# Patient Record
Sex: Male | Born: 1945 | Race: Black or African American | Hispanic: No | Marital: Single | Smoking: Former smoker
Health system: Southern US, Community
[De-identification: ages and names within clinical notes are randomized; demographics above are authoritative.]

## PROBLEM LIST (undated history)

## (undated) DIAGNOSIS — I10 Essential (primary) hypertension: Secondary | ICD-10-CM

---

## 2013-11-19 ENCOUNTER — Inpatient Hospital Stay: Payer: Self-pay | Admitting: Internal Medicine

## 2013-11-19 LAB — COMPREHENSIVE METABOLIC PANEL
Albumin: 3.6 g/dL (ref 3.4–5.0)
Alkaline Phosphatase: 121 U/L — ABNORMAL HIGH
Anion Gap: 5 — ABNORMAL LOW (ref 7–16)
BUN: 7 mg/dL (ref 7–18)
Bilirubin,Total: 0.6 mg/dL (ref 0.2–1.0)
CALCIUM: 8.8 mg/dL (ref 8.5–10.1)
CHLORIDE: 106 mmol/L (ref 98–107)
CREATININE: 1.13 mg/dL (ref 0.60–1.30)
Co2: 29 mmol/L (ref 21–32)
EGFR (Non-African Amer.): >60
GLUCOSE: 118 mg/dL — AB (ref 65–99)
OSMOLALITY: 278 (ref 275–301)
Potassium: 5.4 mmol/L — ABNORMAL HIGH (ref 3.5–5.1)
SGOT(AST): 30 U/L (ref 15–37)
SGPT (ALT): 19 U/L
Sodium: 140 mmol/L (ref 136–145)
Total Protein: 8 g/dL (ref 6.4–8.2)

## 2013-11-19 LAB — CBC WITH DIFFERENTIAL/PLATELET
BASOS ABS: 0 10*3/uL (ref 0.0–0.1)
BASOS PCT: 0.4 %
EOS ABS: 0 10*3/uL (ref 0.0–0.7)
Eosinophil %: 0.2 %
HCT: 47.4 % (ref 40.0–52.0)
HGB: 15.3 g/dL (ref 13.0–18.0)
Lymphocyte #: 0.9 10*3/uL — ABNORMAL LOW (ref 1.0–3.6)
Lymphocyte %: 15.8 %
MCH: 31.3 pg (ref 26.0–34.0)
MCHC: 32.2 g/dL (ref 32.0–36.0)
MCV: 97 fL (ref 80–100)
Monocyte #: 0.6 x10 3/mm (ref 0.2–1.0)
Monocyte %: 11.5 %
NEUTROS ABS: 4 10*3/uL (ref 1.4–6.5)
NEUTROS PCT: 72.1 %
PLATELETS: 229 10*3/uL (ref 150–440)
RBC: 4.89 10*6/uL (ref 4.40–5.90)
RDW: 16.9 % — ABNORMAL HIGH (ref 11.5–14.5)
WBC: 5.5 10*3/uL (ref 3.8–10.6)

## 2013-11-19 LAB — URIC ACID: Uric Acid: 8.5 mg/dL — ABNORMAL HIGH (ref 3.5–7.2)

## 2013-11-19 LAB — SEDIMENTATION RATE: Erythrocyte Sed Rate: 5 mm/hr (ref 0–20)

## 2013-11-20 LAB — BASIC METABOLIC PANEL
Anion Gap: 8 (ref 7–16)
BUN: 8 mg/dL (ref 7–18)
Calcium, Total: 8.8 mg/dL (ref 8.5–10.1)
Chloride: 104 mmol/L (ref 98–107)
Co2: 28 mmol/L (ref 21–32)
Creatinine: 0.91 mg/dL (ref 0.60–1.30)
EGFR (Non-African Amer.): >60
Glucose: 83 mg/dL (ref 65–99)
Osmolality: 277 (ref 275–301)
Potassium: 4 mmol/L (ref 3.5–5.1)
SODIUM: 140 mmol/L (ref 136–145)

## 2013-11-20 LAB — CBC WITH DIFFERENTIAL/PLATELET
BASOS ABS: 0 10*3/uL (ref 0.0–0.1)
Basophil %: 0.7 %
Eosinophil #: 0 10*3/uL (ref 0.0–0.7)
Eosinophil %: 0.5 %
HCT: 44.6 % (ref 40.0–52.0)
HGB: 14.6 g/dL (ref 13.0–18.0)
Lymphocyte #: 1.6 10*3/uL (ref 1.0–3.6)
Lymphocyte %: 30.3 %
MCH: 32.1 pg (ref 26.0–34.0)
MCHC: 32.9 g/dL (ref 32.0–36.0)
MCV: 98 fL (ref 80–100)
Monocyte #: 0.7 x10 3/mm (ref 0.2–1.0)
Monocyte %: 13.5 %
NEUTROS ABS: 2.8 10*3/uL (ref 1.4–6.5)
Neutrophil %: 55 %
Platelet: 218 10*3/uL (ref 150–440)
RBC: 4.56 10*6/uL (ref 4.40–5.90)
RDW: 17.2 % — AB (ref 11.5–14.5)
WBC: 5.1 10*3/uL (ref 3.8–10.6)

## 2013-11-20 LAB — LIPID PANEL
CHOLESTEROL: 172 mg/dL (ref 0–200)
HDL: 70 mg/dL — AB (ref 40–60)
Ldl Cholesterol, Calc: 79 mg/dL (ref 0–100)
Triglycerides: 116 mg/dL (ref 0–200)
VLDL Cholesterol, Calc: 23 mg/dL (ref 5–40)

## 2013-11-20 LAB — HEMOGLOBIN A1C: Hemoglobin A1C: 5.2 % (ref 4.2–6.3)

## 2013-11-24 LAB — CREATININE, SERUM
Creatinine: 1.18 mg/dL (ref 0.60–1.30)
EGFR (African American): >60
EGFR (Non-African Amer.): >60

## 2014-05-09 NOTE — Op Note (Signed)
PATIENT NAME:  Tristan [name], Tristan [name] MR#:  [id number] DATE OF BIRTH:  1945/09/15  DATE OF PROCEDURE:  11/24/2013   PREOPERATIVE DIAGNOSES:  1.  Peripheral arterial disease with gangrene, right lower extremity. 2.  Hypertension.  POSTOPERATIVE DIAGNOSES:    1.  Peripheral arterial disease with gangrene, right lower extremity. 2.  Hypertension.  PROCEDURE:   1.  Ultrasound guidance for vascular access, left femoral artery.  2.  Catheter placement into right peroneal and posterior tibial arteries from left femoral approach.  3.  Aortogram and selective right lower extremity angiogram.  4.  Percutaneous transluminal angioplasty of right peroneal artery with 3 mm diameter angioplasty balloon.  4.  Percutaneous transluminal angioplasty of right posterior tibial artery with 3 mm diameter angioplasty balloon.  5.  Percutaneous transluminal angioplasty of tibioperoneal trunk and proximal peroneal artery with 4 mm diameter drug-coated angioplasty balloon.  6.  Percutaneous transluminal angioplasty of right distal superficial femoral artery and popliteal artery with 5 mm diameter drug-coated angioplasty balloon.  7.  Viabahn covered stent placement, 6 mm diameter x 25 cm in length for significant residual stenosis and dissection after angioplasty in the distal superficial femoral artery and popliteal artery.  8.  StarClose closure device, left femoral artery.   SURGEON: Algernon Huxley, MD.   ANESTHESIA: Local with moderate conscious sedation.   ESTIMATED BLOOD LOSS: 25 mL.   INDICATION FOR PROCEDURE: A 69 year old African American gentleman with gangrenous toes on his right foot. His clinical exam was consistent with peripheral arterial disease with diminished pulses. He is brought in for angiography, further evaluation and potential treatment. Risks and benefits were discussed. Informed consent was obtained.   DESCRIPTION OF PROCEDURE: The patient is brought to the vascular suite. Groins were shaved and  prepped and a sterile surgical field was created. The left femoral head is visualized with ultrasound.  The left femoral head was visualized with fluoroscopy. The left femoral artery was then visualized with ultrasound and found to be patent. It was then accessed under direct ultrasound guidance without difficulty with Seldinger needle and a permanent image was recorded.  A J-wire and 5 French sheath were then placed.  Pigtail catheter was placed in the aorta at the L1-L2 level and AP aortogram was performed. This showed what appeared to be good flow in the renal arteries. The aorta and iliac were diffusely calcified, but not stenotic.  I then crossed the aortic bifurcation and advanced to the right femoral head and selective right lower extremity angiogram was then performed. This showed about a 30% to 40% stenosis in the proximal superficial femoral artery that was not flow-limiting.  The mid superficial femoral artery was widely patent.  The distal superficial femoral artery was occluded and there was a medium length chronic total occlusion reconstituted in the popliteal artery above the knee.  Below this level, there was multiple areas of significant stenosis within the popliteal artery and the tibial vessels were quite diseased.  The patient was given 4000 units of intravenous heparin for systemic anticoagulation and a 6 French Ansel sheath was placed over a Terumo Advantage wire.  A Kumpe catheter and a CXI catheter were used to cross the occlusion in the SFA and popliteal arteries, and regain access into the tibioperoneal trunk, then used a 0.018 wire to cross the diseased areas initially in the peroneal artery and park a wire distally in the peroneal artery. The peroneal artery and tibioperoneal trunk were treated with 3 mm diameter angioplasty balloon.  I pretreated  the SFA and popliteal lesion with a 3 mm balloon as this was heavily calcific and the catheter did not cross easily. The SFA and popliteal  lesion were then treated with a 5 mm diameter Lutonix drug-coated angioplasty balloon and the tibioperoneal trunk was still heavily diseased and so a 4 mm diameter Lutonix drug-coated angioplasty balloon was used to treat the tibioperoneal trunk into proximal peroneal artery. Following this, there was still significant areas of stenosis and a spiral dissection within the distal SFA and above-knee popliteal artery and flow was not brisk distally.  The popliteal artery and SFA, I elected to cover these areas with covered stent, but I went ahead and got my 0.018 wire into the posterior tibial artery. This was the better runoff distally, it but had a near occlusive stenosis proximally and disease over the first 8 to 10 cm. I was able to cross this lesion in the posterior tibial artery and confirm intraluminal flow in the posterior tibial artery distally. I then replaced the 0.018 wire.  The 3 mm diameter angioplasty balloon was inflated in the mid and proximal posterior tibial artery up into the tibioperoneal trunk which there was a dissection after angioplasty but the flow was significantly improved.  To treat the popliteal and SFA residual stenosis and dissection, a 6 mm diameter x 25 cm Viabahn covered stent was used, post-dilated with a 5 mm and then a 6 mm balloon with good angiographic completion result and significantly improved flow. At this point, the patient's perfusion had been improved.  We treated 2 tibial vessels, as well as the occlusion in the SFA and popliteal artery and his flow was much better.  I elected to terminate the procedure.  The sheath was pulled back to the ipsilateral external iliac, and oblique arteriogram was performed.  StarClose closure device was deployed in the usual fashion with excellent hemostatic result. The patient tolerated the procedure well and was taken to the recovery room in stable condition.     ____________________________ Algernon Huxley, MD jsd:DT D: 11/24/2013  12:48:48 ET T: 11/24/2013 13:40:18 ET JOB#: [id number]  cc: Algernon Huxley, MD, <Dictator> Algernon Huxley MD ELECTRONICALLY SIGNED 12/01/2013 10:12

## 2014-05-09 NOTE — H&P (Signed)
PATIENT NAME:  Tristan [name], Tristan [name] MR#:  [id number] DATE OF BIRTH:  [birth year]  DATE OF ADMISSION:  11/19/2013  PRIMARY CARE PHYSICIAN: None.   CHIEF COMPLAINT: Pain in the toes.   HISTORY OF PRESENT ILLNESS: This is a 69 year old man with history of gout. He thought he had a gout attack and could hardly walk a couple of weeks ago, having pain in the feet. He has been taking as needed ibuprofen. His toes turned black about a week ago. He decided to come into the ER today; stated that he needed to pay some bills prior to getting evaluated for the black on the toes. In the ER, he was found to have a normal white count and he was afebrile. His blood pressure was found to be severely elevated 191/121. Hospitalist services were contacted for further evaluation.   PAST MEDICAL HISTORY: Possible gout, arthritis, and hip pain.   PAST SURGICAL HISTORY: Cyst on the head.   ALLERGIES: No known drug allergies.   MEDICATIONS: Include as needed ibuprofen.   SOCIAL HISTORY: No smoking. Does drink beer, 1 to 2 every other day. No drug use. He does live alone.   FAMILY HISTORY: Father, unknown medical history. Mother did not have many medical issues. Did have hypertension, and she did not eat, and she died of starvation.   REVIEW OF SYSTEMS:  CONSTITUTIONAL: Positive for chills. Positive for sweats. Positive for weight loss 30 pounds. No weakness or fatigue.  EYES: He does wear glasses.  EARS, NOSE, MOUTH AND THROAT: No hearing loss. No sore throat. No difficulty swallowing.  CARDIOVASCULAR: No chest pain. No palpitations.   RESPIRATORY: No shortness of breath. No cough. No sputum. No hemoptysis.  GASTROINTESTINAL: Positive for vomiting 3 weeks ago. No abdominal pain. No diarrhea. No constipation. No bright red blood per rectum. No melena.  GENITOURINARY: No burning on urination or hematuria.  MUSCULOSKELETAL: Positive for hip pain and toe pain.  INTEGUMENTARY: Positive for gangrene on the second toe and tip  of the right first toe.  NEUROLOGIC: No fainting or blackouts.  PSYCHIATRIC: No anxiety or depression.  ENDOCRINE: No thyroid problems.  HEMATOLOGIC AND LYMPHATIC: No anemia, no easy bruising or bleeding.   PHYSICAL EXAMINATION: VITAL SIGNS: Temperature 98.2, pulse 114, respirations 19, blood pressure 191/121, pulse oximetry 100% on room air.  GENERAL: No respiratory distress.  EYES: Conjunctivae and lids normal. Pupils are equal, round, and reactive to light. Extraocular muscles intact. No nystagmus.  EARS, NOSE, MOUTH AND THROAT: Tympanic membranes: No erythema. Nasal mucosa: No erythema. Throat: No erythema, no exudate seen. Lips and gums: No lesions.  NECK: No JVD. No bruits. No lymphadenopathy. No thyromegaly. No thyroid nodules palpated.  LUNGS: Clear to auscultation. No use of accessory muscles to breathe. No rhonchi, rales, or wheeze heard.  CARDIOVASCULAR: S1, S2 normal. No gallops, rubs, or murmurs heard. Carotid upstroke 2+ bilaterally. No bruits. Dorsalis pedis unable to palpate.  ABDOMEN: Soft, nontender. No organomegaly/splenomegaly. Normoactive bowel sounds. No masses felt.  LYMPHATIC: No lymph nodes in the neck.  MUSCULOSKELETAL: No clubbing, edema, cyanosis.  SKIN: Gangrene, top of the second toe and the tip of the first toe. No other lesions seen.  NEUROLOGIC: Cranial nerves II through XII grossly intact. Deep tendon reflexes 1+ bilateral lower extremities.  PSYCHIATRIC: The patient is oriented to person, place, and time.   LABORATORY AND RADIOLOGICAL DATA: Foot x-Alekai shows incomplete fracture along the lateral aspect of the proximal portion of the left first proximal phalanx. No other fracture.  No dislocation.  Multiple erosions on the first MTP joint consistent with gout. Osteoarthritic change in the PIP and DIP joints. White blood cell count 5.5, H and H 15.3 and 47.4, platelet count of 229,000, glucose 118, BUN 7, creatinine 1.13, sodium 140, potassium 5.4, chloride 106,  CO2 of 29, calcium 8.8, alkaline phosphatase 121, ALT 19, AST 30, total protein 8, albumin 3.6. EKG:  First EKG showed sinus tachycardia, left atrial enlargement.  Next EKG: Normal sinus rhythm, 85 beats per minute.   ASSESSMENT AND PLAN: 1.  Gangrene on the top of the second toe and tip of the first toe with peripheral vascular disease. Unable to palpate pulses.  I will check an ESR, obtain podiatry consultation for possible debridement versus amputation. I will obtain a vascular surgery consultation to assess circulation with possible angiogram. We will hold off on anti-inflammatories at this point.  2.  Accelerated hypertension.  A stat dose of hydrochlorothiazide was ordered in the ER. Will continue that on a daily basis and along with a low dose metoprolol and continue to monitor blood pressure.  Blood pressure has been elevated for a long time without medication so I do not want to bring it down too quickly.  3.  History of gout with gout showing on the x-Akeen. I will give a dose of colchicine stat and see if that makes a difference.  4.  History of arthritis. Will send off a rheumatoid factor.  5.  Hyperkalemia. We will give a liter of fluid and recheck in a.m.  6.  Impaired fasting glucose. We will check hemoglobin A1c in the a.m. and will also put on sliding scale for right now and check sugars a little more closely.   TIME SPENT ON ADMISSION: 55 minutes.   ER physician spoke with Dr. Cleda Mccreedy, podiatry. I spoke with Dr. Nino Parsley, vascular surgeon, nurse for consultation.    ____________________________ Tana Conch. Leslye Peer, MD rjw:jp D: 11/19/2013 14:54:34 ET T: 11/19/2013 15:18:04 ET JOB#: [id number]  cc: Tana Conch. Leslye Peer, MD, <Dictator> Marisue Brooklyn MD ELECTRONICALLY SIGNED 11/27/2013 1:36

## 2014-05-09 NOTE — Discharge Summary (Signed)
PATIENT NAME:  Tristan [name], FOTI MR#:  [id number] DATE OF BIRTH:  14-Sep-1945  DATE OF ADMISSION:  11/19/2013 DATE OF DISCHARGE:    PRIMARY CARE PHYSICIAN:  Non-local.    DISCHARGE DIAGNOSES:   1.  Peripheral arterial disease right lower extremity, gangrene.   2.  Hypertension.   CONDITION: Stable.   CODE STATUS: Full code.   PROCEDURE:   Abdominal aortogram with stent placement.  HOME MEDICATIONS: Please refer to the medication reconciliation list.   DIET: Low-sodium, low-fat, low-cholesterol diet.   ACTIVITY: As tolerated.   FOLLOWUP CARE: Follow with PCP within 1-2 weeks. Follow up with Dr. Lucky Cowboy within 2-4  weeks for ABI.   REASON FOR ADMISSION: Pain in the toes.     HOSPITAL COURSE: The patient is a 69 year old African-American male with a history of gout, arthritis, and hip pain, who presented to the ED with pain in toes. In addition the patient's toes became black for 1 week. The patient was found to have a high blood pressure at 191/121 in the ed. For detailed history and physical examination please refer to the admission note dictated by Dr. Leslye Peer. On admission date the patient's foot x-Alann showed incomplete fracture along the lateral aspect of the proximal portion of the left first proximal phalanx. The patient's WBC 5.5, hemoglobin 15.3.  After admission for the gangrene on the top of the second toe and tip of the first toe with PAD, since the patient had no pedal pulses Dr. Leslye Peer requested vascular surgery consult and podiatry consult. Dr. Delana Meyer suggested angiogram, which was done today by Dr. Lucky Cowboy.  Dr. Lucky Cowboy put a stent on the right lower extremity due to PAD, he suggested to continue aspirin and add Plavix follow up him as outpatient The patient has been treated with aspirin for PAD.   Hypertension. The patient has been treated with Lopressor, HCTZ-lisinopril in the hospital, blood pressure has been controlled.   The patient has no complaints. He is clinically stable and he  will be discharged home today. I discussed the patient's discharge plan with the patient, nurse, case manager, and Dr. Lucky Cowboy.   TIME SPENT: About 38 minutes.    ____________________________ Demetrios Loll, MD qc:bu D: 11/24/2013 13:16:20 ET T: 11/24/2013 14:14:00 ET JOB#: [id number]  cc: Demetrios Loll, MD, <Dictator> Demetrios Loll MD ELECTRONICALLY SIGNED 11/24/2013 16:58

## 2014-05-09 NOTE — Consult Note (Signed)
General Aspect gangrene of the right second toe   Present Illness The patient is a 69 year old man with history of gout. He thought he had a gout attack and could hardly walk several weeks ago.  This was associated with severe pain in his right foot. His right second toes turned black about a week ago.  In the ER, he was found to have a normal white count and he was afebrile. His blood pressure was found to be severely elevated 191/121. He states that in addition to the trouble with gout he knows he has blockages in his legs.  He notes he has never had any treatments for his PAD.  PAST MEDICAL HISTORY: Possible gout, arthritis, and hip pain.   PAST SURGICAL HISTORY: Cyst on the head.   Home Medications: Medication Instructions Status  ibuprofen 200 mg oral tablet 1 tab(s) orally every 6 hours, As Needed Active    No Known Allergies:   Case History:  Family History Non-Contributory   Social History negative tobacco, positive ETOH, negative Illicit drugs   Review of Systems:  Fever/Chills No   Cough No   Sputum No   Abdominal Pain No   Diarrhea No   Constipation No   Nausea/Vomiting No   SOB/DOE No   Chest Pain No   Telemetry Reviewed NSR   Dysuria No   Physical Exam:  GEN well developed, well nourished, no acute distress   HEENT hearing intact to voice, moist oral mucosa   NECK supple  trachea midline   RESP normal resp effort  no use of accessory muscles   CARD regular rate  no JVD   ABD denies tenderness  soft   EXTR negative edema, pedal and popliteal pulses are not palpable, feet are cool with slugish cap refill and gangrene of the 2nd toe   SKIN No rashes, positive ulcers   NEURO cranial nerves intact, follows commands   PSYCH alert, A+O to time, place, person   Nursing/Ancillary Notes: **Vital Signs.:   04-Nov-15 19:52  Vital Signs Type Q 4hr  Temperature Temperature (F) 98.7  Celsius 37  Temperature Source oral  Pulse Pulse 100   Respirations Respirations 18  Systolic BP Systolic BP 161  Diastolic BP (mmHg) Diastolic BP (mmHg) 94  Mean BP 124  Pulse Ox % Pulse Ox % 98  Pulse Ox Activity Level  At rest  Oxygen Delivery Room Air/ 21 %   Hepatic:  04-Nov-15 12:47   Bilirubin, Total 0.6  Alkaline Phosphatase  121 (46-116 NOTE: New Reference Range 08/05/13)  SGPT (ALT) 19 (14-63 NOTE: New Reference Range 08/05/13)  SGOT (AST) 30  Total Protein, Serum 8.0  Albumin, Serum 3.6  Routine Chem:  04-Nov-15 12:47   Uric Acid, Serum  8.5 (Result(s) reported on 19 Nov 2013 at 03:15PM.)  Glucose, Serum  118  BUN 7  Creatinine (comp) 1.13  Sodium, Serum 140  Potassium, Serum  5.4  Chloride, Serum 106  CO2, Serum 29  Calcium (Total), Serum 8.8  Osmolality (calc) 278  eGFR (African American) >60  eGFR (Non-African American) >60 (eGFR values <11m/min/1.73 m2 may be an indication of chronic kidney disease (CKD). Calculated eGFR, using the MRDR Study equation, is useful in  patients with stable renal function. The eGFR calculation will not be reliable in acutely ill patients when serum creatinine is changing rapidly. It is not useful in patients on dialysis. The eGFR calculation may not be applicable to patients at the low and high extremes of  body sizes, pregnant women, and vegetarians.)  Anion Gap  5  Routine Hem:  04-Nov-15 12:47   Erythrocyte Sed Rate 5 (Result(s) reported on 19 Nov 2013 at 03:49PM.)  WBC (CBC) 5.5  RBC (CBC) 4.89  Hemoglobin (CBC) 15.3  Hematocrit (CBC) 47.4  Platelet Count (CBC) 229  MCV 97  MCH 31.3  MCHC 32.2  RDW  16.9  Neutrophil % 72.1  Lymphocyte % 15.8  Monocyte % 11.5  Eosinophil % 0.2  Basophil % 0.4  Neutrophil # 4.0  Lymphocyte #  0.9  Monocyte # 0.6  Eosinophil # 0.0  Basophil # 0.0 (Result(s) reported on 19 Nov 2013 at 01:20PM.)   XRay:    04-Nov-15 13:26, Foot Right Complete  Foot Right Complete   REASON FOR EXAM:    2nd digit decrotic tissue  COMMENTS:        PROCEDURE: DXR - DXR FOOT RT COMPLETE W/OBLIQUES  - Nov 19 2013  1:26PM     CLINICAL DATA:  Patient with history of gout; trauma to first toe    EXAM:  RIGHT FOOT COMPLETE - 3+ VIEW    COMPARISON:  None.    FINDINGS:  Frontal, oblique, and lateral views were obtained. There is  extensive erosion in the first MTP joint consistent with known gout.  There is an incomplete fracture along the proximal portion of the  lateral aspect of the first proximal phalanx. No other evidence of  fracture. No dislocation there is moderate narrowing of all PIP and  DIP joints. Bones are osteoporotic.     IMPRESSION:  Incomplete fracture along the lateral aspect of the proximal portion  of the first proximal phalanx. No other fracture. No dislocation.  Multiple erosions first MTP joint consistent with gout.  Osteoarthritic change in all PIP and DIP joints.      Electronically Signed    By: Lowella Grip M.D.    On: 11/19/2013 13:33     Verified By: Leafy Kindle. WOODRUFF, M.D.,    Impression 1.  Gangrene on the top of the second toe and tip of the first toe with peripheral vascular disease.  The patient has tissue loss and evidence of ASO bilaterally.  He shuld have an angiogram for revascularization to prevent loss of his leg.  The risks and benfits were discussed and he agrees to proceed. Plans will be made for angiogram on Friday. 2.  Accelerated hypertension.  A stat dose of hydrochlorothiazide was ordered in the ER. Will continue that on a daily basis and along with a low dose metoprolol and continue to monitor blood pressure.  Blood pressure has been elevated for a long time without medication so I do not want to bring it down too quickly.  3.  History of gout with gout showing on the x-Karla. I will give a dose of colchicine stat and see if that makes a difference.  4.  History of arthritis. Will send off a rheumatoid factor.  5.  Hyperkalemia. We will give a liter of fluid and recheck in  a.m.  6.  Impaired fasting glucose. We will check hemoglobin A1c in the a.m. and will also put on sliding scale for right now and check sugars a little more closely.   Plan level 4 consult   Electronic Signatures: Hortencia Pilar (MD)  (Signed [phone] 22:33)  Authored: General Aspect/Present Illness, Home Medications, Allergies, History and Physical Exam, Vital Signs, Labs, Radiology, Impression/Plan   Last Updated: 04-Nov-15 22:33 by Hortencia Pilar (MD)

## 2014-05-09 NOTE — Consult Note (Signed)
PATIENT NAME:  Tristan [name], Tristan [name] MR#:  [id number] DATE OF BIRTH:  December 19, 1945  DATE OF CONSULTATION:  11/19/2013  CONSULTING PHYSICIAN:  Sharlotte Alamo, DPM  REASON FOR CONSULTATION:  This is a 69 year old man with a recent history of some pain and discoloration in his right foot, specifically out into the first and second toes. A couple of weeks ago, he thought he was having a gout attack because he could hardly walk and then about a week ago started to notice some discoloration on his first and second toes. He denies any specific injury to the toes. He does not relate any new pair of shoes or other incident that may have started this. He did present to the Emergency Department earlier today, where he was diagnosed with some gangrenous toes. On admission, he was found to have a normal white count, and he was afebrile.   PAST MEDICAL HISTORY:  Arthritis, hip pain, gout.   PAST SURGICAL HISTORY:  Tonsillectomy as a child, cyst on his head.   MEDICATIONS:  Ibuprofen as needed.   ALLERGIES:  Denies any drug allergies.   SOCIAL HISTORY:  He does drink some beer 1 to 2 every other day. Denies tobacco use. He lives alone. Denies drug use.   FAMILY HISTORY:  Father's history is unknown. Mother did have hypertension.   REVIEW OF SYSTEMS:   CONSTITUTIONAL:  He does relate some chills and sweats. Significant weight loss recently.  EYES:  Denies blurry or double vision. He does wear glasses.  EARS, NOSE, AND THROAT:  Denies hearing loss, tinnitus, or nasal congestion.  RESPIRATORY:  Denies cough or shortness of breath.  CARDIOVASCULAR:  No chest pains or palpitations.  GASTROINTESTINAL:  No current nausea or vomiting. Denies abdominal pain.  GENITOURINARY:  No dysuria or incontinence.  MUSCULOSKELETAL:  Pain in his right foot. Also has some chronic hip pain.  INTEGUMENT:  Discoloration on his right first and second toes. Denies any injury.  NEUROLOGICAL:  Denies any numbness or paresthesias.   PHYSICAL  EXAMINATION: VASCULAR:  DP and PT pulses are faintly palpable and thready. PT pulse is also faintly palpable but becomes absent after a couple of seconds; it is also thready in nature. Capillary filling time is still intact to the digits.  NEUROLOGICAL:  Protective threshold is intact bilaterally as well as proprioception.  INTEGUMENT:  Skin is warm, dry, and mildly atrophic. There is scant hair growth on the lesser digits. A full-thickness ulceration is present on the dorsal aspect of the right second toe with some underlying fibrotic tissue. No expressible purulence is noted. No ascending cellulitis. Some skin slough is noted dorsally and distally around the tip of the toe. An intact hemorrhagic lesion is noted at the tip of the hallux with no evidence of any abscess or fluid.  MUSCULOSKELETAL:  Adequate range of motion of the pedal joints. Muscle testing is 5/5. There is some pain on any palpation around the second toe.   X-RAYS:  Multiple views of the right foot revealed arthritic changes at the great toe joint consistent with gouty erosions. There also appeared to be a fracture line noted in the proximal phalanx. This was only clearly noted on one view. No clear evidence of any bony destruction or erosions in the second toe.   LABORATORY DATA:  White count 5.5, well in the normal range. No evidence of a left shift at this point.   IMPRESSION:  1.  A full-thickness ulceration, right second toe.  2.  Possibly  some degree of vascular compromise.  3.  Intact hemorrhagic lesion, distal hallux, right.   PLAN:  Excisional debridement of the devitalized tissue on the second toe sharply using a 10 blade and tissue nippers. Polysporin and a sterile bandage were applied. We will wait for his vascular evaluation, as he most likely has some degree of vascular compromise. At this point, it does not appear to be clinically infected, and an attempt at local wound care and possible revascularization if needed  could be tried. At this point, it does not specifically look gangrenous, and I do not feel that we need an emergent type of amputation. We will follow the patient accordingly during his hospital stay and wait for Vascular's assessment.    ____________________________ Sharlotte Alamo, DPM tc:nb D: 11/19/2013 21:36:35 ET T: 11/19/2013 21:59:45 ET JOB#: [id number]  cc: Sharlotte Alamo, DPM, <Dictator> Ellicia Alix DPM ELECTRONICALLY SIGNED 11/21/2013 13:57

## 2016-06-08 ENCOUNTER — Encounter: Payer: Self-pay | Admitting: Emergency Medicine

## 2016-06-08 ENCOUNTER — Inpatient Hospital Stay
Admission: EM | Admit: 2016-06-08 | Discharge: 2016-06-12 | DRG: 280 | Disposition: A | Discharge status: Skilled Nursing Facility | Payer: Medicare Other | Attending: Internal Medicine | Admitting: Internal Medicine

## 2016-06-08 ENCOUNTER — Inpatient Hospital Stay: Payer: Medicare Other

## 2016-06-08 ENCOUNTER — Emergency Department: Payer: Medicare Other

## 2016-06-08 DIAGNOSIS — C3431 Malignant neoplasm of lower lobe, right bronchus or lung: Secondary | ICD-10-CM

## 2016-06-08 DIAGNOSIS — M109 Gout, unspecified: Secondary | ICD-10-CM | POA: Diagnosis not present

## 2016-06-08 DIAGNOSIS — K7200 Acute and subacute hepatic failure without coma: Secondary | ICD-10-CM | POA: Diagnosis present

## 2016-06-08 DIAGNOSIS — R5383 Other fatigue: Secondary | ICD-10-CM | POA: Diagnosis not present

## 2016-06-08 DIAGNOSIS — D649 Anemia, unspecified: Secondary | ICD-10-CM

## 2016-06-08 DIAGNOSIS — N289 Disorder of kidney and ureter, unspecified: Secondary | ICD-10-CM | POA: Diagnosis not present

## 2016-06-08 DIAGNOSIS — R350 Frequency of micturition: Secondary | ICD-10-CM | POA: Diagnosis not present

## 2016-06-08 DIAGNOSIS — R627 Adult failure to thrive: Secondary | ICD-10-CM | POA: Diagnosis not present

## 2016-06-08 DIAGNOSIS — E119 Type 2 diabetes mellitus without complications: Secondary | ICD-10-CM | POA: Diagnosis not present

## 2016-06-08 DIAGNOSIS — I5020 Unspecified systolic (congestive) heart failure: Secondary | ICD-10-CM | POA: Diagnosis present

## 2016-06-08 DIAGNOSIS — E785 Hyperlipidemia, unspecified: Secondary | ICD-10-CM | POA: Diagnosis not present

## 2016-06-08 DIAGNOSIS — R7989 Other specified abnormal findings of blood chemistry: Secondary | ICD-10-CM | POA: Diagnosis not present

## 2016-06-08 DIAGNOSIS — I110 Hypertensive heart disease with heart failure: Secondary | ICD-10-CM | POA: Diagnosis present

## 2016-06-08 DIAGNOSIS — Z79899 Other long term (current) drug therapy: Secondary | ICD-10-CM | POA: Diagnosis not present

## 2016-06-08 DIAGNOSIS — I429 Cardiomyopathy, unspecified: Secondary | ICD-10-CM | POA: Diagnosis present

## 2016-06-08 DIAGNOSIS — I214 Non-ST elevation (NSTEMI) myocardial infarction: Secondary | ICD-10-CM | POA: Diagnosis not present

## 2016-06-08 DIAGNOSIS — R1310 Dysphagia, unspecified: Secondary | ICD-10-CM

## 2016-06-08 DIAGNOSIS — J9600 Acute respiratory failure, unspecified whether with hypoxia or hypercapnia: Secondary | ICD-10-CM

## 2016-06-08 DIAGNOSIS — E860 Dehydration: Secondary | ICD-10-CM | POA: Diagnosis present

## 2016-06-08 DIAGNOSIS — R571 Hypovolemic shock: Secondary | ICD-10-CM | POA: Diagnosis present

## 2016-06-08 DIAGNOSIS — R0602 Shortness of breath: Secondary | ICD-10-CM | POA: Diagnosis not present

## 2016-06-08 DIAGNOSIS — S36119A Unspecified injury of liver, initial encounter: Secondary | ICD-10-CM

## 2016-06-08 DIAGNOSIS — Z87891 Personal history of nicotine dependence: Secondary | ICD-10-CM

## 2016-06-08 DIAGNOSIS — R110 Nausea: Secondary | ICD-10-CM | POA: Diagnosis not present

## 2016-06-08 DIAGNOSIS — N390 Urinary tract infection, site not specified: Secondary | ICD-10-CM | POA: Diagnosis present

## 2016-06-08 DIAGNOSIS — D509 Iron deficiency anemia, unspecified: Secondary | ICD-10-CM | POA: Diagnosis present

## 2016-06-08 DIAGNOSIS — R944 Abnormal results of kidney function studies: Secondary | ICD-10-CM | POA: Diagnosis not present

## 2016-06-08 DIAGNOSIS — F5089 Other specified eating disorder: Secondary | ICD-10-CM | POA: Diagnosis not present

## 2016-06-08 DIAGNOSIS — T68XXXA Hypothermia, initial encounter: Secondary | ICD-10-CM

## 2016-06-08 DIAGNOSIS — Z923 Personal history of irradiation: Secondary | ICD-10-CM

## 2016-06-08 DIAGNOSIS — N179 Acute kidney failure, unspecified: Secondary | ICD-10-CM

## 2016-06-08 DIAGNOSIS — D720 Genetic anomalies of leukocytes: Secondary | ICD-10-CM | POA: Diagnosis not present

## 2016-06-08 DIAGNOSIS — I10 Essential (primary) hypertension: Secondary | ICD-10-CM

## 2016-06-08 DIAGNOSIS — Z8042 Family history of malignant neoplasm of prostate: Secondary | ICD-10-CM

## 2016-06-08 DIAGNOSIS — R748 Abnormal levels of other serum enzymes: Secondary | ICD-10-CM

## 2016-06-08 DIAGNOSIS — I2510 Atherosclerotic heart disease of native coronary artery without angina pectoris: Secondary | ICD-10-CM

## 2016-06-08 DIAGNOSIS — M8448XS Pathological fracture, other site, sequela: Secondary | ICD-10-CM | POA: Diagnosis not present

## 2016-06-08 DIAGNOSIS — Z8673 Personal history of transient ischemic attack (TIA), and cerebral infarction without residual deficits: Secondary | ICD-10-CM

## 2016-06-08 DIAGNOSIS — Z7982 Long term (current) use of aspirin: Secondary | ICD-10-CM | POA: Diagnosis not present

## 2016-06-08 DIAGNOSIS — I350 Nonrheumatic aortic (valve) stenosis: Secondary | ICD-10-CM | POA: Diagnosis not present

## 2016-06-08 HISTORY — DX: Essential (primary) hypertension: I10

## 2016-06-08 LAB — LACTATE DEHYDROGENASE: LDH: 1357 U/L — ABNORMAL HIGH (ref 98–192)

## 2016-06-08 LAB — VITAMIN B12: Vitamin B-12: 1122 pg/mL — ABNORMAL HIGH (ref 180–914)

## 2016-06-08 LAB — COMPREHENSIVE METABOLIC PANEL
ALT: 472 U/L — ABNORMAL HIGH (ref 17–63)
ANION GAP: 24 — AB (ref 5–15)
AST: 654 U/L — AB (ref 15–41)
Albumin: 3.8 g/dL (ref 3.5–5.0)
Alkaline Phosphatase: 67 U/L (ref 38–126)
BUN: 43 mg/dL — AB (ref 6–20)
CHLORIDE: 104 mmol/L (ref 101–111)
CO2: 7 mmol/L — ABNORMAL LOW (ref 22–32)
Calcium: 9 mg/dL (ref 8.9–10.3)
Creatinine, Ser: 2.8 mg/dL — ABNORMAL HIGH (ref 0.61–1.24)
GFR calc Af Amer: 25 mL/min — ABNORMAL LOW (ref >60)
GFR, EST NON AFRICAN AMERICAN: 21 mL/min — AB (ref >60)
Glucose, Bld: 184 mg/dL — ABNORMAL HIGH (ref 65–99)
POTASSIUM: 5.1 mmol/L (ref 3.5–5.1)
Sodium: 135 mmol/L (ref 135–145)
Total Bilirubin: 2.5 mg/dL — ABNORMAL HIGH (ref 0.3–1.2)
Total Protein: 7.8 g/dL (ref 6.5–8.1)

## 2016-06-08 LAB — CBC WITH DIFFERENTIAL/PLATELET
BASOS ABS: 0.1 10*3/uL (ref 0–0.1)
BASOS PCT: 1 %
EOS PCT: 0 %
Eosinophils Absolute: 0 10*3/uL (ref 0–0.7)
HCT: 15.3 % — ABNORMAL LOW (ref 40.0–52.0)
HEMOGLOBIN: 3.7 g/dL — AB (ref 13.0–18.0)
LYMPHS ABS: 0.9 10*3/uL — AB (ref 1.0–3.6)
Lymphocytes Relative: 6 %
MCH: 15 pg — ABNORMAL LOW (ref 26.0–34.0)
MCHC: 24.4 g/dL — AB (ref 32.0–36.0)
MCV: 61.6 fL — ABNORMAL LOW (ref 80.0–100.0)
MONOS PCT: 8 %
Monocytes Absolute: 1.2 10*3/uL — ABNORMAL HIGH (ref 0.2–1.0)
NEUTROS PCT: 85 %
Neutro Abs: 12.6 10*3/uL — ABNORMAL HIGH (ref 1.4–6.5)
Platelets: 425 10*3/uL (ref 150–440)
RBC: 2.49 MIL/uL — ABNORMAL LOW (ref 4.40–5.90)
RDW: 23.7 % — ABNORMAL HIGH (ref 11.5–14.5)
WBC: 14.8 10*3/uL — ABNORMAL HIGH (ref 3.8–10.6)
nRBC: 8 /100 WBC — ABNORMAL HIGH

## 2016-06-08 LAB — HEMOGLOBIN AND HEMATOCRIT, BLOOD
HCT: 21 % — ABNORMAL LOW (ref 40.0–52.0)
HCT: 24.9 % — ABNORMAL LOW (ref 40.0–52.0)
HEMOGLOBIN: 5.8 g/dL — AB (ref 13.0–18.0)
Hemoglobin: 7.6 g/dL — ABNORMAL LOW (ref 13.0–18.0)

## 2016-06-08 LAB — URIC ACID: Uric Acid, Serum: 15.4 mg/dL — ABNORMAL HIGH (ref 4.4–7.6)

## 2016-06-08 LAB — URINALYSIS, COMPLETE (UACMP) WITH MICROSCOPIC
BILIRUBIN URINE: NEGATIVE
Glucose, UA: NEGATIVE mg/dL
KETONES UR: NEGATIVE mg/dL
Nitrite: NEGATIVE
PROTEIN: 30 mg/dL — AB
Specific Gravity, Urine: 1.009 (ref 1.005–1.030)
pH: 5 (ref 5.0–8.0)

## 2016-06-08 LAB — IRON AND TIBC
Iron: 22 ug/dL — ABNORMAL LOW (ref 45–182)
Saturation Ratios: 5 % — ABNORMAL LOW (ref 17.9–39.5)
TIBC: 428 ug/dL (ref 250–450)
UIBC: 406 ug/dL

## 2016-06-08 LAB — PATHOLOGIST SMEAR REVIEW

## 2016-06-08 LAB — RAPID HIV SCREEN (HIV 1/2 AB+AG)
HIV 1/2 Antibodies: NONREACTIVE
HIV-1 P24 Antigen - HIV24: NONREACTIVE

## 2016-06-08 LAB — LACTIC ACID, PLASMA
Lactic Acid, Venous: 14.5 mmol/L (ref 0.5–1.9)
Lactic Acid, Venous: 13.7 mmol/L (ref 0.5–1.9)

## 2016-06-08 LAB — MRSA PCR SCREENING: MRSA by PCR: NEGATIVE

## 2016-06-08 LAB — FOLATE: Folate: 20.8 ng/mL (ref >5.9)

## 2016-06-08 LAB — TSH: TSH: 3.181 u[IU]/mL (ref 0.350–4.500)

## 2016-06-08 LAB — GLUCOSE, CAPILLARY: Glucose-Capillary: 102 mg/dL — ABNORMAL HIGH (ref 65–99)

## 2016-06-08 LAB — PREPARE RBC (CROSSMATCH)

## 2016-06-08 LAB — APTT: aPTT: 40 seconds — ABNORMAL HIGH (ref 24–36)

## 2016-06-08 LAB — PROTIME-INR
INR: 1.68
Prothrombin Time: 20 seconds — ABNORMAL HIGH (ref 11.4–15.2)

## 2016-06-08 LAB — BRAIN NATRIURETIC PEPTIDE: B Natriuretic Peptide: 3078 pg/mL — ABNORMAL HIGH (ref 0.0–100.0)

## 2016-06-08 LAB — FERRITIN: Ferritin: 7 ng/mL — ABNORMAL LOW (ref 24–336)

## 2016-06-08 LAB — TROPONIN I: Troponin I: 13.32 ng/mL (ref <0.03)

## 2016-06-08 LAB — ABO/RH: ABO/RH(D): B POS

## 2016-06-08 LAB — ETHANOL: Alcohol, Ethyl (B): <5 mg/dL (ref <5)

## 2016-06-08 LAB — LIPASE, BLOOD: LIPASE: 31 U/L (ref 11–51)

## 2016-06-08 MED ORDER — IPRATROPIUM-ALBUTEROL 0.5-2.5 (3) MG/3ML IN SOLN: 3.0000 mL | RESPIRATORY_TRACT | Status: DC | PRN

## 2016-06-08 MED ORDER — SODIUM CHLORIDE 0.9 % IV SOLN
INTRAVENOUS | Status: DC
  Administered 2016-06-08: 15:00:00 via INTRAVENOUS

## 2016-06-08 MED ORDER — SODIUM CHLORIDE 0.9 % IV SOLN
10.0000 mL/h | Freq: Once | INTRAVENOUS | Status: AC
  Administered 2016-06-08: 10 mL/h via INTRAVENOUS

## 2016-06-08 MED ORDER — NITROGLYCERIN IN D5W 200-5 MCG/ML-% IV SOLN
5.0000 ug/min | INTRAVENOUS | Status: DC
  Administered 2016-06-08: 5 ug/min via INTRAVENOUS
  Filled 2016-06-08: qty 250

## 2016-06-08 MED ORDER — BUDESONIDE 0.5 MG/2ML IN SUSP
0.5000 mg | Freq: Two times a day (BID) | RESPIRATORY_TRACT | Status: DC
  Administered 2016-06-09 – 2016-06-11 (×6): 0.5 mg via RESPIRATORY_TRACT
  Filled 2016-06-08 (×6): qty 2

## 2016-06-08 MED ORDER — METHYLPREDNISOLONE SODIUM SUCC 40 MG IJ SOLR
40.0000 mg | Freq: Two times a day (BID) | INTRAMUSCULAR | Status: DC
  Administered 2016-06-08 – 2016-06-10 (×5): 40 mg via INTRAVENOUS
  Filled 2016-06-08 (×5): qty 1

## 2016-06-08 MED ORDER — DOCUSATE SODIUM 100 MG PO CAPS: 100.0000 mg | ORAL_CAPSULE | Freq: Two times a day (BID) | ORAL | Status: DC | PRN

## 2016-06-08 MED ORDER — IPRATROPIUM-ALBUTEROL 0.5-2.5 (3) MG/3ML IN SOLN
3.0000 mL | Freq: Four times a day (QID) | RESPIRATORY_TRACT | Status: DC | PRN
  Administered 2016-06-08: 3 mL via RESPIRATORY_TRACT
  Filled 2016-06-08: qty 3

## 2016-06-08 MED ORDER — FUROSEMIDE 10 MG/ML IJ SOLN
40.0000 mg | Freq: Once | INTRAMUSCULAR | Status: AC
  Administered 2016-06-08: 40 mg via INTRAVENOUS
  Filled 2016-06-08: qty 4

## 2016-06-08 MED ORDER — SODIUM CHLORIDE 0.9 % IV BOLUS (SEPSIS)
1000.0000 mL | Freq: Once | INTRAVENOUS | Status: AC
  Administered 2016-06-08: 1000 mL via INTRAVENOUS

## 2016-06-08 MED ORDER — PANTOPRAZOLE SODIUM 40 MG IV SOLR
40.0000 mg | Freq: Two times a day (BID) | INTRAVENOUS | Status: DC
  Administered 2016-06-08 – 2016-06-11 (×7): 40 mg via INTRAVENOUS
  Filled 2016-06-08 (×7): qty 40

## 2016-06-08 MED ORDER — SODIUM CHLORIDE 0.9 % IV SOLN
Freq: Once | INTRAVENOUS | Status: AC
  Administered 2016-06-08: 16:00:00 via INTRAVENOUS

## 2016-06-08 MED ORDER — SODIUM BICARBONATE 8.4 % IV SOLN
INTRAVENOUS | Status: DC
  Administered 2016-06-08 – 2016-06-10 (×4): via INTRAVENOUS
  Filled 2016-06-08 (×4): qty 150

## 2016-06-08 MED ORDER — METHYLPREDNISOLONE SODIUM SUCC 125 MG IJ SOLR
60.0000 mg | Freq: Once | INTRAMUSCULAR | Status: AC
  Administered 2016-06-08: 60 mg via INTRAVENOUS
  Filled 2016-06-08: qty 2

## 2016-06-08 NOTE — Consult Note (Signed)
[facility] Consultation      Assessment and Plan:  71 year old male who does not seek regular medical care, now presents with severe symptomatic anemia.  Severe symptomatic anemia with associated acute kidney injury, acute lactic acidosis, hypothermia. -Transfuse. -Monitor fluid status. -Continue IV Protonix, the patient will require gastroenterology consultation some point for  colonoscopy in order to further workup the patient's anemia. -Warming blanket, monitor temperature.  NSTEMI -Likely secondary to severe anemia. -Cardiology following, further workup once the patient is more stable with possible cardiac catheterization. -Currently on nitroglycerin drip.     Date: 06/08/2016  MRN# [id number] Tristan [name] 1945-09-02  Referring Physician: Dr. Anselm Jungling for anemia, dizziness, hypothermia.   Tristan [name] is a 71 y.o. old male seen in consultation for chief complaint of:    Chief Complaint  Patient presents with  . Shortness of Breath    HPI:   Patient is 71 year old male, who does not seek medical care. He presented with 3-4 days of dizziness. He tells me his only known medical history is gout. He gets occasional gout pain in his joints, which which she treats with cherries and marijuana. He is a former smoker but has not smoked in several years. He does not have any normal screening. Upon presentation, his hemoglobin was noted to be 3.7, down from 14.6 in November 2015. His MCV was very low at 61.6, with increased RDW consistent with severe iron deficiency anemia. He notes some occasional diarrhea alternating with constipation over the last few weeks. He does not take any NSAID medications for pain and does not take any other known over-the-counter medications. He notes no bloody stools, black stools, or other evidence of blood loss.  He tells me he is not in contact withhis family, including his siblings and his children. His only local family  contact is one of his brothers,  Tristan [name], whom he would like to make medical decisions if he is unable. His children are grown and he is not in touch with them.    PMHX:   Past Medical History:  Diagnosis Date  . Hypertension    Surgical Hx:  No past surgical history on file. Family Hx:  Family History  Problem Relation Age of Onset  . Hypertension Brother    Social Hx:   Social History  Substance Use Topics  . Smoking status: Former Smoker    Quit date: 06/09/1979  . Smokeless tobacco: Not on file  . Alcohol use No   Medication:    Current Facility-Administered Medications:  .  0.9 %  sodium chloride infusion, , Intravenous, Continuous, Vaughan Basta, MD .  nitroGLYCERIN 50 mg in dextrose 5 % 250 mL (0.2 mg/mL) infusion, 5 mcg/min, Intravenous, Titrated, Carrie Mew, MD, Last Rate: 9 mL/hr at 06/08/16 1315, 30 mcg/min at 06/08/16 1315 .  pantoprazole (PROTONIX) injection 40 mg, 40 mg, Intravenous, Q12H, Vaughan Basta, MD No current outpatient prescriptions on file.   Allergies:  Patient has no known allergies.  Review of Systems: Gen:  Denies  fever, sweats, chills HEENT: Denies blurred vision, double vision. bleeds, sore throat Cvc:  No dizziness, chest pain. Resp:   Denies cough or sputum production, shortness of breath Gi: Denies swallowing difficulty, stomach pain. Gu:  Denies bladder incontinence, burning urine Ext:   No Joint pain, stiffness. Skin: No skin rash,  hives  Endoc:  No polyuria, polydipsia. Psych: No depression, insomnia. Other:  All other systems were reviewed with the patient and were negative  other that what is mentioned in the HPI.   Physical Examination:   VS: BP 131/79   Pulse (!) 114   Temp (!) 96.2 F (35.7 C) (Rectal) Comment: Bair hugger remains on patient.  Resp (!) 22   Ht 5\' 11"  (1.803 m)   Wt 165 lb 2 oz (74.9 kg)   SpO2 100%   BMI 23.03 kg/m   General Appearance: No distress  Neuro:without  focal findings,  speech normal,  HEENT: PERRLA, EOM intact.   Pulmonary: normal breath sounds CardiovascularNormal S1,S2.  No m/r/g.   Abdomen: Benign, Soft, non-tender. Renal:  No costovertebral tenderness  GU:  No performed at this time. Endoc: No evident thyromegaly, no signs of acromegaly. Skin:   warm, no rashes, no ecchymosis  Extremities: normal, no cyanosis, clubbing.  Other findings:    LABORATORY PANEL:   CBC  Recent Labs Lab 06/08/16 1018  WBC 14.8*  HGB 3.7*  HCT 15.3*  PLT 425   ------------------------------------------------------------------------------------------------------------------  Chemistries   Recent Labs Lab 06/08/16 1018  NA 135  K 5.1  CL 104  CO2 7*  GLUCOSE 184*  BUN 43*  CREATININE 2.80*  CALCIUM 9.0  AST 654*  ALT 472*  ALKPHOS 67  BILITOT 2.5*   ------------------------------------------------------------------------------------------------------------------  Cardiac Enzymes  Recent Labs Lab 06/08/16 1018  TROPONINI 13.32*   ------------------------------------------------------------  RADIOLOGY:  Dg Chest Portable 1 View  Result Date: 06/08/2016 CLINICAL DATA:  Dyspnea EXAM: PORTABLE CHEST 1 VIEW COMPARISON:  None. FINDINGS: Cardiac shadow is within normal limits. The lungs are well aerated bilaterally. No focal infiltrate or sizable effusion is seen. No bony abnormality is noted. IMPRESSION: No acute abnormality seen. Electronically Signed   By: Inez Catalina M.D.   On: 06/08/2016 11:00   US Abdomen Limited Ruq  Result Date: 06/08/2016 CLINICAL DATA:  71 year old male with elevated LFTs. EXAM: US ABDOMEN LIMITED - RIGHT UPPER QUADRANT COMPARISON:  None. FINDINGS: Gallbladder: Gallbladder sludge identified. There is no evidence of cholelithiasis or acute cholecystitis. Common bile duct: Diameter: 3 mm. There is no evidence of intrahepatic or extrahepatic biliary dilatation. Liver: Slightly increased hepatic echogenicity  likely represents hepatic steatosis. No other hepatic abnormalities identified. IMPRESSION: No evidence of acute abnormality. Slightly increased hepatic echogenicity suggesting hepatic steatosis. Gallbladder sludge. No evidence of cholelithiasis or acute cholecystitis. Electronically Signed   By: Margarette Canada M.D.   On: 06/08/2016 13:45       Thank  you for the consultation and for allowing [facility] to assist in the care of your patient. Our recommendations are noted above.  Please contact us if we can be of further service.   Marda Stalker, MD.  Board Certified in Internal Medicine, Pulmonary Medicine, [place], and Sleep Medicine.  [facility] Office Number: [phone]  Patricia Pesa, M.D.  Merton Border, M.D  06/08/2016

## 2016-06-08 NOTE — Consult Note (Signed)
Tristan Lame, MD [facility]  [address], [place], [place] [address] Phone: [phone] Fax : [phone]  Consultation  Referring Provider:     Dr. Anselm Jungling Primary Care Physician:  Patient, No Pcp Per Primary Gastroenterologist:  Althia Forts         Reason for Consultation:     Anemia  Date of Admission:  06/08/2016 Date of Consultation:  06/08/2016         HPI:   Tristan [name] is a 71 y.o. male Who is admitted with shortness of breath and reports being weak for the last 3-4 days before being admitted to the hospital.  The patient denies any nausea vomiting black stools or bloody stools.  On admission to the Hospital patient was found to have a hemoglobin of 3.7 and was guaiac negative.  The patient was also noted to have an elevated troponin and acute renal failure with elevated liver enzymes.  The patient denies ever having any GI bleeding the past.  He also denies ever having an EGD or colonoscopy.  There is no report of any unexplained weight loss fevers chills nausea vomiting.  The patient also denies any change in bowel habits. The patient's urinalysis showed white cells and red cells that were too numerous to count. The patient's folate was normal But the ferritin was low at 7.  Past Medical History:  Diagnosis Date  . Hypertension     No past surgical history on file.  Prior to Admission medications   Not on File    Family History  Problem Relation Age of Onset  . Hypertension Brother      Social History  Substance Use Topics  . Smoking status: Former Smoker    Quit date: 06/09/1979  . Smokeless tobacco: Not on file  . Alcohol use No    Allergies as of 06/08/2016  . (No Known Allergies)    Review of Systems:    All systems reviewed and negative except where noted in HPI.   Physical Exam:  Vital signs in last 24 hours: Temp:  [96.2 F (35.7 C)-98.9 F (37.2 C)] 98.6 F (37 C) (05/24 2015) Pulse Rate:  [93-125] 104 (05/24 2015) Resp:  [17-38]  23 (05/24 2015) BP: (94-143)/(39-98) 105/73 (05/24 1900) SpO2:  [90 %-100 %] 97 % (05/24 2015) Weight:  [165 lb 2 oz (74.9 kg)-170 lb 3.1 oz (77.2 kg)] 170 lb 3.1 oz (77.2 kg) (05/24 1431)   General:   Pleasant, cooperative in NAD Head:  Normocephalic and atraumatic. Eyes:   No icterus.   Conjunctiva pink. PERRLA. Ears:  Normal auditory acuity. Neck:  Supple; no masses or thyroidomegaly Lungs: Respirations even and unlabored. Lungs clear to auscultation bilaterally.   No wheezes, crackles, or rhonchi.  Heart:  Regular rate and rhythm;  Without murmur, clicks, rubs or gallops Abdomen:  Soft, nondistended, nontender. Normal bowel sounds. No appreciable masses or hepatomegaly.  No rebound or guarding.  Rectal:  Not performed. Msk:  Symmetrical without gross deformities.   Extremities:  Without edema, cyanosis or clubbing. Neurologic:  Alert and oriented x3;  grossly normal neurologically. Skin:  Intact without significant lesions or rashes. Cervical Nodes:  No significant cervical adenopathy. Psych:  Alert and cooperative. Normal affect.  LAB RESULTS:  Recent Labs  06/08/16 1018 06/08/16 1501  WBC 14.8*  --   HGB 3.7* 5.8*  HCT 15.3* 21.0*  PLT 425  --    BMET  Recent Labs  06/08/16 1018  NA 135  K 5.1  CL 104  CO2 7*  GLUCOSE 184*  BUN 43*  CREATININE 2.80*  CALCIUM 9.0   LFT  Recent Labs  06/08/16 1018  PROT 7.8  ALBUMIN 3.8  AST 654*  ALT 472*  ALKPHOS 67  BILITOT 2.5*   PT/INR  Recent Labs  06/08/16 1600  LABPROT 20.0*  INR 1.68    STUDIES: Dg Chest Port 1 View  Result Date: 06/08/2016 CLINICAL DATA:  Weakness and shortness of breath for 3 days. EXAM: PORTABLE CHEST 1 VIEW COMPARISON:  Single-view of the chest 06/08/2016. FINDINGS: The lungs are clear. Heart size is normal. No pneumothorax or pleural effusion. No acute bony abnormality. IMPRESSION: No acute disease. Electronically Signed   By: Inge Rise M.D.   On: 06/08/2016 15:33   Dg  Chest Portable 1 View  Result Date: 06/08/2016 CLINICAL DATA:  Dyspnea EXAM: PORTABLE CHEST 1 VIEW COMPARISON:  None. FINDINGS: Cardiac shadow is within normal limits. The lungs are well aerated bilaterally. No focal infiltrate or sizable effusion is seen. No bony abnormality is noted. IMPRESSION: No acute abnormality seen. Electronically Signed   By: Inez Catalina M.D.   On: 06/08/2016 11:00   US Abdomen Limited Ruq  Result Date: 06/08/2016 CLINICAL DATA:  71 year old male with elevated LFTs. EXAM: US ABDOMEN LIMITED - RIGHT UPPER QUADRANT COMPARISON:  None. FINDINGS: Gallbladder: Gallbladder sludge identified. There is no evidence of cholelithiasis or acute cholecystitis. Common bile duct: Diameter: 3 mm. There is no evidence of intrahepatic or extrahepatic biliary dilatation. Liver: Slightly increased hepatic echogenicity likely represents hepatic steatosis. No other hepatic abnormalities identified. IMPRESSION: No evidence of acute abnormality. Slightly increased hepatic echogenicity suggesting hepatic steatosis. Gallbladder sludge. No evidence of cholelithiasis or acute cholecystitis. Electronically Signed   By: Margarette Canada M.D.   On: 06/08/2016 13:45      Impression / Plan:   Tristan [name] is a 71 y.o. y/o male with profound anemia that is likely chronic.  Despite his iron studies showing him to have a low ferritin he has been heme-negative from below and denies any sign of any GI bleeding.  This patient's anemia likely occurred over a prolonged period of time and only became symptomatic when he became profoundly anemic.  The patient should have an EGD and colonoscopy at some time in the future but with his elevated liver enzymes from shock liver, increased troponin and hypernatremia I would hold off on any procedures at this time In addition the patient had a BNP that was over 3000.  It has been explained the plan to just observe him and transfuse him as needed for now and plan for a EGD and  colonoscopy as an outpatient.  Thank you for involving me in the care of this patient.      LOS: 0 days   Tristan Lame, MD  06/08/2016, 9:02 PM   Note: This dictation was prepared with Dragon dictation along with smaller phrase technology. Any transcriptional errors that result from this process are unintentional.

## 2016-06-08 NOTE — H&P (Signed)
Tristan [name] NAME: Tristan [name]    MR#:  [id number]  DATE OF BIRTH:  Sep 19, 1945  DATE OF ADMISSION:  06/08/2016  PRIMARY CARE PHYSICIAN: Patient, No Pcp Per   REQUESTING/REFERRING PHYSICIAN: stafford  CHIEF COMPLAINT:   Chief Complaint  Patient presents with  . Shortness of Breath    HISTORY OF PRESENT ILLNESS: Tristan [name]  is a 71 y.o. male with a known history of Hypertension- came to emergency room for complaint of 3-4 days weakness and shortness of breath. Not giving very clear history but denies any vomiting or blood in the stool. Denies any chest pain. In ER noted to have severe anemia, as per ER physician and stool guaiac was negative, also noted to have acute renal failure and elevated troponin, elevated liver function enzymes, elevated lactic acid and have hypothermia. ER physician ordered 3 units of blood transfusion and called for admission to hospitalist team. Patient is also seen by cardiologist and due to acute renal failure and severe anemia he suggested and patient agreed not to have cardiac catheterization.  PAST MEDICAL HISTORY:   Past Medical History:  Diagnosis Date  . Hypertension     PAST SURGICAL HISTORY: No past surgical history on file.  SOCIAL HISTORY:  Social History  Substance Use Topics  . Smoking status: Former Smoker    Quit date: 06/09/1979  . Smokeless tobacco: Not on file  . Alcohol use No    FAMILY HISTORY:  Family History  Problem Relation Age of Onset  . Hypertension Brother     DRUG ALLERGIES: No Known Allergies  REVIEW OF SYSTEMS:   CONSTITUTIONAL: No fever, Positive for fatigue or weakness.  EYES: No blurred or double vision.  EARS, NOSE, AND THROAT: No tinnitus or ear pain.  RESPIRATORY: No cough, shortness of breath, wheezing or hemoptysis.  CARDIOVASCULAR: No chest pain, orthopnea, edema.  GASTROINTESTINAL: No nausea, vomiting, diarrhea or abdominal pain.  GENITOURINARY: No  dysuria, hematuria.  ENDOCRINE: No polyuria, nocturia,  HEMATOLOGY: No anemia, easy bruising or bleeding SKIN: No rash or lesion. MUSCULOSKELETAL: No joint pain or arthritis.   NEUROLOGIC: No tingling, numbness, weakness.  PSYCHIATRY: No anxiety or depression.   MEDICATIONS AT HOME:  Prior to Admission medications   Not on File      PHYSICAL EXAMINATION:   VITAL SIGNS: Blood pressure 137/88, pulse (!) 117, temperature 98.5 F (36.9 C), temperature source Oral, resp. rate (!) 23, height 5\' 11"  (1.803 m), weight 77.2 kg (170 lb 3.1 oz), SpO2 100 %.  GENERAL:  71 y.o.-year-old patient lying in the bed with no acute distress.  EYES: Pupils equal, round, reactive to light and accommodation. No scleral icterus. Extraocular muscles intact.  HEENT: Head atraumatic, normocephalic. Oropharynx and nasopharynx clear.  NECK:  Supple, no jugular venous distention. No thyroid enlargement, no tenderness.  LUNGS: Normal breath sounds bilaterally, no wheezing, rales,rhonchi or crepitation. No use of accessory muscles of respiration.  CARDIOVASCULAR: S1, S2 fast. No murmurs, rubs, or gallops.  ABDOMEN: Soft, nontender, nondistended. Bowel sounds present. No organomegaly or mass.  EXTREMITIES: No pedal edema, cyanosis, or clubbing.  NEUROLOGIC: Cranial nerves II through XII are intact. Muscle strength 3-4/5 in all extremities. Sensation intact. Gait not checked.  PSYCHIATRIC: The patient is alert and oriented x 3.  SKIN: No obvious rash, lesion, or ulcer.   LABORATORY PANEL:   CBC  Recent Labs Lab 06/08/16 1018  WBC 14.8*  HGB 3.7*  HCT 15.3*  PLT  425  MCV 61.6*  MCH 15.0*  MCHC 24.4*  RDW 23.7*  LYMPHSABS 0.9*  MONOABS 1.2*  EOSABS 0.0  BASOSABS 0.1   ------------------------------------------------------------------------------------------------------------------  Chemistries   Recent Labs Lab 06/08/16 1018  NA 135  K 5.1  CL 104  CO2 7*  GLUCOSE 184*  BUN 43*   CREATININE 2.80*  CALCIUM 9.0  AST 654*  ALT 472*  ALKPHOS 67  BILITOT 2.5*   ------------------------------------------------------------------------------------------------------------------ estimated creatinine clearance is 26.1 mL/min (A) (by C-G formula based on SCr of 2.8 mg/dL (H)). ------------------------------------------------------------------------------------------------------------------ No results for input(s): TSH, T4TOTAL, T3FREE, THYROIDAB in the last 72 hours.  Invalid input(s): FREET3   Coagulation profile No results for input(s): INR, PROTIME in the last 168 hours. ------------------------------------------------------------------------------------------------------------------- No results for input(s): DDIMER in the last 72 hours. -------------------------------------------------------------------------------------------------------------------  Cardiac Enzymes  Recent Labs Lab 06/08/16 1018  TROPONINI 13.32*   ------------------------------------------------------------------------------------------------------------------ Invalid input(s): POCBNP  ---------------------------------------------------------------------------------------------------------------  Urinalysis No results found for: COLORURINE, APPEARANCEUR, LABSPEC, PHURINE, GLUCOSEU, HGBUR, BILIRUBINUR, KETONESUR, PROTEINUR, UROBILINOGEN, NITRITE, LEUKOCYTESUR   RADIOLOGY: Dg Chest Portable 1 View  Result Date: 06/08/2016 CLINICAL DATA:  Dyspnea EXAM: PORTABLE CHEST 1 VIEW COMPARISON:  None. FINDINGS: Cardiac shadow is within normal limits. The lungs are well aerated bilaterally. No focal infiltrate or sizable effusion is seen. No bony abnormality is noted. IMPRESSION: No acute abnormality seen. Electronically Signed   By: Inez Catalina M.D.   On: 06/08/2016 11:00   US Abdomen Limited Ruq  Result Date: 06/08/2016 CLINICAL DATA:  70 year old male with elevated LFTs. EXAM: US ABDOMEN  LIMITED - RIGHT UPPER QUADRANT COMPARISON:  None. FINDINGS: Gallbladder: Gallbladder sludge identified. There is no evidence of cholelithiasis or acute cholecystitis. Common bile duct: Diameter: 3 mm. There is no evidence of intrahepatic or extrahepatic biliary dilatation. Liver: Slightly increased hepatic echogenicity likely represents hepatic steatosis. No other hepatic abnormalities identified. IMPRESSION: No evidence of acute abnormality. Slightly increased hepatic echogenicity suggesting hepatic steatosis. Gallbladder sludge. No evidence of cholelithiasis or acute cholecystitis. Electronically Signed   By: Margarette Canada M.D.   On: 06/08/2016 13:45    EKG: Orders placed or performed during the hospital encounter of 06/08/16  . ED EKG  . ED EKG  . EKG 12-Lead  . EKG 12-Lead    IMPRESSION AND PLAN:  * Symptomatic anemia   Stool guaiac is negative as per ER physician.   Microcytic anemia, likely iron deficiency.   There is also elevated LDH and bilirubin level along with elevation and hepatic enzymes and renal failure.   So there may be a component of autoimmune disorder, called nephro and hematology consult.   Blood transfusions are ordered by ER physician, will follow hemoglobin and target around 8-9 of hemoglobin.  * Acute renal failure   IV fluids, continue monitoring, avoid nephrotoxic agents   Need to get a urinalysis and nephrology consult.  * Elevated liver enzymes and bilirubin   Called gastroenterology consult, IV Protonix for now.   No active bleed as per ER physician checked guiac.  * Non-ST elevation MI   Cardiology saw the patient, suggested nitroglycerin IV drip, echocardiogram, IV fluid and monitor for now.   Due to renal failure and severe anemia no catheterization at this point.  * Hypothermia   Check TSH, he is on heating blanket.  * Elevated lactic acid   Possibly due to renal failure and severe anemia, continue monitoring with IV fluids.  All the records are  reviewed and case discussed with ED provider. Management plans  discussed with the patient, family and they are in agreement.  CODE STATUS: as below.    Code Status Orders        Start     Ordered   06/08/16 1433  Limited resuscitation (code)  Continuous    Question Answer Comment  In the event of cardiac or respiratory ARREST: Initiate Code Blue, Call Rapid Response Yes   In the event of cardiac or respiratory ARREST: Perform CPR Yes   In the event of cardiac or respiratory ARREST: Perform Intubation/Mechanical Ventilation No   In the event of cardiac or respiratory ARREST: Use NIPPV/BiPAp only if indicated Yes   In the event of cardiac or respiratory ARREST: Administer ACLS medications if indicated Yes   In the event of cardiac or respiratory ARREST: Perform Defibrillation or Cardioversion if indicated Yes      06/08/16 1432    Code Status History    Date Active Date Inactive Code Status Order ID Comments User Context   This patient has a current code status but no historical code status.     Case discussed with cardiologist, intensivist, nephrologist.  TOTAL TIME TAKING CARE OF THIS PATIENT: 60 critical care minutes.    Vaughan Basta M.D on 06/08/2016   Between 7am to 6pm - Pager - [phone]  After 6pm go to [email or web address] - password [email or web address] [facility]  Office  [phone]  CC: Primary care physician; Patient, No Pcp Per   Note: This dictation was prepared with Dragon dictation along with smaller phrase technology. Any transcriptional errors that result from this process are unintentional.

## 2016-06-08 NOTE — ED Notes (Addendum)
Patient receiving emergency blood.  Initial VS include:  RR 25, 100% 5L [place], HR 102, 101/39, 96.7 rectal.  Patient's blood administration time includes:  11:34.  Initiated at 125 ml/hr Second verification on blood admin with Stanton Kidney, RN

## 2016-06-08 NOTE — Progress Notes (Signed)
Tristan [name] is a 71 y.o. male  [id number]  Primary Cardiologist: Neoma Laming Reason for Consultation: Elevated troponins  HPI: 32 YOBM presented to [facility] with 4 days of weakness, dizziness and shortness of breath.   Review of Systems: No chest pain, plapitation or orhopnea/PND   Past Medical History:  Diagnosis Date  . Hypertension      (Not in a hospital admission)   . pantoprazole (PROTONIX) IV  40 mg Intravenous Q12H    Infusions: . sodium chloride    . nitroGLYCERIN 35 mcg/min (06/08/16 1402)    No Known Allergies  Social History   Social History  . Marital status: Single    Spouse name: N/A  . Number of children: N/A  . Years of education: N/A   Occupational History  . Not on file.   Social History Main Topics  . Smoking status: Former Smoker    Quit date: 06/09/1979  . Smokeless tobacco: Not on file  . Alcohol use No  . Drug use: Yes    Types: Marijuana  . Sexual activity: Not on file   Other Topics Concern  . Not on file   Social History Narrative  . No narrative on file    Family History  Problem Relation Age of Onset  . Hypertension Brother     PHYSICAL EXAM: Vitals:   06/08/16 1400 06/08/16 1406  BP: 137/88   Pulse: (!) 117   Resp:    Temp:  97.3 F (36.3 C)     Intake/Output Summary (Last 24 hours) at 06/08/16 1407 Last data filed at 06/08/16 1300  Gross per 24 hour  Intake             2642 ml  Output                0 ml  Net             2642 ml    General:  Well appearing. No respiratory difficulty HEENT: normal Neck: supple. no JVD. Carotids 2+ bilat; no bruits. No lymphadenopathy or thryomegaly appreciated. Cor: PMI nondisplaced. Regular rate & rhythm. No rubs, gallops or murmurs. Lungs: clear Abdomen: soft, nontender, nondistended. No hepatosplenomegaly. No bruits or masses. Good bowel sounds. Extremities: no cyanosis, clubbing, rash, edema Neuro: alert & oriented x 3, cranial nerves grossly intact.  moves all 4 extremities w/o difficulty. Affect pleasant.  ECG: Sinus tachycardia with non-specific st depression diffusedly  Results for orders placed or performed during the hospital encounter of 06/08/16 (from the past 24 hour(s))  Comprehensive metabolic panel     Status: Abnormal   Collection Time: 06/08/16 10:18 AM  Result Value Ref Range   Sodium 135 135 - 145 mmol/L   Potassium 5.1 3.5 - 5.1 mmol/L   Chloride 104 101 - 111 mmol/L   CO2 7 (L) 22 - 32 mmol/L   Glucose, Bld 184 (H) 65 - 99 mg/dL   BUN 43 (H) 6 - 20 mg/dL   Creatinine, Ser 2.80 (H) 0.61 - 1.24 mg/dL   Calcium 9.0 8.9 - 10.3 mg/dL   Total Protein 7.8 6.5 - 8.1 g/dL   Albumin 3.8 3.5 - 5.0 g/dL   AST 654 (H) 15 - 41 U/L   ALT 472 (H) 17 - 63 U/L   Alkaline Phosphatase 67 38 - 126 U/L   Total Bilirubin 2.5 (H) 0.3 - 1.2 mg/dL   GFR calc non Af Amer 21 (L) >60 mL/min   GFR calc Af  Amer 25 (L) >60 mL/min   Anion gap 24 (H) 5 - 15  Ethanol     Status: None   Collection Time: 06/08/16 10:18 AM  Result Value Ref Range   Alcohol, Ethyl (B) <5 <5 mg/dL  Troponin I     Status: Abnormal   Collection Time: 06/08/16 10:18 AM  Result Value Ref Range   Troponin I 13.32 (HH) <0.03 ng/mL  Lipase, blood     Status: None   Collection Time: 06/08/16 10:18 AM  Result Value Ref Range   Lipase 31 11 - 51 U/L  Lactic acid, plasma     Status: Abnormal   Collection Time: 06/08/16 10:18 AM  Result Value Ref Range   Lactic Acid, Venous 14.5 (HH) 0.5 - 1.9 mmol/L  CBC with Differential     Status: Abnormal   Collection Time: 06/08/16 10:18 AM  Result Value Ref Range   WBC 14.8 (H) 3.8 - 10.6 K/uL   RBC 2.49 (L) 4.40 - 5.90 MIL/uL   Hemoglobin 3.7 (LL) 13.0 - 18.0 g/dL   HCT 15.3 (L) 40.0 - 52.0 %   MCV 61.6 (L) 80.0 - 100.0 fL   MCH 15.0 (L) 26.0 - 34.0 pg   MCHC 24.4 (L) 32.0 - 36.0 g/dL   RDW 23.7 (H) 11.5 - 14.5 %   Platelets 425 150 - 440 K/uL   Neutrophils Relative % 85 %   Lymphocytes Relative 6 %   Monocytes  Relative 8 %   Eosinophils Relative 0 %   Basophils Relative 1 %   nRBC 8 (H) 0 /100 WBC   Neutro Abs 12.6 (H) 1.4 - 6.5 K/uL   Lymphs Abs 0.9 (L) 1.0 - 3.6 K/uL   Monocytes Absolute 1.2 (H) 0.2 - 1.0 K/uL   Eosinophils Absolute 0.0 0 - 0.7 K/uL   Basophils Absolute 0.1 0 - 0.1 K/uL   RBC Morphology MIXED RBC POPULATION    Smear Review      PATH REVIEW HAS BEEN ORDERED ON THIS ACCESSION NUMBER  ABO/Rh     Status: None   Collection Time: 06/08/16 10:18 AM  Result Value Ref Range   ABO/RH(D) B POS   Prepare RBC (crossmatch)     Status: None   Collection Time: 06/08/16 11:13 AM  Result Value Ref Range   Order Confirmation ORDER PROCESSED BY BLOOD BANK   Type and screen [facility]     Status: None (Preliminary result)   Collection Time: 06/08/16 11:14 AM  Result Value Ref Range   ABO/RH(D) B POS    Antibody Screen NEG    Sample Expiration 06/11/2016    Unit Number [id number]    Blood Component Type RED CELLS,LR    Unit division 00    Status of Unit ISSUED    Transfusion Status OK TO TRANSFUSE    Crossmatch Result COMPATIBLE    Unit tag comment EMERGENCY RELEASE    Unit Number [id number]    Blood Component Type RBC LR PHER1    Unit division 00    Status of Unit ISSUED    Transfusion Status OK TO TRANSFUSE    Crossmatch Result COMPATIBLE    Unit tag comment EMERGENCY RELEASE    Unit Number [id number]    Blood Component Type RED CELLS,LR    Unit division 00    Status of Unit ALLOCATED    Transfusion Status OK TO TRANSFUSE    Crossmatch Result Compatible    Unit Number [id number]  Blood Component Type RED CELLS,LR    Unit division 00    Status of Unit ALLOCATED    Transfusion Status OK TO TRANSFUSE    Crossmatch Result Compatible   Prepare RBC     Status: None   Collection Time: 06/08/16 11:55 AM  Result Value Ref Range   Order Confirmation ORDER PROCESSED BY BLOOD BANK   Iron and TIBC     Status: Abnormal   Collection Time:  06/08/16  1:09 PM  Result Value Ref Range   Iron 22 (L) 45 - 182 ug/dL   TIBC 428 250 - 450 ug/dL   Saturation Ratios 5 (L) 17.9 - 39.5 %   UIBC 406 ug/dL  Lactate dehydrogenase     Status: Abnormal   Collection Time: 06/08/16  1:09 PM  Result Value Ref Range   LDH 1,357 (H) 98 - 192 U/L   Dg Chest Portable 1 View  Result Date: 06/08/2016 CLINICAL DATA:  Dyspnea EXAM: PORTABLE CHEST 1 VIEW COMPARISON:  None. FINDINGS: Cardiac shadow is within normal limits. The lungs are well aerated bilaterally. No focal infiltrate or sizable effusion is seen. No bony abnormality is noted. IMPRESSION: No acute abnormality seen. Electronically Signed   By: Inez Catalina M.D.   On: 06/08/2016 11:00   US Abdomen Limited Ruq  Result Date: 06/08/2016 CLINICAL DATA:  72 year old male with elevated LFTs. EXAM: US ABDOMEN LIMITED - RIGHT UPPER QUADRANT COMPARISON:  None. FINDINGS: Gallbladder: Gallbladder sludge identified. There is no evidence of cholelithiasis or acute cholecystitis. Common bile duct: Diameter: 3 mm. There is no evidence of intrahepatic or extrahepatic biliary dilatation. Liver: Slightly increased hepatic echogenicity likely represents hepatic steatosis. No other hepatic abnormalities identified. IMPRESSION: No evidence of acute abnormality. Slightly increased hepatic echogenicity suggesting hepatic steatosis. Gallbladder sludge. No evidence of cholelithiasis or acute cholecystitis. Electronically Signed   By: Margarette Canada M.D.   On: 06/08/2016 13:45     ASSESSMENT AND PLAN: NSTEMI/ with Elevated troponins associated with elevated LFTS,renal failure, severe anemia(hemoglobin3), with decreased O2 carrying capacity leading to multi-organ failure. Also has elevated lactic acidosis, and mat be septic. Discussed with patient and patient agreed not have cardiac cath at any point in hospitalization, thus should be DNR. Advise no heparin, but may give IV nito to titrate for BPS 100. Also bring Hemoglobin to  9-10 as may have demand ischemia. Kolsen Choe A

## 2016-06-08 NOTE — ED Notes (Signed)
Post 15 minute blood transfusion VS:  97% 5l [place], 93 HR, 17 RR, 106/69, 96.4 rectally.   Transfusing change of rate at 11:52 to 999 ml/hr

## 2016-06-08 NOTE — Progress Notes (Signed)
Patient has not voided all day, bladder scan revealed 300 mL. Per Hinton Dyer, NP insert for retention and to monitor UOP due to renal failure. Foley placed. Wilnette Kales

## 2016-06-08 NOTE — ED Notes (Signed)
Admitting MD at bedside.

## 2016-06-08 NOTE — Progress Notes (Signed)
Chaplain received an order to visit with pt in room IC17. Chaplain provided information for an Advanced Directive.    06/08/16 1825  Clinical Encounter Type  Visited With Patient  Visit Type Initial  Referral From Nurse  Consult/Referral To Chaplain  Spiritual Encounters  Spiritual Needs Other (Comment)

## 2016-06-08 NOTE — Consult Note (Signed)
[facility] ([facility])  Date of admission:  06/08/2016  Inpatient day:  06/08/2016  Consulting physician:  Dr. Vaughan Basta   Reason for Consultation:  Anemia, guaiac negative  Chief Complaint: Tristan [name] is a 71 y.o. male with no significant past medical history who was admitted through the emergency room with symptomatic anemia, acute renal insufficiency, NSTEMI, and elevated liver function tests.   HPI:  The patient states that he has not been to a doctor in several years. He denies any chronic medical problems. Prior to recent events, last available labs 11/20/2013 revealed a hematocrit of 44.6, hemoglobin 14.6 and MCV 98.  Creatinine was 1.18.  He has never had an EGD or colonoscopy.  He states that 3-4 days prior to admission, his appetite decreased. He describes eating a hot dog and a hamburger which cause some nausea, vomiting, and diarrhea. He felt better briefly then ate some chicken nuggets.  He again experienced some nausea, vomiting, and diarrhea.  He denied any melena, hematochezia, hematuria or hematemesis.  He denies any significant weight loss with this acute illness.  He comments that over the past 2 years, he has gained about 30-40 pounds.  He feels that his diet has been good. He eats little red meat, but mostly chicken. 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  He notes fatigue.  He presented to the emergency room via EMS. He was fatigued and short of breath.  CBC revealed a hematocrit of 15.3, hemoglobin 3.7, MCV 61.6, platelets 425,000, white count 14,800 with an ANC of 12,600. Differential was unremarkable. Lactic acid was 14.5.  Troponins were elevated at 13.32.  Creatinine was 2.8 with a BUN of 43. Calcium was normal at 9.0. Liver function tests included an AST of 654, ALT 472 and bilirubin 2.5.  Urinalysis revealed large hemoglobin, large leukocytes, few bacteria and too numerous to count RBCs and WBCs per high-power field.  B12 was 1122 and  folate 20.8.  Ferritin was 7.  Iron studies revealed a 5% iron saturation and 428 TIBC.  TSH was normal.  HIV negative.  LDH 1357.  Peripheral smear revealed microcytic anemia and neutrophilia.  Pending are: SPEP, free light chains, and acute hepatitis panel.  Symptomatically, he denies any current chest pain.    Past Medical History:  Diagnosis Date  . Hypertension     No past surgical history on file.  Family History  Problem Relation Age of Onset  . Hypertension Brother     Social History:  reports that he quit smoking about 37 years ago. He does not have any smokeless tobacco history on file. He reports that he uses drugs, including Marijuana. He reports that he does not drink alcohol.  He denies any exposure to radiation or toxins.  He lives in [place].  He has 2 children who he does not see.  He "worked a band saw for 17 years".  He is alone today.  Allergies: No Known Allergies  No prescriptions prior to admission.    Review of Systems: GENERAL:  Feels "ok".  No fevers or sweats.  Weight gain of 30-40 pounds in the past year. PERFORMANCE STATUS (ECOG):  1 HEENT:  No visual changes, runny nose, sore throat, mouth sores or tenderness. Lungs: Shortness of breath with exertion.  No cough.  No hemoptysis. Cardiac:  No chest pain, palpitations, orthopnea, or PND. GI:  No nausea, vomiting, diarrhea, constipation, melena or hematochezia. GU:  Mild dysuria.  No urgency, frequency, or hematuria.  Musculoskeletal:  No back pain.  No joint pain.  No muscle tenderness. Extremities:  Gout x 20-30 years.  No pain or swelling. Skin:  No rashes or skin changes. Neuro:  No headache, numbness or weakness, balance or coordination issues. Endocrine:  No diabetes, thyroid issues, hot flashes or night sweats. Psych:  No mood changes, depression or anxiety. Pain:  No focal pain. Review of systems:  All other systems reviewed and found to be negative.  Physical Exam:  Blood pressure  105/73, pulse (!) 104, temperature 98.6 F (37 C), temperature source Oral, resp. rate (!) 23, height 5' 11"  (1.803 m), weight 170 lb 3.1 oz (77.2 kg), SpO2 97 %.  GENERAL:  Thin gentleman sitting comfortably in the ICU in no acute distress. MENTAL STATUS:  Alert and oriented to person, place and time. HEAD:  Pearline Cables hair.  Slight gray goatee.  Normocephalic, atraumatic, face symmetric, no Cushingoid features. EYES:  Brown eyes.  Pupils equal round and reactive to light and accomodation.  No conjunctivitis or scleral icterus. ENT:  Oropharynx clear without lesion.  Tongue normal.  Poor dentition.  Mucous membranes moist.  RESPIRATORY:  Clear to auscultation without rales, wheezes or rhonchi. CARDIOVASCULAR:  Regular rate and rhythm without murmur, rub or gallop. ABDOMEN:  Soft, non-tender, with active bowel sounds, and no hepatosplenomegaly.  No masses. SKIN:  No rashes, ulcers or lesions. EXTREMITIES: ICDs in place; left ankle edema..  Right MCP bracelet.  No skin discoloration or tenderness.  No palpable cords. LYMPH NODES: No palpable cervical, supraclavicular, axillary or inguinal adenopathy  NEUROLOGICAL: Unremarkable. PSYCH:  Appropriate.   Results for orders placed or performed during the hospital encounter of 06/08/16 (from the past 48 hour(s))  Comprehensive metabolic panel     Status: Abnormal   Collection Time: 06/08/16 10:18 AM  Result Value Ref Range   Sodium 135 135 - 145 mmol/L    Comment: RESULTS VERIFIED BY REPEAT TESTING/HKP   Potassium 5.1 3.5 - 5.1 mmol/L   Chloride 104 101 - 111 mmol/L   CO2 7 (L) 22 - 32 mmol/L   Glucose, Bld 184 (H) 65 - 99 mg/dL   BUN 43 (H) 6 - 20 mg/dL   Creatinine, Ser 2.80 (H) 0.61 - 1.24 mg/dL   Calcium 9.0 8.9 - 10.3 mg/dL   Total Protein 7.8 6.5 - 8.1 g/dL   Albumin 3.8 3.5 - 5.0 g/dL   AST 654 (H) 15 - 41 U/L   ALT 472 (H) 17 - 63 U/L   Alkaline Phosphatase 67 38 - 126 U/L   Total Bilirubin 2.5 (H) 0.3 - 1.2 mg/dL   GFR calc non Af  Amer 21 (L) >60 mL/min   GFR calc Af Amer 25 (L) >60 mL/min    Comment: (NOTE) The eGFR has been calculated using the CKD EPI equation. This calculation has not been validated in all clinical situations. eGFR's persistently <60 mL/min signify possible Chronic Kidney Disease.    Anion gap 24 (H) 5 - 15  Ethanol     Status: None   Collection Time: 06/08/16 10:18 AM  Result Value Ref Range   Alcohol, Ethyl (B) <5 <5 mg/dL    Comment:        LOWEST DETECTABLE LIMIT FOR SERUM ALCOHOL IS 5 mg/dL FOR MEDICAL PURPOSES ONLY   Troponin I     Status: Abnormal   Collection Time: 06/08/16 10:18 AM  Result Value Ref Range   Troponin I 13.32 (HH) <0.03 ng/mL    Comment:  CRITICAL RESULT CALLED TO, READ BACK BY AND VERIFIED WITH EMMA HUNTER@1127  ON 06/08/16 BY HKP   Lipase, blood     Status: None   Collection Time: 06/08/16 10:18 AM  Result Value Ref Range   Lipase 31 11 - 51 U/L  Lactic acid, plasma     Status: Abnormal   Collection Time: 06/08/16 10:18 AM  Result Value Ref Range   Lactic Acid, Venous 14.5 (HH) 0.5 - 1.9 mmol/L    Comment: CRITICAL RESULT CALLED TO, READ BACK BY AND VERIFIED WITH EMMA HUNTER@1336  ON 06/08/16 BY HKP RESULT CONFIRMED BY MANUAL DILUTION   CBC with Differential     Status: Abnormal   Collection Time: 06/08/16 10:18 AM  Result Value Ref Range   WBC 14.8 (H) 3.8 - 10.6 K/uL   RBC 2.49 (L) 4.40 - 5.90 MIL/uL   Hemoglobin 3.7 (LL) 13.0 - 18.0 g/dL    Comment: CRITICAL RESULT CALLED TO, READ BACK BY AND VERIFIED WITH: MARY NEEDHAM AT 1048 ON 06/08/16 ALV    HCT 15.3 (L) 40.0 - 52.0 %   MCV 61.6 (L) 80.0 - 100.0 fL   MCH 15.0 (L) 26.0 - 34.0 pg   MCHC 24.4 (L) 32.0 - 36.0 g/dL   RDW 23.7 (H) 11.5 - 14.5 %   Platelets 425 150 - 440 K/uL   Neutrophils Relative % 85 %   Lymphocytes Relative 6 %   Monocytes Relative 8 %   Eosinophils Relative 0 %   Basophils Relative 1 %   nRBC 8 (H) 0 /100 WBC   Neutro Abs 12.6 (H) 1.4 - 6.5 K/uL   Lymphs Abs 0.9 (L) 1.0  - 3.6 K/uL   Monocytes Absolute 1.2 (H) 0.2 - 1.0 K/uL   Eosinophils Absolute 0.0 0 - 0.7 K/uL   Basophils Absolute 0.1 0 - 0.1 K/uL   RBC Morphology MIXED RBC POPULATION    Smear Review      PATH REVIEW HAS BEEN ORDERED ON THIS ACCESSION NUMBER  Pathologist smear review     Status: None   Collection Time: 06/08/16 10:18 AM  Result Value Ref Range   Path Review      Peripheral smear review shows microcytic anemia and neutrophilia.  Iron deficiency and infection are considerations. Correlation with clinical findings is required. Dr. Luana Shu.  ABO/Rh     Status: None   Collection Time: 06/08/16 10:18 AM  Result Value Ref Range   ABO/RH(D) B POS   Prepare RBC (crossmatch)     Status: None   Collection Time: 06/08/16 11:13 AM  Result Value Ref Range   Order Confirmation ORDER PROCESSED BY BLOOD BANK   Type and screen [facility]     Status: None (Preliminary result)   Collection Time: 06/08/16 11:14 AM  Result Value Ref Range   ABO/RH(D) B POS    Antibody Screen NEG    Sample Expiration 06/11/2016    Unit Number [id number]    Blood Component Type RED CELLS,LR    Unit division 00    Status of Unit ISSUED    Transfusion Status OK TO TRANSFUSE    Crossmatch Result COMPATIBLE    Unit tag comment EMERGENCY RELEASE    Unit Number [id number]    Blood Component Type RBC LR PHER1    Unit division 00    Status of Unit ISSUED    Transfusion Status OK TO TRANSFUSE    Crossmatch Result COMPATIBLE    Unit tag comment EMERGENCY RELEASE  Unit Number [id number]    Blood Component Type RED CELLS,LR    Unit division 00    Status of Unit ISSUED    Transfusion Status OK TO TRANSFUSE    Crossmatch Result Compatible    Unit Number [id number]    Blood Component Type RED CELLS,LR    Unit division 00    Status of Unit ISSUED    Transfusion Status OK TO TRANSFUSE    Crossmatch Result Compatible    Unit Number [id number]    Blood Component Type RED CELLS,LR     Unit division 00    Status of Unit ALLOCATED    Transfusion Status OK TO TRANSFUSE    Crossmatch Result Compatible    Unit Number [id number]    Blood Component Type RED CELLS,LR    Unit division 00    Status of Unit ALLOCATED    Transfusion Status OK TO TRANSFUSE    Crossmatch Result Compatible   Prepare RBC     Status: None   Collection Time: 06/08/16 11:55 AM  Result Value Ref Range   Order Confirmation ORDER PROCESSED BY BLOOD BANK   Lactic acid, plasma     Status: Abnormal   Collection Time: 06/08/16  1:09 PM  Result Value Ref Range   Lactic Acid, Venous 13.7 (HH) 0.5 - 1.9 mmol/L    Comment: CRITICAL RESULT CALLED TO, READ BACK BY AND VERIFIED WITH BRANDY MANSFIELD RN AT 1450 06/08/16.MSS RESULT CONFIRMED BY MANUAL DILUTION.MSS   Iron and TIBC     Status: Abnormal   Collection Time: 06/08/16  1:09 PM  Result Value Ref Range   Iron 22 (L) 45 - 182 ug/dL   TIBC 428 250 - 450 ug/dL   Saturation Ratios 5 (L) 17.9 - 39.5 %   UIBC 406 ug/dL  Lactate dehydrogenase     Status: Abnormal   Collection Time: 06/08/16  1:09 PM  Result Value Ref Range   LDH 1,357 (H) 98 - 192 U/L  Glucose, capillary     Status: Abnormal   Collection Time: 06/08/16  2:29 PM  Result Value Ref Range   Glucose-Capillary 102 (H) 65 - 99 mg/dL  MRSA PCR Screening     Status: None   Collection Time: 06/08/16  2:33 PM  Result Value Ref Range   MRSA by PCR NEGATIVE NEGATIVE    Comment:        The GeneXpert MRSA Assay (FDA approved for NASAL specimens only), is one component of a comprehensive MRSA colonization surveillance program. It is not intended to diagnose MRSA infection nor to guide or monitor treatment for MRSA infections.   Hemoglobin and hematocrit, blood     Status: Abnormal   Collection Time: 06/08/16  3:01 PM  Result Value Ref Range   Hemoglobin 5.8 (L) 13.0 - 18.0 g/dL    Comment: RESULT REPEATED AND VERIFIED   HCT 21.0 (L) 40.0 - 52.0 %  Rapid HIV screen (HIV 1/2 Ab+Ag)      Status: None   Collection Time: 06/08/16  3:01 PM  Result Value Ref Range   HIV-1 P24 Antigen - HIV24 NON REACTIVE NON REACTIVE   HIV 1/2 Antibodies NON REACTIVE NON REACTIVE   Interpretation (HIV Ag Ab)      A non reactive test result means that HIV 1 or HIV 2 antibodies and HIV 1 p24 antigen were not detected in the specimen.  Brain natriuretic peptide     Status: Abnormal   Collection Time: 06/08/16  3:05 PM  Result Value Ref Range   B Natriuretic Peptide 3,078.0 (H) 0.0 - 100.0 pg/mL  TSH     Status: None   Collection Time: 06/08/16  3:05 PM  Result Value Ref Range   TSH 3.181 0.350 - 4.500 uIU/mL    Comment: Performed by a 3rd Generation assay with a functional sensitivity of <=0.01 uIU/mL.  Protime-INR     Status: Abnormal   Collection Time: 06/08/16  4:00 PM  Result Value Ref Range   Prothrombin Time 20.0 (H) 11.4 - 15.2 seconds   INR 1.68   APTT     Status: Abnormal   Collection Time: 06/08/16  4:00 PM  Result Value Ref Range   aPTT 40 (H) 24 - 36 seconds    Comment:        IF BASELINE aPTT IS ELEVATED, SUGGEST PATIENT RISK ASSESSMENT BE USED TO DETERMINE APPROPRIATE ANTICOAGULANT THERAPY.   Ferritin     Status: Abnormal   Collection Time: 06/08/16  4:00 PM  Result Value Ref Range   Ferritin 7 (L) 24 - 336 ng/mL  Uric acid     Status: Abnormal   Collection Time: 06/08/16  4:00 PM  Result Value Ref Range   Uric Acid, Serum 15.4 (H) 4.4 - 7.6 mg/dL  Folate     Status: None   Collection Time: 06/08/16  4:00 PM  Result Value Ref Range   Folate 20.8 >5.9 ng/mL  Vitamin B12     Status: Abnormal   Collection Time: 06/08/16  4:00 PM  Result Value Ref Range   Vitamin B-12 1,122 (H) 180 - 914 pg/mL    Comment: (NOTE) This assay is not validated for testing neonatal or myeloproliferative syndrome specimens for Vitamin B12 levels. Performed at [facility], [place] [address], [place], [place] [address]   Prepare RBC     Status: None   Collection Time: 06/08/16   4:00 PM  Result Value Ref Range   Order Confirmation ORDER PROCESSED BY BLOOD BANK   Urinalysis, Complete w Microscopic     Status: Abnormal   Collection Time: 06/08/16  6:40 PM  Result Value Ref Range   Color, Urine YELLOW (A) YELLOW   APPearance CLOUDY (A) CLEAR   Specific Gravity, Urine 1.009 1.005 - 1.030   pH 5.0 5.0 - 8.0   Glucose, UA NEGATIVE NEGATIVE mg/dL   Hgb urine dipstick LARGE (A) NEGATIVE   Bilirubin Urine NEGATIVE NEGATIVE   Ketones, ur NEGATIVE NEGATIVE mg/dL   Protein, ur 30 (A) NEGATIVE mg/dL   Nitrite NEGATIVE NEGATIVE   Leukocytes, UA LARGE (A) NEGATIVE   RBC / HPF TOO NUMEROUS TO COUNT 0 - 5 RBC/hpf   WBC, UA TOO NUMEROUS TO COUNT 0 - 5 WBC/hpf   Bacteria, UA FEW (A) NONE SEEN   Squamous Epithelial / LPF 0-5 (A) NONE SEEN   WBC Clumps PRESENT    Mucous PRESENT    Hyaline Casts, UA PRESENT   Hemoglobin and hematocrit, blood     Status: Abnormal   Collection Time: 06/08/16  8:58 PM  Result Value Ref Range   Hemoglobin 7.6 (L) 13.0 - 18.0 g/dL   HCT 24.9 (L) 40.0 - 52.0 %   Dg Chest Port 1 View  Result Date: 06/08/2016 CLINICAL DATA:  Weakness and shortness of breath for 3 days. EXAM: PORTABLE CHEST 1 VIEW COMPARISON:  Single-view of the chest 06/08/2016. FINDINGS: The lungs are clear. Heart size is normal. No pneumothorax or pleural effusion. No acute bony  abnormality. IMPRESSION: No acute disease. Electronically Signed   By: Inge Rise M.D.   On: 06/08/2016 15:33   Dg Chest Portable 1 View  Result Date: 06/08/2016 CLINICAL DATA:  Dyspnea EXAM: PORTABLE CHEST 1 VIEW COMPARISON:  None. FINDINGS: Cardiac shadow is within normal limits. The lungs are well aerated bilaterally. No focal infiltrate or sizable effusion is seen. No bony abnormality is noted. IMPRESSION: No acute abnormality seen. Electronically Signed   By: Inez Catalina M.D.   On: 06/08/2016 11:00   US Abdomen Limited Ruq  Result Date: 06/08/2016 CLINICAL DATA:  71 year old male with  elevated LFTs. EXAM: US ABDOMEN LIMITED - RIGHT UPPER QUADRANT COMPARISON:  None. FINDINGS: Gallbladder: Gallbladder sludge identified. There is no evidence of cholelithiasis or acute cholecystitis. Common bile duct: Diameter: 3 mm. There is no evidence of intrahepatic or extrahepatic biliary dilatation. Liver: Slightly increased hepatic echogenicity likely represents hepatic steatosis. No other hepatic abnormalities identified. IMPRESSION: No evidence of acute abnormality. Slightly increased hepatic echogenicity suggesting hepatic steatosis. Gallbladder sludge. No evidence of cholelithiasis or acute cholecystitis. Electronically Signed   By: Margarette Canada M.D.   On: 06/08/2016 13:45    Assessment:  The patient is a 71 y.o. gentleman admitted with symptomatic anemia (hemoglon 3.7).  He developed a microcytic anemia in the past 2 years.  Diet appears good.  He has ice pica.  He has never had a colonoscopy or EGD.   Peripheral smear revealed microcytic anemia and neutrophilia.  There were no schistocytes.    Work-up on admission confirmed iron deficiency anemia (ferritin 7; iron saturation 7%).  Anemia has been slow to develop.  Normal labs included:  B12, folate, TSH, and HIV testing.  No evidence of hemolysis.  Suspect elevated creatinine secondary pre-renal azotemia.  Elevated LFTs secondary to shock liver or hepatitis.  Elevated troponins secondary to ischemia induced by severe anemia.  Urinalysis suggests potential source of infection or source of blood loss.  Lactic acid suggests infection.  Pending are: SPEP, free light chains, and acute hepatitis panel.   Plan:   1. Hematology:  Slowly developing microcytic anemia over 2 years.  Etiology possibly secondary to GI source.  Agree with outpatient EGD and colonoscopy.  If no GI source, consider GU source secondary to TNTC RBCs.  No current evidence of hemolysis.  Check Coombs and retic count.  Transfuse PRBCs to achieve a hemoglobin goal of 9-10.   Anticipate initiation of oral verus IV iron.  Follow-up pending labs: SPEP, free light chains, and acute hepatitis panel.  Thank you for allowing me to participate in [place] 's care.  I will follow him closely with you while hospitalized and after discharge in the outpatient department.   Lequita Asal, MD  06/08/2016, 9:37 PM

## 2016-06-08 NOTE — ED Notes (Signed)
Dr. Khan at bedside

## 2016-06-08 NOTE — Progress Notes (Signed)
After second 2 units complete. Hgb and Hct rechecked after one hour. Hgb 7.6, Hct 24.9. Verbal order from NP to report results of next 6 hr check. Will decide to transfuse or not off next results. Blood bank informed. 2 units ready in blood bank for patient if needed. Will continue to monitor.

## 2016-06-08 NOTE — Consult Note (Signed)
[facility]  CONSULT NOTE    Date: 06/08/2016                  Patient Name:  Tristan [name]  MRN: [id number]  DOB: 06-20-1945  Age / Sex: 71 y.o., male         PCP: Patient, No Pcp Per                 Service Requesting Consult: Dr. Ashby Dawes                 Reason for Consult: Acute Renal Failure            History of Present Illness: Tristan [name] is a 71 y.o. black male with hypertension, peripheral vascular disease, , who was admitted to [facility] on 06/08/2016 for Hypovolemic shock ([place]) [R57.1] Elevated liver enzymes [R74.8] Shock liver [K72.00] Non-ST elevation (NSTEMI) myocardial infarction [facility]) [I21.4] Anemia, unspecified type [D64.9]   Patient admitted with creatinine of 2.8 from baseline of 1.18 from 11/2013. Patient with elevated cardiac enzymes with troponin of 13.32.   Hemoglobin of 3.7 microcytic. Elevated LDH. Normal hemoglobin in 2015.   Patient's main complaint is right toe pain. He thinks this is gout. Uric acid elevated at 8.5.    Medications: Outpatient medications: No prescriptions prior to admission.    Current medications: Current Facility-Administered Medications  Medication Dose Route Frequency Provider Last Rate Last Dose  . 0.9 %  sodium chloride infusion   Intravenous Continuous Laverle Hobby, MD 10 mL/hr at 06/08/16 1447    . docusate sodium (COLACE) capsule 100 mg  100 mg Oral BID PRN Vaughan Basta, MD      . ipratropium-albuterol (DUONEB) 0.5-2.5 (3) MG/3ML nebulizer solution 3 mL  3 mL Nebulization Q6H PRN Laverle Hobby, MD   3 mL at 06/08/16 1452  . methylPREDNISolone sodium succinate (SOLU-MEDROL) 40 mg/mL injection 40 mg  40 mg Intravenous Q12H Awilda Bill, NP   40 mg at 06/08/16 1514  . nitroGLYCERIN 50 mg in dextrose 5 % 250 mL (0.2 mg/mL) infusion  5 mcg/min Intravenous Titrated Carrie Mew, MD 10.5 mL/hr at 06/08/16 1402 35 mcg/min at 06/08/16 1402  .  pantoprazole (PROTONIX) injection 40 mg  40 mg Intravenous Q12H Vaughan Basta, MD   40 mg at 06/08/16 1448      Allergies: No Known Allergies    Past Medical History: Past Medical History:  Diagnosis Date  . Hypertension      Past Surgical History: No past surgical history on file.   Family History: Family History  Problem Relation Age of Onset  . Hypertension Brother      Social History: Social History   Social History  . Marital status: Single    Spouse name: N/A  . Number of children: N/A  . Years of education: N/A   Occupational History  . Not on file.   Social History Main Topics  . Smoking status: Former Smoker    Quit date: 06/09/1979  . Smokeless tobacco: Not on file  . Alcohol use No  . Drug use: Yes    Types: Marijuana  . Sexual activity: Not on file   Other Topics Concern  . Not on file   Social History Narrative  . No narrative on file     Review of Systems: Review of Systems  Constitutional: Positive for diaphoresis, malaise/fatigue and weight loss. Negative for chills and fever.  HENT: Negative.  Negative for congestion, ear discharge,  ear pain, hearing loss, nosebleeds, sinus pain, sore throat and tinnitus.   Eyes: Negative for blurred vision, double vision, photophobia, pain, discharge and redness.  Respiratory: Positive for shortness of breath. Negative for cough, hemoptysis, sputum production, wheezing and stridor.   Cardiovascular: Negative for chest pain, palpitations, orthopnea, claudication, leg swelling and PND.  Gastrointestinal: Negative.  Negative for abdominal pain, blood in stool, constipation, diarrhea, heartburn, melena, nausea and vomiting.  Genitourinary: Negative.  Negative for dysuria, flank pain, frequency, hematuria and urgency.  Musculoskeletal: Negative.  Negative for back pain, falls, joint pain, myalgias and neck pain.  Skin: Negative for itching and rash.  Neurological: Positive for weakness. Negative  for dizziness, tingling, tremors, sensory change, speech change, focal weakness, seizures, loss of consciousness and headaches.  Endo/Heme/Allergies: Negative for environmental allergies and polydipsia. Does not bruise/bleed easily.  Psychiatric/Behavioral: Negative.  Negative for depression, hallucinations, memory loss, substance abuse and suicidal ideas. The patient is not nervous/anxious and does not have insomnia.     Vital Signs: Blood pressure 110/71, pulse (!) 116, temperature 98.5 F (36.9 C), temperature source Oral, resp. rate (!) 23, height 5\' 11"  (1.803 m), weight 77.2 kg (170 lb 3.1 oz), SpO2 100 %.  Weight trends: Filed Weights   06/08/16 1006 06/08/16 1431  Weight: 74.9 kg (165 lb 2 oz) 77.2 kg (170 lb 3.1 oz)    Physical Exam: General: NAD, laying in bed  Head: Normocephalic, atraumatic. Moist oral mucosal membranes  Eyes: Anicteric, PERRL  Neck: Supple, trachea midline  Lungs:  Clear to auscultation  Heart: tachycardia  Abdomen:  Soft, nontender,   Extremities: no peripheral edema. Tender right first toe  Neurologic: Nonfocal, moving all four extremities  Skin: No lesions        Lab results: Basic Metabolic Panel:  Recent Labs Lab 06/08/16 1018  NA 135  K 5.1  CL 104  CO2 7*  GLUCOSE 184*  BUN 43*  CREATININE 2.80*  CALCIUM 9.0    Liver Function Tests:  Recent Labs Lab 06/08/16 1018  AST 654*  ALT 472*  ALKPHOS 67  BILITOT 2.5*  PROT 7.8  ALBUMIN 3.8    Recent Labs Lab 06/08/16 1018  LIPASE 31   No results for input(s): AMMONIA in the last 168 hours.  CBC:  Recent Labs Lab 06/08/16 1018  WBC 14.8*  NEUTROABS 12.6*  HGB 3.7*  HCT 15.3*  MCV 61.6*  PLT 425    Cardiac Enzymes:  Recent Labs Lab 06/08/16 1018  TROPONINI 13.32*    BNP: Invalid input(s): POCBNP  CBG:  Recent Labs Lab 06/08/16 1429  GLUCAP 102*    Microbiology: No results found for this or any previous visit.  Coagulation Studies: No  results for input(s): LABPROT, INR in the last 72 hours.  Urinalysis: No results for input(s): COLORURINE, LABSPEC, PHURINE, GLUCOSEU, HGBUR, BILIRUBINUR, KETONESUR, PROTEINUR, UROBILINOGEN, NITRITE, LEUKOCYTESUR in the last 72 hours.  Invalid input(s): APPERANCEUR    Imaging: Dg Chest Portable 1 View  Result Date: 06/08/2016 CLINICAL DATA:  Dyspnea EXAM: PORTABLE CHEST 1 VIEW COMPARISON:  None. FINDINGS: Cardiac shadow is within normal limits. The lungs are well aerated bilaterally. No focal infiltrate or sizable effusion is seen. No bony abnormality is noted. IMPRESSION: No acute abnormality seen. Electronically Signed   By: Inez Catalina M.D.   On: 06/08/2016 11:00   US Abdomen Limited Ruq  Result Date: 06/08/2016 CLINICAL DATA:  71 year old male with elevated LFTs. EXAM: US ABDOMEN LIMITED - RIGHT UPPER QUADRANT COMPARISON:  None.  FINDINGS: Gallbladder: Gallbladder sludge identified. There is no evidence of cholelithiasis or acute cholecystitis. Common bile duct: Diameter: 3 mm. There is no evidence of intrahepatic or extrahepatic biliary dilatation. Liver: Slightly increased hepatic echogenicity likely represents hepatic steatosis. No other hepatic abnormalities identified. IMPRESSION: No evidence of acute abnormality. Slightly increased hepatic echogenicity suggesting hepatic steatosis. Gallbladder sludge. No evidence of cholelithiasis or acute cholecystitis. Electronically Signed   By: Margarette Canada M.D.   On: 06/08/2016 13:45      Assessment & Plan: [name] is a 71 y.o. black male with hypertension, peripheral vascular disease, who was admitted to [facility] on 06/08/2016   1. Acute Renal Failure with metabolic acidosis and hyponatremia: secondary to prerenal azotemia from severe anemia This could all be explained by a slow GI bleed that has lead to cardiac demand ischemia and shocked liver. However there is concern for a hemolytic process.  - Start IV bicarb gtt - check renal  ultrasound - Check urinalysis, SPEP/UPEP, uric acid, free light chains  2. Hypertension: hypotensive. On NTG gtt.   3. Anemia: microcytic. With elevated LDH. LDH could be elevated due to cardiac ischemia but concerning for hemolysis.  Reports no history of alcohol abuse.  - Recommend hematology consult. May need peripehral smear. - Check ferritin, folate and vitamin B12. Check PT/INR and aPPT  4. Liver injury: elevated transaminases. Could also explain elevated LDH.   5. Gout: exam not consistent with podagra.  - Checking uric acid. Systemic steriods should help.   LOS: 0 Dmitry Macomber 5/24/20183:32 PM

## 2016-06-08 NOTE — ED Notes (Signed)
Bair Hugger placed on patient due to rectal temperature of 96.4

## 2016-06-08 NOTE — ED Provider Notes (Signed)
[name] Emergency Department Provider Note  ____________________________________________  Time seen: Approximately 11:37 AM  I have reviewed the triage vital signs and the nursing notes.   HISTORY  Chief Complaint Shortness of Breath Level 5 caveat:  Portions of the history and physical were unable to be obtained due to the patient's acute illness    HPI Tristan [name] is a 71 y.o. male brought to the ED due to shortness of breath. EMS report a sat of 87 radiate percent. Patient denies any medical history other than gout. Denies bloody stool. Denies any falls or trauma. No fever or chills.Complains of right foot pain related to gout.     History reviewed. No pertinent past medical history. Gout  There are no active problems to display for this patient.    No past surgical history on file.   Prior to Admission medications   Not on File  None   Allergies Patient has no known allergies.   No family history on file.  Social History Social History  Substance Use Topics  . Smoking status: Not on file  . Smokeless tobacco: Not on file  . Alcohol use Not on file  No tobacco alcohol use  Review of Systems  Constitutional:   No fever or chills.  ENT:   No sore throat. No rhinorrhea. Cardiovascular:   No chest pain or syncope. Respiratory:   Positive shortness of breath without cough Gastrointestinal:   Negative for abdominal pain, vomiting and diarrhea.  Musculoskeletal:   Positive right foot pain, patient feels that his gout All other systems reviewed and are negative except as documented above in ROS and HPI.  ____________________________________________   PHYSICAL EXAM:  VITAL SIGNS: ED Triage Vitals  Enc Vitals Group     BP 06/08/16 1011 119/82     Pulse --      Resp 06/08/16 1011 (!) 29     Temp 06/08/16 1013 97.6 F (36.4 C)     Temp Source 06/08/16 1013 Oral     SpO2 06/08/16 1003 96 %     Weight 06/08/16 1006  165 lb 2 oz (74.9 kg)     Height 06/08/16 1006 5\' 11"  (1.803 m)     Head Circumference --      Peak Flow --      Pain Score 06/08/16 1005 7     Pain Loc --      Pain Edu? --      Excl. in [place]? --     Vital signs reviewed, nursing assessments reviewed.   Constitutional:   Alert and oriented. Mild respiratory distress. Eyes:   No scleral icterus. No conjunctival pallor. PERRL. EOMI.  No nystagmus. ENT   Head:   Normocephalic and atraumatic.   Nose:   No congestion/rhinnorhea.    Mouth/Throat:   Dry because membranes, no pharyngeal erythema. No peritonsillar mass.    Neck:   No meningismus. Full ROM Hematological/Lymphatic/Immunilogical:   No cervical lymphadenopathy. Cardiovascular:   Tachycardia heart rate 110. Symmetric bilateral radial and DP pulses.  No murmurs.  Respiratory:  Tachypnea, normal work of breathing. Breath sounds are clear and equal bilaterally. No wheezes/rales/rhonchi. Gastrointestinal:   Soft and nontender. Non distended. There is no CVA tenderness.  No rebound, rigidity, or guarding. Rectal exam reveals brown stool, Hemoccult negative, controls okay Genitourinary:   deferred Musculoskeletal:   Normal range of motion in all extremities. No joint effusions.  No lower extremity tenderness.  No edema. Neurologic:   Normal  speech and language.  CN 2-10 normal. Motor grossly intact. No gross focal neurologic deficits are appreciated.  Skin:    Skin is warm, dry and intact. No rash noted.  No petechiae, purpura, or bullae.  ____________________________________________    LABS (pertinent positives/negatives) (all labs ordered are listed, but only abnormal results are displayed) Labs Reviewed  COMPREHENSIVE METABOLIC PANEL - Abnormal; Notable for the following:       Result Value   CO2 7 (*)    Glucose, Bld 184 (*)    BUN 43 (*)    Creatinine, Ser 2.80 (*)    AST 654 (*)    ALT 472 (*)    Total Bilirubin 2.5 (*)    GFR calc non Af Amer 21 (*)     GFR calc Af Amer 25 (*)    Anion gap 24 (*)    All other components within normal limits  TROPONIN I - Abnormal; Notable for the following:    Troponin I 13.32 (*)    All other components within normal limits  LACTIC ACID, PLASMA - Abnormal; Notable for the following:    Lactic Acid, Venous 14.5 (*)    All other components within normal limits  CBC WITH DIFFERENTIAL/PLATELET - Abnormal; Notable for the following:    WBC 14.8 (*)    RBC 2.49 (*)    Hemoglobin 3.7 (*)    HCT 15.3 (*)    MCV 61.6 (*)    MCH 15.0 (*)    MCHC 24.4 (*)    RDW 23.7 (*)    nRBC 8 (*)    Neutro Abs 12.6 (*)    Lymphs Abs 0.9 (*)    Monocytes Absolute 1.2 (*)    All other components within normal limits  ETHANOL  LIPASE, BLOOD  LACTIC ACID, PLASMA  PATHOLOGIST SMEAR REVIEW  TYPE AND SCREEN  PREPARE RBC (CROSSMATCH)  ABO/RH  PREPARE RBC (CROSSMATCH)   ____________________________________________   EKG  Interpreted by me Sinus tachycardia rate 108, normal axis and intervals. Normal QRS. ST depression in V4. T-wave inversions in lateral leads.  ____________________________________________    [id number]  Dg Chest Portable 1 View  Result Date: 06/08/2016 CLINICAL DATA:  Dyspnea EXAM: PORTABLE CHEST 1 VIEW COMPARISON:  None. FINDINGS: Cardiac shadow is within normal limits. The lungs are well aerated bilaterally. No focal infiltrate or sizable effusion is seen. No bony abnormality is noted. IMPRESSION: No acute abnormality seen. Electronically Signed   By: Inez Catalina M.D.   On: 06/08/2016 11:00    ____________________________________________   PROCEDURES Procedures CRITICAL CARE Performed by: Joni Fears, Calvin Jablonowski   Total critical care time: 35 minutes  Critical care time was exclusive of separately billable procedures and treating other patients.  Critical care was necessary to treat or prevent imminent or life-threatening deterioration.  Critical care was time spent personally by me on  the following activities: development of treatment plan with patient and/or surrogate as well as nursing, discussions with consultants, evaluation of patient's response to treatment, examination of patient, obtaining history from patient or surrogate, ordering and performing treatments and interventions, ordering and review of laboratory studies, ordering and review of radiographic studies, pulse oximetry and re-evaluation of patient's condition.  ____________________________________________   INITIAL IMPRESSION / ASSESSMENT AND PLAN / ED COURSE  Pertinent labs & imaging results that were available during my care of the patient were reviewed by me and considered in my medical decision making (see chart for details).   Clinical Course as of Jun 09 1155  Thu Jun 08, 2016  1013 Shortness of breath hypoxia tachycardia. Will get chest x-Medard and labs, may need CT angiogram to evaluate for pulmonary embolism. Sacral pain appears inconsequential at this time. IV fluids for hydration.  [PS]  1141 Troponin of 13 appears to be infarction from severe anemia. Would not anticoagulate at this time as risk of further blood loss would be catastrophic for the patient. Troponin I: (!!) 13.32 [PS]  1153 Discussed with cardiology Dr. Yancey Flemings, agrees with holding anticoagulation for now, recommend starting low-dose nitrates to improve coronary perfusion  [PS]    Clinical Course User Index [PS] Carrie Mew, MD     ----------------------------------------- 11:57 AM on 06/08/2016 -----------------------------------------  Discussed with hospitalist for admission. Dr. Humphrey Rolls recommends keeping systolic blood pressure greater than 100 on low-dose nitroglycerin drip.  ____________________________________________   FINAL CLINICAL IMPRESSION(S) / ED DIAGNOSES  Final diagnoses:  Hypovolemic shock (HCC)  Anemia, unspecified type  Non-ST elevation (NSTEMI) myocardial infarction [facility])  Shock liver      New  Prescriptions   No medications on file     Portions of this note were generated with dragon dictation software. Dictation errors may occur despite best attempts at proofreading.    Carrie Mew, MD 06/08/16 [phone]

## 2016-06-08 NOTE — ED Notes (Signed)
Spoke with Dr. Chancy Milroy in regards to nitro drip. States to increase drip as much as possible while maintaining SBP 100 or more.

## 2016-06-08 NOTE — ED Triage Notes (Signed)
Pt arrived via EMS from home for reports of increased work of breath and SOB that began yesterday. EMS reports initial SpO2 87-88% on RA. NRB at 15L placed and SpO2 increased to 96%. Pt reports low back and leg pain.

## 2016-06-08 NOTE — Progress Notes (Signed)
Hinton Dyer, NP notified of critical lactic acid. No new orders. Wilnette Kales

## 2016-06-09 ENCOUNTER — Inpatient Hospital Stay: Payer: Medicare Other

## 2016-06-09 ENCOUNTER — Inpatient Hospital Stay
Admit: 2016-06-09 | Discharge: 2016-06-09 | Disposition: A | Discharge status: Home / Self Care | Payer: Medicare Other | Attending: Internal Medicine | Admitting: Internal Medicine

## 2016-06-09 DIAGNOSIS — R0602 Shortness of breath: Secondary | ICD-10-CM

## 2016-06-09 DIAGNOSIS — R944 Abnormal results of kidney function studies: Secondary | ICD-10-CM

## 2016-06-09 DIAGNOSIS — N289 Disorder of kidney and ureter, unspecified: Secondary | ICD-10-CM

## 2016-06-09 DIAGNOSIS — I214 Non-ST elevation (NSTEMI) myocardial infarction: Principal | ICD-10-CM

## 2016-06-09 DIAGNOSIS — D720 Genetic anomalies of leukocytes: Secondary | ICD-10-CM

## 2016-06-09 DIAGNOSIS — I350 Nonrheumatic aortic (valve) stenosis: Secondary | ICD-10-CM

## 2016-06-09 DIAGNOSIS — D509 Iron deficiency anemia, unspecified: Secondary | ICD-10-CM

## 2016-06-09 DIAGNOSIS — F5089 Other specified eating disorder: Secondary | ICD-10-CM

## 2016-06-09 DIAGNOSIS — R5383 Other fatigue: Secondary | ICD-10-CM

## 2016-06-09 DIAGNOSIS — R7989 Other specified abnormal findings of blood chemistry: Secondary | ICD-10-CM

## 2016-06-09 LAB — CBC
HCT: 24.2 % — ABNORMAL LOW (ref 40.0–52.0)
Hemoglobin: 7.7 g/dL — ABNORMAL LOW (ref 13.0–18.0)
MCH: 21.5 pg — AB (ref 26.0–34.0)
MCHC: 31.9 g/dL — ABNORMAL LOW (ref 32.0–36.0)
MCV: 67.5 fL — AB (ref 80.0–100.0)
Platelets: 214 10*3/uL (ref 150–440)
RBC: 3.59 MIL/uL — AB (ref 4.40–5.90)
RDW: 27.3 % — ABNORMAL HIGH (ref 11.5–14.5)
WBC: 15.7 10*3/uL — ABNORMAL HIGH (ref 3.8–10.6)

## 2016-06-09 LAB — RETICULOCYTES
RBC.: 3.63 MIL/uL — ABNORMAL LOW (ref 4.40–5.90)
Retic Count, Absolute: 58.1 10*3/uL (ref 19.0–183.0)
Retic Ct Pct: 1.6 % (ref 0.4–3.1)

## 2016-06-09 LAB — PROTEIN ELECTROPHORESIS, SERUM
A/G Ratio: 1 (ref 0.7–1.7)
ALPHA-1-GLOBULIN: 0.4 g/dL (ref 0.0–0.4)
ALPHA-2-GLOBULIN: 0.7 g/dL (ref 0.4–1.0)
Albumin ELP: 3.4 g/dL (ref 2.9–4.4)
Beta Globulin: 1 g/dL (ref 0.7–1.3)
GAMMA GLOBULIN: 1.4 g/dL (ref 0.4–1.8)
Globulin, Total: 3.5 g/dL (ref 2.2–3.9)
TOTAL PROTEIN ELP: 6.9 g/dL (ref 6.0–8.5)

## 2016-06-09 LAB — COMPREHENSIVE METABOLIC PANEL
ALT: 773 U/L — ABNORMAL HIGH (ref 17–63)
AST: 1280 U/L — ABNORMAL HIGH (ref 15–41)
Albumin: 3.4 g/dL — ABNORMAL LOW (ref 3.5–5.0)
Alkaline Phosphatase: 60 U/L (ref 38–126)
Anion gap: 6 (ref 5–15)
BILIRUBIN TOTAL: 5.6 mg/dL — AB (ref 0.3–1.2)
BUN: 46 mg/dL — ABNORMAL HIGH (ref 6–20)
CALCIUM: 8.5 mg/dL — AB (ref 8.9–10.3)
CO2: 22 mmol/L (ref 22–32)
CREATININE: 2.44 mg/dL — AB (ref 0.61–1.24)
Chloride: 106 mmol/L (ref 101–111)
GFR, EST AFRICAN AMERICAN: 29 mL/min — AB (ref >60)
GFR, EST NON AFRICAN AMERICAN: 25 mL/min — AB (ref >60)
Glucose, Bld: 184 mg/dL — ABNORMAL HIGH (ref 65–99)
Potassium: 4.8 mmol/L (ref 3.5–5.1)
Sodium: 134 mmol/L — ABNORMAL LOW (ref 135–145)
TOTAL PROTEIN: 6.9 g/dL (ref 6.5–8.1)

## 2016-06-09 LAB — HEPATITIS PANEL, ACUTE
HCV Ab: 0.1 s/co ratio (ref 0.0–0.9)
Hep A IgM: NEGATIVE
Hep B C IgM: NEGATIVE
Hepatitis B Surface Ag: NEGATIVE

## 2016-06-09 LAB — KAPPA/LAMBDA LIGHT CHAINS
Kappa free light chain: 99 mg/L — ABNORMAL HIGH (ref 3.3–19.4)
Kappa, lambda light chain ratio: 2.09 — ABNORMAL HIGH (ref 0.26–1.65)
Lambda free light chains: 47.4 mg/L — ABNORMAL HIGH (ref 5.7–26.3)

## 2016-06-09 LAB — DAT, POLYSPECIFIC AHG (ARMC ONLY): Polyspecific AHG test: NEGATIVE

## 2016-06-09 LAB — ECHOCARDIOGRAM COMPLETE
Height: 71 in
Weight: 2723.12 oz

## 2016-06-09 LAB — HEMOGLOBIN AND HEMATOCRIT, BLOOD
HEMATOCRIT: 24.5 % — AB (ref 40.0–52.0)
Hemoglobin: 7.6 g/dL — ABNORMAL LOW (ref 13.0–18.0)

## 2016-06-09 LAB — LACTIC ACID, PLASMA: Lactic Acid, Venous: 3.2 mmol/L (ref 0.5–1.9)

## 2016-06-09 MED ORDER — CARVEDILOL 6.25 MG PO TABS
6.2500 mg | ORAL_TABLET | Freq: Two times a day (BID) | ORAL | Status: DC
  Administered 2016-06-09 – 2016-06-12 (×6): 6.25 mg via ORAL
  Filled 2016-06-09 (×7): qty 1

## 2016-06-09 MED ORDER — ALLOPURINOL 100 MG PO TABS
100.0000 mg | ORAL_TABLET | Freq: Every day | ORAL | Status: DC
  Administered 2016-06-09 – 2016-06-12 (×4): 100 mg via ORAL
  Filled 2016-06-09 (×4): qty 1

## 2016-06-09 NOTE — Clinical Social Work Note (Signed)
Patient informed staff in ICU that he had concerns about transportation for future follow up appointments. CSW has provided multiple transportation resources for patient. Shela Leff MSW,LCSW [phone]

## 2016-06-09 NOTE — Progress Notes (Addendum)
Venous statis ulcer on right shin. 2.5 cm by 2.5 cm.  Draining yellow exudate. Cleaned with NS.  Wet (NS) to dry dressing applied.  Wound consult requested.

## 2016-06-09 NOTE — Progress Notes (Signed)
Initial Nutrition Assessment  DOCUMENTATION CODES:   Not applicable  INTERVENTION:  1. No interventions warranted at this time  NUTRITION DIAGNOSIS:   Inadequate oral intake related to poor appetite, other (see comment) (Shock liver) as evidenced by per patient/family report.  GOAL:   Patient will meet greater than or equal to 90% of their needs  MONITOR:   PO intake, Labs, I & O's, Skin, Weight trends, Diet advancement  REASON FOR ASSESSMENT:   Malnutrition Screening Tool    ASSESSMENT:   71 year old male who does not seek regular medical care, now presents with severe symptomatic anemia, elevated liver enzymes, and NSTEMI .  Spoke with Tristan [name] at bedside. He reports PTA he went 3-4 days without eating anything related to his present illness. States he may have lost 5-6# during that time. Indicates that last time he came to [facility] he lost a significant amount of weight, was ~138# when he left, exhibiting significant wt gain in the past 1.5 years Appetite is good currently, consuming 100% of CLD, no acute complaints. Nutrition-Focused physical exam completed. Findings are moderate fat depletion, moderate muscle depletion, and moderate edema.  Labs and medications reviewed: Na 134, TBili 5.6 Solumedrol NaHCO3 w/ D5 @ 40mL/hr --> 306 calories Colace  Diet Order:  Diet clear liquid Room service appropriate? Yes; Fluid consistency: Thin  Skin:  Reviewed, no issues  Last BM:  PTA  Height:   Ht Readings from Last 1 Encounters:  06/08/16 5\' 11"  (1.803 m)    Weight:   Wt Readings from Last 1 Encounters:  06/08/16 170 lb 3.1 oz (77.2 kg)    Ideal Body Weight:  78.18 kg  BMI:  Body mass index is 23.74 kg/m.  Estimated Nutritional Needs:   Kcal:  1500-1900 calories  Protein:  77-93 gm  Fluid:  >/= 1.5L  EDUCATION NEEDS:   No education needs identified at this time  Tristan Anis. Nikola Blackston, MS, RD LDN Inpatient Clinical Dietitian Pager [phone]

## 2016-06-09 NOTE — Progress Notes (Signed)
Transferred from ICU.  Patient is alert and oriented.  On room air but has expiratory wheezing.  Fluids with Bicarb continues.  Foley catheter is patent. Patient is wearing an unusual bracelet that he says relieved his leg cramp this morning, and he continues to wear it to prevent further cramps.

## 2016-06-09 NOTE — Progress Notes (Signed)
Report called to nurse on 2A.

## 2016-06-09 NOTE — Progress Notes (Signed)
[facility]  ROUNDING NOTE   Subjective:   4 units PRBC last night.   States his chest pain, toe pain and shortness of breath are improving.   NTG gtt.   Liver enzymes rising.   Echo EF 25%  Objective:  Vital signs in last 24 hours:  Temp:  [96.2 F (35.7 C)-98.9 F (37.2 C)] 97 F (36.1 C) (05/25 0800) Pulse Rate:  [90-125] 92 (05/25 0800) Resp:  [17-38] 19 (05/25 0800) BP: (92-143)/(39-98) 116/89 (05/25 0800) SpO2:  [96 %-100 %] 97 % (05/25 0800) Weight:  [77.2 kg (170 lb 3.1 oz)] 77.2 kg (170 lb 3.1 oz) (05/24 1431)  Weight change:  Filed Weights   06/08/16 1006 06/08/16 1431  Weight: 74.9 kg (165 lb 2 oz) 77.2 kg (170 lb 3.1 oz)    Intake/Output: I/O last 3 completed shifts: In: 4318.5 [I.V.:1242.5; Blood:1076; IV Piggyback:2000] Out: 750 [Urine:750]   Intake/Output this shift:  Total I/O In: 85.5 [I.V.:85.5] Out: 100 [Urine:100]  Physical Exam: General: NAD, laying in bed  Head: Normocephalic, atraumatic. Moist oral mucosal membranes  Eyes: Anicteric, PERRL  Neck: Supple, trachea midline  Lungs:  Clear to auscultation  Heart: Regular rate and rhythm  Abdomen:  Soft, nontender,   Extremities: no peripheral edema.  Neurologic: Nonfocal, moving all four extremities  Skin: No lesions       Basic Metabolic Panel:  Recent Labs Lab 06/08/16 1018 06/09/16 0206  NA 135 134*  K 5.1 4.8  CL 104 106  CO2 7* 22  GLUCOSE 184* 184*  BUN 43* 46*  CREATININE 2.80* 2.44*  CALCIUM 9.0 8.5*    Liver Function Tests:  Recent Labs Lab 06/08/16 1018 06/09/16 0206  AST 654* 1,280*  ALT 472* 773*  ALKPHOS 67 60  BILITOT 2.5* 5.6*  PROT 7.8 6.9  ALBUMIN 3.8 3.4*    Recent Labs Lab 06/08/16 1018  LIPASE 31   No results for input(s): AMMONIA in the last 168 hours.  CBC:  Recent Labs Lab 06/08/16 1018 06/08/16 1501 06/08/16 2058 06/09/16 0206 06/09/16 0751  WBC 14.8*  --   --  15.7*  --   NEUTROABS 12.6*  --   --   --   --    HGB 3.7* 5.8* 7.6* 7.7* 7.6*  HCT 15.3* 21.0* 24.9* 24.2* 24.5*  MCV 61.6*  --   --  67.5*  --   PLT 425  --   --  214  --     Cardiac Enzymes:  Recent Labs Lab 06/08/16 1018  TROPONINI 13.32*    BNP: Invalid input(s): POCBNP  CBG:  Recent Labs Lab 06/08/16 1429  GLUCAP 102*    Microbiology: Results for orders placed or performed during the hospital encounter of 06/08/16  MRSA PCR Screening     Status: None   Collection Time: 06/08/16  2:33 PM  Result Value Ref Range Status   MRSA by PCR NEGATIVE NEGATIVE Final    Comment:        The GeneXpert MRSA Assay (FDA approved for NASAL specimens only), is one component of a comprehensive MRSA colonization surveillance program. It is not intended to diagnose MRSA infection nor to guide or monitor treatment for MRSA infections.     Coagulation Studies:  Recent Labs  06/08/16 1600  LABPROT 20.0*  INR 1.68    Urinalysis:  Recent Labs  06/08/16 [place] 1.009  PHURINE 5.0  GLUCOSEU NEGATIVE  HGBUR LARGE*  BILIRUBINUR NEGATIVE  Benjamin Stain  NEGATIVE  PROTEINUR 30*  NITRITE NEGATIVE  LEUKOCYTESUR LARGE*      Imaging: Dg Chest Port 1 View  Result Date: 06/08/2016 CLINICAL DATA:  Weakness and shortness of breath for 3 days. EXAM: PORTABLE CHEST 1 VIEW COMPARISON:  Single-view of the chest 06/08/2016. FINDINGS: The lungs are clear. Heart size is normal. No pneumothorax or pleural effusion. No acute bony abnormality. IMPRESSION: No acute disease. Electronically Signed   By: Tristan [name] M.D.   On: 06/08/2016 15:33   Dg Chest Portable 1 View  Result Date: 06/08/2016 CLINICAL DATA:  Dyspnea EXAM: PORTABLE CHEST 1 VIEW COMPARISON:  None. FINDINGS: Cardiac shadow is within normal limits. The lungs are well aerated bilaterally. No focal infiltrate or sizable effusion is seen. No bony abnormality is noted. IMPRESSION: No acute abnormality seen. Electronically Signed   By: Tristan [name]  M.D.   On: 06/08/2016 11:00   US Abdomen Limited Ruq  Result Date: 06/08/2016 CLINICAL DATA:  71 year old male with elevated LFTs. EXAM: US ABDOMEN LIMITED - RIGHT UPPER QUADRANT COMPARISON:  None. FINDINGS: Gallbladder: Gallbladder sludge identified. There is no evidence of cholelithiasis or acute cholecystitis. Common bile duct: Diameter: 3 mm. There is no evidence of intrahepatic or extrahepatic biliary dilatation. Liver: Slightly increased hepatic echogenicity likely represents hepatic steatosis. No other hepatic abnormalities identified. IMPRESSION: No evidence of acute abnormality. Slightly increased hepatic echogenicity suggesting hepatic steatosis. Gallbladder sludge. No evidence of cholelithiasis or acute cholecystitis. Electronically Signed   By: Tristan [name] M.D.   On: 06/08/2016 13:45     Medications:   . sodium chloride Stopped (06/08/16 1702)  . nitroGLYCERIN 15 mcg/min (06/09/16 0811)  .  sodium bicarbonate  infusion 1000 mL 75 mL/hr at 06/09/16 0800   . budesonide (PULMICORT) nebulizer solution  0.5 mg Nebulization BID  . carvedilol  6.25 mg Oral BID WC  . methylPREDNISolone (SOLU-MEDROL) injection  40 mg Intravenous Q12H  . pantoprazole (PROTONIX) IV  40 mg Intravenous Q12H   docusate sodium, ipratropium-albuterol  Assessment/ Plan:  [name] is a 71 y.o. black male hypertension, peripheral vascular disease, who was admitted to [facility] on 06/08/2016   1. Acute Renal Failure with metabolic acidosis, proteinuria, hematuria and hyponatremia: secondary to prerenal azotemia from severe anemia - Improved with IV fluids and PRBC transfusions - Continue IV sodium bicarbonate for another 24 hours.  - Pending renal ultrasound - Pending SPEP/UPEP, free light chains  2. Hypertension: hypotensive. On NTG gtt.  - started on carvedilol.   3. Anemia: microcytic. With elevated LDH.  Status post 4 units PRBC 5/24 - Appreciate GI input.   4. Liver injury: elevated  transaminases. Could also explain elevated LDH.   5. Gout:   - Checking uric acid. Consider allopurinol.    LOS: 1 Tristan [name] 5/25/201810:18 AM

## 2016-06-09 NOTE — Progress Notes (Signed)
SUBJECTIVE: Patient is feeling much better no chest pain or shortness of breath or dizziness   Vitals:   06/09/16 0500 06/09/16 0600 06/09/16 0700 06/09/16 0800  BP: 92/64 100/72 96/67 116/89  Pulse: 90 93 90 92  Resp: (!) 29 (!) 24 18 19   Temp:    97 F (36.1 C)  TempSrc:    Axillary  SpO2: 98% 97% 97% 97%  Weight:      Height:        Intake/Output Summary (Last 24 hours) at 06/09/16 0846 Last data filed at 06/09/16 0800  Gross per 24 hour  Intake          4403.95 ml  Output              850 ml  Net          3553.95 ml    LABS: Basic Metabolic Panel:  Recent Labs  06/08/16 1018 06/09/16 0206  NA 135 134*  K 5.1 4.8  CL 104 106  CO2 7* 22  GLUCOSE 184* 184*  BUN 43* 46*  CREATININE 2.80* 2.44*  CALCIUM 9.0 8.5*   Liver Function Tests:  Recent Labs  06/08/16 1018 06/09/16 0206  AST 654* 1,280*  ALT 472* 773*  ALKPHOS 67 60  BILITOT 2.5* 5.6*  PROT 7.8 6.9  ALBUMIN 3.8 3.4*    Recent Labs  06/08/16 1018  LIPASE 31   CBC:  Recent Labs  06/08/16 1018  06/09/16 0206 06/09/16 0751  WBC 14.8*  --  15.7*  --   NEUTROABS 12.6*  --   --   --   HGB 3.7*  < > 7.7* 7.6*  HCT 15.3*  < > 24.2* 24.5*  MCV 61.6*  --  67.5*  --   PLT 425  --  214  --   < > = values in this interval not displayed. Cardiac Enzymes:  Recent Labs  06/08/16 1018  TROPONINI 13.32*   BNP: Invalid input(s): POCBNP D-Dimer: No results for input(s): DDIMER in the last 72 hours. Hemoglobin A1C: No results for input(s): HGBA1C in the last 72 hours. Fasting Lipid Panel: No results for input(s): CHOL, HDL, LDLCALC, TRIG, CHOLHDL, LDLDIRECT in the last 72 hours. Thyroid Function Tests:  Recent Labs  06/08/16 1505  TSH 3.181   Anemia Panel:  Recent Labs  06/08/16 1309 06/08/16 1600 06/09/16 0751  VITAMINB12  --  1,122*  --   FOLATE  --  20.8  --   FERRITIN  --  7*  --   TIBC 428  --   --   IRON 22*  --   --   RETICCTPCT  --   --  1.6     PHYSICAL  EXAM General: Well developed, well nourished, in no acute distress HEENT:  Normocephalic and atramatic Neck:  No JVD.  Lungs: Clear bilaterally to auscultation and percussion. Heart: HRRR . Normal S1 and S2 without gallops or murmurs.  Abdomen: Bowel sounds are positive, abdomen soft and non-tender  Msk:  Back normal, normal gait. Normal strength and tone for age. Extremities: No clubbing, cyanosis or edema.   Neuro: Alert and oriented X 3. Psych:  Good affect, responds appropriately  TELEMETRY: Sinus rhythm with sinus tachycardia  ASSESSMENT AND PLAN: Patient has multiorgan failure with non-STEMI and echocardiogram showing severe aortic stenosis and left ventricular ejection fraction 21-25%. Patient however has renal failure as well as severely elevated liver function tests as well as lactic acidosis as well as severe anemia  etiology of which is not clear. I don't think patient is a candidate for aortic valve replacement or cardiac catheterization at this time. Hemoglobin is much better though it was 3 and now is 7. Advise getting hemoglobin to 9 or 10. Advise workup for severe anemia and severely elevated liver function tests. May need lifevest and treatment medically for his cardiomyopathy and coronary artery disease. Advise adding Corag 6.25 twice a day and baby aspirin.  Principal Problem:   Symptomatic anemia Active Problems:   Acute renal failure (ARF) (HCC)   Hypothermia   NSTEMI (non-ST elevated myocardial infarction) (HCC)    Neoma Laming A, MD, [facility] 06/09/2016 8:46 AM

## 2016-06-09 NOTE — Progress Notes (Signed)
[place] [facility] [place] NAME: Tristan [name]    MR#:  [id number]  DATE OF BIRTH:  Aug 26, 1945  SUBJECTIVE:  Patient is a poor historian. Reports coming in with shortness of breath found to be severely anemic and dehydrated and in shock liver with elevated LFTs. He is not able to give me any details about his symptoms. Denies any chest pain or shortness of breath today. No family in the room  REVIEW OF SYSTEMS:   Review of Systems  Constitutional: Negative for chills, fever and weight loss.  HENT: Negative for ear discharge, ear pain and nosebleeds.   Eyes: Negative for blurred vision, pain and discharge.  Respiratory: Negative for sputum production, shortness of breath, wheezing and stridor.   Cardiovascular: Negative for chest pain, palpitations, orthopnea and PND.  Gastrointestinal: Negative for abdominal pain, diarrhea, nausea and vomiting.  Genitourinary: Negative for frequency and urgency.  Musculoskeletal: Negative for back pain and joint pain.  Neurological: Positive for weakness. Negative for sensory change, speech change and focal weakness.  Psychiatric/Behavioral: Negative for depression and hallucinations. The patient is not nervous/anxious.    Tolerating Diet:yesTolerating PT: pending  DRUG ALLERGIES:  No Known Allergies  VITALS:  Blood pressure 98/68, pulse 93, temperature 98.2 F (36.8 C), temperature source Oral, resp. rate 18, height 5\' 11"  (1.803 m), weight 77.2 kg (170 lb 3.1 oz), SpO2 97 %.  PHYSICAL EXAMINATION:   Physical Exam  GENERAL:  71 y.o.-year-old patient lying in the bed with no acute distress.  EYES: Pupils equal, round, reactive to light and accommodation. No scleral icterus.pallor+ Extraocular muscles intact.  HEENT: Head atraumatic, normocephalic. Oropharynx and nasopharynx clear.  NECK:  Supple, no jugular venous distention. No thyroid enlargement, no tenderness.  LUNGS: Normal breath sounds  bilaterally, no wheezing, rales, rhonchi. No use of accessory muscles of respiration.  CARDIOVASCULAR: S1, S2 normal. No murmurs, rubs, or gallops.  ABDOMEN: Soft, nontender, nondistended. Bowel sounds present. No organomegaly or mass.  EXTREMITIES: No cyanosis, clubbing or edema b/l.    NEUROLOGIC: Cranial nerves II through XII are intact. No focal Motor or sensory deficits b/l.   PSYCHIATRIC:  patient is alert and oriented x 3.  SKIN: No obvious rash, lesion, or ulcer.   LABORATORY PANEL:  CBC  Recent Labs Lab 06/09/16 0206 06/09/16 0751  WBC 15.7*  --   HGB 7.7* 7.6*  HCT 24.2* 24.5*  PLT 214  --     Chemistries   Recent Labs Lab 06/09/16 0206  NA 134*  K 4.8  CL 106  CO2 22  GLUCOSE 184*  BUN 46*  CREATININE 2.44*  CALCIUM 8.5*  AST 1,280*  ALT 773*  ALKPHOS 60  BILITOT 5.6*   Cardiac Enzymes  Recent Labs Lab 06/08/16 1018  TROPONINI 13.32*   RADIOLOGY:  US Renal  Result Date: 06/09/2016 CLINICAL DATA:  71 year old male with acute renal failure. EXAM: RENAL / URINARY TRACT ULTRASOUND COMPLETE COMPARISON:  Right upper quadrant ultrasound 06/08/2016 FINDINGS: Right Kidney: Length: 10.5 cm. Echogenicity within normal limits. No mass or hydronephrosis visualized. Left Kidney: Length: 10.0 cm. Echogenicity within normal limits. No mass or hydronephrosis visualized. Bladder: Decompressed with a Foley catheter balloon visible within the bladder (image 28). Other findings: Hepatic steatosis re - demonstrated. Confluent echogenic sludge suspected throughout the neck and lower body of the gallbladder (image 12). IMPRESSION: 1. Normal sonographic appearance of both kidneys. 2. Urinary bladder decompressed by a Foley catheter. 3. Hepatic steatosis and extensive gallbladder  sludge re- demonstrated. Electronically Signed   By: Genevie Ann M.D.   On: 06/09/2016 11:56   Dg Chest Port 1 View  Result Date: 06/08/2016 CLINICAL DATA:  Weakness and shortness of breath for 3 days.  EXAM: PORTABLE CHEST 1 VIEW COMPARISON:  Single-view of the chest 06/08/2016. FINDINGS: The lungs are clear. Heart size is normal. No pneumothorax or pleural effusion. No acute bony abnormality. IMPRESSION: No acute disease. Electronically Signed   By: Inge Rise M.D.   On: 06/08/2016 15:33   Dg Chest Portable 1 View  Result Date: 06/08/2016 CLINICAL DATA:  Dyspnea EXAM: PORTABLE CHEST 1 VIEW COMPARISON:  None. FINDINGS: Cardiac shadow is within normal limits. The lungs are well aerated bilaterally. No focal infiltrate or sizable effusion is seen. No bony abnormality is noted. IMPRESSION: No acute abnormality seen. Electronically Signed   By: Inez Catalina M.D.   On: 06/08/2016 11:00   US Abdomen Limited Ruq  Result Date: 06/08/2016 CLINICAL DATA:  71 year old male with elevated LFTs. EXAM: US ABDOMEN LIMITED - RIGHT UPPER QUADRANT COMPARISON:  None. FINDINGS: Gallbladder: Gallbladder sludge identified. There is no evidence of cholelithiasis or acute cholecystitis. Common bile duct: Diameter: 3 mm. There is no evidence of intrahepatic or extrahepatic biliary dilatation. Liver: Slightly increased hepatic echogenicity likely represents hepatic steatosis. No other hepatic abnormalities identified. IMPRESSION: No evidence of acute abnormality. Slightly increased hepatic echogenicity suggesting hepatic steatosis. Gallbladder sludge. No evidence of cholelithiasis or acute cholecystitis. Electronically Signed   By: Margarette Canada M.D.   On: 06/08/2016 13:45   ASSESSMENT AND PLAN:   Tristan [name]  is a 71 y.o. male with a known history of Hypertension- came to emergency room for complaint of 3-4 days weakness and shortness of breath. Not giving very clear history but denies any vomiting or blood in the stool. Denies any chest pain. In ER noted to have severe anemia and stool guaiac was negative, also noted to have acute renal failure and elevated troponin, elevated liver function enzymes, elevated lactic acid and  have hypothermia  * Symptomatic anemia -Stool guaiac is negative as per ER physician.  - Microcytic anemia, likely iron deficiency.  -There is also elevated LDH and bilirubin level along with elevation and hepatic enzymes and renal failure. -5.8--- 2 unit blood transfusion--- 7.6--- 7.7 -Seen by GI. Patient does need luminal evaluation however given his acute MI and renal failure not a candidate at present. Monitor hemoglobin and transfuse as needed.  * Acute renal failure appears from regional as a teen and severe anemia  -  IV fluids, continue monitoring, avoid nephrotoxic agents  -nephrology consult appreciated  * Elevated liver enzymes and bilirubin appears secondary to shock liver -Ultrasound gallbladder negative for acute findings. Hepatic steatosis   No active bleed as per ER physician checked guaiac. 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  * Hypothermia  resolved  * Elevated lactic acid   Possibly due to renal failure and severe anemia, continue monitoring with IV fluids. -no source of infection noted  Case discussed with Care Management/Social Worker. Management plans discussed with the patient, family and they are in agreement.  CODE STATUS:Partial DVT Prophylaxis: SCD  TOTAL TIME TAKING  CARE OF THIS PATIENT: *30* minutes.  >50% time spent on counselling and coordination of care  POSSIBLE D/C IN *1-2* DAYS, DEPENDING ON CLINICAL CONDITION.  Note: This dictation was prepared with Dragon dictation along with smaller phrase technology. Any transcriptional errors that result from this process are unintentional.  [place] M.D on 06/09/2016 at 1:32 PM  Between 7am to 6pm -  Pager - [phone]  After 6pm go to [email or web address] - password [email or web address] [facility]  Office  [phone]  CC: Primary care physician; Patient, No Pcp Per

## 2016-06-09 NOTE — Progress Notes (Signed)
[facility] Hematology/Oncology Progress Note  Date of admission: 06/08/2016  Hospital day:  06/09/2016  Chief Complaint: Tristan [name] is a 71 y.o. male with no significant past medical history who was admitted through the emergency room with symptomatic anemia, acute renal insufficiency, NSTEMI, and elevated liver function tests.   Subjective:  Feeling better.  Out of the ICU.  Wheezing today.  Social History: The patient is alone today.  Allergies: No Known Allergies  Scheduled Medications: . allopurinol  100 mg Oral Daily  . budesonide (PULMICORT) nebulizer solution  0.5 mg Nebulization BID  . carvedilol  6.25 mg Oral BID WC  . methylPREDNISolone (SOLU-MEDROL) injection  40 mg Intravenous Q12H  . pantoprazole (PROTONIX) IV  40 mg Intravenous Q12H    Review of Systems: GENERAL:  Feels "better".  No fevers or sweats.  PERFORMANCE STATUS (ECOG):  1 HEENT:  No visual changes, runny nose, sore throat, mouth sores or tenderness. Lungs: Wheezing today with minimal activity.  No shortness of breath or cough.  No hemoptysis. Cardiac:  No chest pain, palpitations, orthopnea, or PND. GI:  No nausea, vomiting, diarrhea, constipation, melena or hematochezia. GU:  Mild dysuria.  No urgency, frequency, or hematuria. Musculoskeletal:  No back pain.  No joint pain.  No muscle tenderness. Extremities:  Gout x 20-30 years.  No pain or swelling. Skin:  No rashes or skin changes. Neuro:  No headache, numbness or weakness, balance or coordination issues. Endocrine:  No diabetes, thyroid issues, hot flashes or night sweats. Psych:  No mood changes, depression or anxiety. Pain:  No focal pain. Review of systems:  All other systems reviewed and found to be negative.  Physical Exam: Blood pressure (!) 87/67, pulse 83, temperature 98.7 F (37.1 C), resp. rate 16, height 5' 11" (1.803 m), weight 170 lb 3.1 oz (77.2 kg), SpO2 100 %.  GENERAL:  Thin gentleman lying comfortably  on the medical unit in no acute distress. MENTAL STATUS:  Alert and oriented to person, place and time. HEAD:  Pearline Cables hair.  Slight gray goatee.  Normocephalic, atraumatic, face symmetric, no Cushingoid features. EYES:  Brown eyes.  Pupils equal round and reactive to light and accomodation.  No conjunctivitis or scleral icterus. ENT:  Oropharynx clear without lesion.  Tongue normal.  Poor dentition.  Mucous membranes moist.  RESPIRATORY:  Diffuse scattered wheezes.  Clear to auscultation without rales or rhonchi. CARDIOVASCULAR:  Regular rate and rhythm without murmur, rub or gallop. ABDOMEN:  Soft, non-tender, with active bowel sounds, and no hepatosplenomegaly.  No masses. SKIN:  No rashes, ulcers or lesions. EXTREMITIES: ICDs in place; left ankle edema..  No skin discoloration or tenderness.  No palpable cords. NEUROLOGICAL: Unremarkable. PSYCH:  Appropriate.   Results for orders placed or performed during the hospital encounter of 06/08/16 (from the past 48 hour(s))  Comprehensive metabolic panel     Status: Abnormal   Collection Time: 06/08/16 10:18 AM  Result Value Ref Range   Sodium 135 135 - 145 mmol/L    Comment: RESULTS VERIFIED BY REPEAT TESTING/HKP   Potassium 5.1 3.5 - 5.1 mmol/L   Chloride 104 101 - 111 mmol/L   CO2 7 (L) 22 - 32 mmol/L   Glucose, Bld 184 (H) 65 - 99 mg/dL   BUN 43 (H) 6 - 20 mg/dL   Creatinine, Ser 2.80 (H) 0.61 - 1.24 mg/dL   Calcium 9.0 8.9 - 10.3 mg/dL   Total Protein 7.8 6.5 - 8.1 g/dL   Albumin 3.8  3.5 - 5.0 g/dL   AST 654 (H) 15 - 41 U/L   ALT 472 (H) 17 - 63 U/L   Alkaline Phosphatase 67 38 - 126 U/L   Total Bilirubin 2.5 (H) 0.3 - 1.2 mg/dL   GFR calc non Af Amer 21 (L) >60 mL/min   GFR calc Af Amer 25 (L) >60 mL/min    Comment: (NOTE) The eGFR has been calculated using the CKD EPI equation. This calculation has not been validated in all clinical situations. eGFR's persistently <60 mL/min signify possible Chronic Kidney Disease.    Anion  gap 24 (H) 5 - 15  Ethanol     Status: None   Collection Time: 06/08/16 10:18 AM  Result Value Ref Range   Alcohol, Ethyl (B) <5 <5 mg/dL    Comment:        LOWEST DETECTABLE LIMIT FOR SERUM ALCOHOL IS 5 mg/dL FOR MEDICAL PURPOSES ONLY   Troponin I     Status: Abnormal   Collection Time: 06/08/16 10:18 AM  Result Value Ref Range   Troponin I 13.32 (HH) <0.03 ng/mL    Comment: CRITICAL RESULT CALLED TO, READ BACK BY AND VERIFIED WITH EMMA HUNTER_0  ON 06/08/16 BY HKP   Lipase, blood     Status: None   Collection Time: 06/08/16 10:18 AM  Result Value Ref Range   Lipase 31 11 - 51 U/L  Lactic acid, plasma     Status: Abnormal   Collection Time: 06/08/16 10:18 AM  Result Value Ref Range   Lactic Acid, Venous 14.5 (HH) 0.5 - 1.9 mmol/L    Comment: CRITICAL RESULT CALLED TO, READ BACK BY AND VERIFIED WITH EMMA HUNTER_1  ON 06/08/16 BY HKP RESULT CONFIRMED BY MANUAL DILUTION   CBC with Differential     Status: Abnormal   Collection Time: 06/08/16 10:18 AM  Result Value Ref Range   WBC 14.8 (H) 3.8 - 10.6 K/uL   RBC 2.49 (L) 4.40 - 5.90 MIL/uL   Hemoglobin 3.7 (LL) 13.0 - 18.0 g/dL    Comment: CRITICAL RESULT CALLED TO, READ BACK BY AND VERIFIED WITH: MARY NEEDHAM AT 1048 ON 06/08/16 ALV    HCT 15.3 (L) 40.0 - 52.0 %   MCV 61.6 (L) 80.0 - 100.0 fL   MCH 15.0 (L) 26.0 - 34.0 pg   MCHC 24.4 (L) 32.0 - 36.0 g/dL   RDW 23.7 (H) 11.5 - 14.5 %   Platelets 425 150 - 440 K/uL   Neutrophils Relative % 85 %   Lymphocytes Relative 6 %   Monocytes Relative 8 %   Eosinophils Relative 0 %   Basophils Relative 1 %   nRBC 8 (H) 0 /100 WBC   Neutro Abs 12.6 (H) 1.4 - 6.5 K/uL   Lymphs Abs 0.9 (L) 1.0 - 3.6 K/uL   Monocytes Absolute 1.2 (H) 0.2 - 1.0 K/uL   Eosinophils Absolute 0.0 0 - 0.7 K/uL   Basophils Absolute 0.1 0 - 0.1 K/uL   RBC Morphology MIXED RBC POPULATION    Smear Review      PATH REVIEW HAS BEEN ORDERED ON THIS ACCESSION NUMBER  Pathologist smear review     Status:  None   Collection Time: 06/08/16 10:18 AM  Result Value Ref Range   Path Review      Peripheral smear review shows microcytic anemia and neutrophilia.  Iron deficiency and infection are considerations. Correlation with clinical findings is required. Dr. Luana Shu.  ABO/Rh     Status: None  Collection Time: 06/08/16 10:18 AM  Result Value Ref Range   ABO/RH(D) B POS   Hepatitis panel, acute     Status: None   Collection Time: 06/08/16 10:18 AM  Result Value Ref Range   Hepatitis B Surface Ag Negative Negative   HCV Ab 0.1 0.0 - 0.9 s/co ratio    Comment: (NOTE)                                  Negative:     < 0.8                             Indeterminate: 0.8 - 0.9                                  Positive:     > 0.9 The CDC recommends that a positive HCV antibody result be followed up with a HCV Nucleic Acid Amplification test (664403). Performed At: [facility] [place], [place] [id number] Lindon Romp MD OV:[id number]    Hep A IgM Negative Negative   Hep B C IgM Negative Negative  Prepare RBC (crossmatch)     Status: None   Collection Time: 06/08/16 11:13 AM  Result Value Ref Range   Order Confirmation ORDER PROCESSED BY BLOOD BANK   Type and screen [place]     Status: None (Preliminary result)   Collection Time: 06/08/16 11:14 AM  Result Value Ref Range   ABO/RH(D) B POS    Antibody Screen NEG    Sample Expiration 06/11/2016    Unit Number [id number]    Blood Component Type RED CELLS,LR    Unit division 00    Status of Unit ISSUED    Transfusion Status OK TO TRANSFUSE    Crossmatch Result COMPATIBLE    Unit tag comment EMERGENCY RELEASE    Unit Number [id number]    Blood Component Type RBC LR PHER1    Unit division 00    Status of Unit ISSUED    Transfusion Status OK TO TRANSFUSE    Crossmatch Result COMPATIBLE    Unit tag comment EMERGENCY RELEASE    Unit Number [id number]    Blood Component Type RED  CELLS,LR    Unit division 00    Status of Unit ISSUED    Transfusion Status OK TO TRANSFUSE    Crossmatch Result Compatible    Unit Number [id number]    Blood Component Type RED CELLS,LR    Unit division 00    Status of Unit ISSUED    Transfusion Status OK TO TRANSFUSE    Crossmatch Result Compatible    Unit Number [id number]    Blood Component Type RED CELLS,LR    Unit division 00    Status of Unit ALLOCATED    Transfusion Status OK TO TRANSFUSE    Crossmatch Result Compatible    Unit Number [id number]    Blood Component Type RED CELLS,LR    Unit division 00    Status of Unit ALLOCATED    Transfusion Status OK TO TRANSFUSE    Crossmatch Result Compatible   Prepare RBC     Status: None   Collection Time: 06/08/16 11:55 AM  Result Value Ref Range   Order Confirmation ORDER PROCESSED BY BLOOD BANK   Lactic  acid, plasma     Status: Abnormal   Collection Time: 06/08/16  1:09 PM  Result Value Ref Range   Lactic Acid, Venous 13.7 (HH) 0.5 - 1.9 mmol/L    Comment: CRITICAL RESULT CALLED TO, READ BACK BY AND VERIFIED WITH BRANDY MANSFIELD RN AT 1450 06/08/16.MSS RESULT CONFIRMED BY MANUAL DILUTION.MSS   Iron and TIBC     Status: Abnormal   Collection Time: 06/08/16  1:09 PM  Result Value Ref Range   Iron 22 (L) 45 - 182 ug/dL   TIBC 428 250 - 450 ug/dL   Saturation Ratios 5 (L) 17.9 - 39.5 %   UIBC 406 ug/dL  Lactate dehydrogenase     Status: Abnormal   Collection Time: 06/08/16  1:09 PM  Result Value Ref Range   LDH 1,357 (H) 98 - 192 U/L  Glucose, capillary     Status: Abnormal   Collection Time: 06/08/16  2:29 PM  Result Value Ref Range   Glucose-Capillary 102 (H) 65 - 99 mg/dL  MRSA PCR Screening     Status: None   Collection Time: 06/08/16  2:33 PM  Result Value Ref Range   MRSA by PCR NEGATIVE NEGATIVE    Comment:        The GeneXpert MRSA Assay (FDA approved for NASAL specimens only), is one component of a comprehensive MRSA colonization surveillance  program. It is not intended to diagnose MRSA infection nor to guide or monitor treatment for MRSA infections.   Hemoglobin and hematocrit, blood     Status: Abnormal   Collection Time: 06/08/16  3:01 PM  Result Value Ref Range   Hemoglobin 5.8 (L) 13.0 - 18.0 g/dL    Comment: RESULT REPEATED AND VERIFIED   HCT 21.0 (L) 40.0 - 52.0 %  Rapid HIV screen (HIV 1/2 Ab+Ag)     Status: None   Collection Time: 06/08/16  3:01 PM  Result Value Ref Range   HIV-1 P24 Antigen - HIV24 NON REACTIVE NON REACTIVE   HIV 1/2 Antibodies NON REACTIVE NON REACTIVE   Interpretation (HIV Ag Ab)      A non reactive test result means that HIV 1 or HIV 2 antibodies and HIV 1 p24 antigen were not detected in the specimen.  Brain natriuretic peptide     Status: Abnormal   Collection Time: 06/08/16  3:05 PM  Result Value Ref Range   B Natriuretic Peptide 3,078.0 (H) 0.0 - 100.0 pg/mL  TSH     Status: None   Collection Time: 06/08/16  3:05 PM  Result Value Ref Range   TSH 3.181 0.350 - 4.500 uIU/mL    Comment: Performed by a 3rd Generation assay with a functional sensitivity of <=0.01 uIU/mL.  Protime-INR     Status: Abnormal   Collection Time: 06/08/16  4:00 PM  Result Value Ref Range   Prothrombin Time 20.0 (H) 11.4 - 15.2 seconds   INR 1.68   APTT     Status: Abnormal   Collection Time: 06/08/16  4:00 PM  Result Value Ref Range   aPTT 40 (H) 24 - 36 seconds    Comment:        IF BASELINE aPTT IS ELEVATED, SUGGEST PATIENT RISK ASSESSMENT BE USED TO DETERMINE APPROPRIATE ANTICOAGULANT THERAPY.   Ferritin     Status: Abnormal   Collection Time: 06/08/16  4:00 PM  Result Value Ref Range   Ferritin 7 (L) 24 - 336 ng/mL  Uric acid     Status: Abnormal  Collection Time: 06/08/16  4:00 PM  Result Value Ref Range   Uric Acid, Serum 15.4 (H) 4.4 - 7.6 mg/dL  Protein electrophoresis, serum     Status: None   Collection Time: 06/08/16  4:00 PM  Result Value Ref Range   Total Protein ELP 6.9 6.0 - 8.5  g/dL   Albumin ELP 3.4 2.9 - 4.4 g/dL   Alpha-1-Globulin 0.4 0.0 - 0.4 g/dL   Alpha-2-Globulin 0.7 0.4 - 1.0 g/dL   Beta Globulin 1.0 0.7 - 1.3 g/dL   Gamma Globulin 1.4 0.4 - 1.8 g/dL   M-Spike, % Not Observed Not Observed g/dL   SPE Interp. Comment     Comment: (NOTE) The SPE pattern appears essentially unremarkable. Evidence of monoclonal protein is not apparent. Performed At: [facility] [place], [place] [id number] Lindon Romp MD AJ:[id number]    Comment Comment     Comment: (NOTE) Protein electrophoresis scan will follow via computer, mail, or courier delivery.    GLOBULIN, TOTAL 3.5 2.2 - 3.9 g/dL   A/G Ratio 1.0 0.7 - 1.7  Kappa/lambda light chains     Status: Abnormal   Collection Time: 06/08/16  4:00 PM  Result Value Ref Range   Kappa free light chain 99.0 (H) 3.3 - 19.4 mg/L   Lamda free light chains 47.4 (H) 5.7 - 26.3 mg/L   Kappa, lamda light chain ratio 2.09 (H) 0.26 - 1.65    Comment: (NOTE) Performed At: [facility] [place], [place] [id number] Lindon Romp MD AG:[id number]   Folate     Status: None   Collection Time: 06/08/16  4:00 PM  Result Value Ref Range   Folate 20.8 >5.9 ng/mL  Vitamin B12     Status: Abnormal   Collection Time: 06/08/16  4:00 PM  Result Value Ref Range   Vitamin B-12 1,122 (H) 180 - 914 pg/mL    Comment: (NOTE) This assay is not validated for testing neonatal or myeloproliferative syndrome specimens for Vitamin B12 levels. Performed at [facility], [place] [address], [place], [place] [address]   Prepare RBC     Status: None   Collection Time: 06/08/16  4:00 PM  Result Value Ref Range   Order Confirmation ORDER PROCESSED BY BLOOD BANK   Urinalysis, Complete w Microscopic     Status: Abnormal   Collection Time: 06/08/16  6:40 PM  Result Value Ref Range   Color, Urine YELLOW (A) YELLOW   APPearance CLOUDY (A) CLEAR   Specific Gravity, Urine 1.009 1.005 - 1.030    pH 5.0 5.0 - 8.0   Glucose, UA NEGATIVE NEGATIVE mg/dL   Hgb urine dipstick LARGE (A) NEGATIVE   Bilirubin Urine NEGATIVE NEGATIVE   Ketones, ur NEGATIVE NEGATIVE mg/dL   Protein, ur 30 (A) NEGATIVE mg/dL   Nitrite NEGATIVE NEGATIVE   Leukocytes, UA LARGE (A) NEGATIVE   RBC / HPF TOO NUMEROUS TO COUNT 0 - 5 RBC/hpf   WBC, UA TOO NUMEROUS TO COUNT 0 - 5 WBC/hpf   Bacteria, UA FEW (A) NONE SEEN   Squamous Epithelial / LPF 0-5 (A) NONE SEEN   WBC Clumps PRESENT    Mucous PRESENT    Hyaline Casts, UA PRESENT   Hemoglobin and hematocrit, blood     Status: Abnormal   Collection Time: 06/08/16  8:58 PM  Result Value Ref Range   Hemoglobin 7.6 (L) 13.0 - 18.0 g/dL   HCT 24.9 (L) 40.0 - 52.0 %  CBC     Status: Abnormal   Collection Time: 06/09/16  2:06 AM  Result Value Ref Range   WBC 15.7 (H) 3.8 - 10.6 K/uL   RBC 3.59 (L) 4.40 - 5.90 MIL/uL   Hemoglobin 7.7 (L) 13.0 - 18.0 g/dL   HCT 24.2 (L) 40.0 - 52.0 %   MCV 67.5 (L) 80.0 - 100.0 fL   MCH 21.5 (L) 26.0 - 34.0 pg   MCHC 31.9 (L) 32.0 - 36.0 g/dL   RDW 27.3 (H) 11.5 - 14.5 %   Platelets 214 150 - 440 K/uL  Comprehensive metabolic panel     Status: Abnormal   Collection Time: 06/09/16  2:06 AM  Result Value Ref Range   Sodium 134 (L) 135 - 145 mmol/L   Potassium 4.8 3.5 - 5.1 mmol/L   Chloride 106 101 - 111 mmol/L   CO2 22 22 - 32 mmol/L   Glucose, Bld 184 (H) 65 - 99 mg/dL   BUN 46 (H) 6 - 20 mg/dL   Creatinine, Ser 2.44 (H) 0.61 - 1.24 mg/dL   Calcium 8.5 (L) 8.9 - 10.3 mg/dL   Total Protein 6.9 6.5 - 8.1 g/dL   Albumin 3.4 (L) 3.5 - 5.0 g/dL   AST 1,280 (H) 15 - 41 U/L   ALT 773 (H) 17 - 63 U/L   Alkaline Phosphatase 60 38 - 126 U/L   Total Bilirubin 5.6 (H) 0.3 - 1.2 mg/dL   GFR calc non Af Amer 25 (L) >60 mL/min   GFR calc Af Amer 29 (L) >60 mL/min    Comment: (NOTE) The eGFR has been calculated using the CKD EPI equation. This calculation has not been validated in all clinical situations. eGFR's persistently <60  mL/min signify possible Chronic Kidney Disease.    Anion gap 6 5 - 15  DAT, polyspecific, AHG ([facility] only)     Status: None   Collection Time: 06/09/16  2:06 AM  Result Value Ref Range   Polyspecific AHG test NEG   Hemoglobin and hematocrit, blood     Status: Abnormal   Collection Time: 06/09/16  7:51 AM  Result Value Ref Range   Hemoglobin 7.6 (L) 13.0 - 18.0 g/dL   HCT 24.5 (L) 40.0 - 52.0 %  Reticulocytes     Status: Abnormal   Collection Time: 06/09/16  7:51 AM  Result Value Ref Range   Retic Ct Pct 1.6 0.4 - 3.1 %   RBC. 3.63 (L) 4.40 - 5.90 MIL/uL   Retic Count, Manual 58.1 19.0 - 183.0 K/uL  Lactic acid, plasma     Status: Abnormal   Collection Time: 06/09/16 10:12 AM  Result Value Ref Range   Lactic Acid, Venous 3.2 (HH) 0.5 - 1.9 mmol/L    Comment: CRITICAL RESULT CALLED TO, READ BACK BY AND VERIFIED WITH AMEILA BERRY AT 1130 06/09/16 DAS    US Renal  Result Date: 06/09/2016 CLINICAL DATA:  71 year old male with acute renal failure. EXAM: RENAL / URINARY TRACT ULTRASOUND COMPLETE COMPARISON:  Right upper quadrant ultrasound 06/08/2016 FINDINGS: Right Kidney: Length: 10.5 cm. Echogenicity within normal limits. No mass or hydronephrosis visualized. Left Kidney: Length: 10.0 cm. Echogenicity within normal limits. No mass or hydronephrosis visualized. Bladder: Decompressed with a Foley catheter balloon visible within the bladder (image 28). Other findings: Hepatic steatosis re - demonstrated. Confluent echogenic sludge suspected throughout the neck and lower body of the gallbladder (image 12). IMPRESSION: 1. Normal sonographic appearance of both kidneys. 2. Urinary bladder decompressed by  a Foley catheter. 3. Hepatic steatosis and extensive gallbladder sludge re- demonstrated. Electronically Signed   By: Genevie Ann M.D.   On: 06/09/2016 11:56   Dg Chest Port 1 View  Result Date: 06/08/2016 CLINICAL DATA:  Weakness and shortness of breath for 3 days. EXAM: PORTABLE CHEST 1 VIEW  COMPARISON:  Single-view of the chest 06/08/2016. FINDINGS: The lungs are clear. Heart size is normal. No pneumothorax or pleural effusion. No acute bony abnormality. IMPRESSION: No acute disease. Electronically Signed   By: Inge Rise M.D.   On: 06/08/2016 15:33   Dg Chest Portable 1 View  Result Date: 06/08/2016 CLINICAL DATA:  Dyspnea EXAM: PORTABLE CHEST 1 VIEW COMPARISON:  None. FINDINGS: Cardiac shadow is within normal limits. The lungs are well aerated bilaterally. No focal infiltrate or sizable effusion is seen. No bony abnormality is noted. IMPRESSION: No acute abnormality seen. Electronically Signed   By: Inez Catalina M.D.   On: 06/08/2016 11:00   US Abdomen Limited Ruq  Result Date: 06/08/2016 CLINICAL DATA:  71 year old male with elevated LFTs. EXAM: US ABDOMEN LIMITED - RIGHT UPPER QUADRANT COMPARISON:  None. FINDINGS: Gallbladder: Gallbladder sludge identified. There is no evidence of cholelithiasis or acute cholecystitis. Common bile duct: Diameter: 3 mm. There is no evidence of intrahepatic or extrahepatic biliary dilatation. Liver: Slightly increased hepatic echogenicity likely represents hepatic steatosis. No other hepatic abnormalities identified. IMPRESSION: No evidence of acute abnormality. Slightly increased hepatic echogenicity suggesting hepatic steatosis. Gallbladder sludge. No evidence of cholelithiasis or acute cholecystitis. Electronically Signed   By: Margarette Canada M.D.   On: 06/08/2016 13:45    Assessment:  Tristan [name] is a 71 y.o. male with severe iron deficiency anemia admitted with fatigue and shortness of breath.  Microcytic anemia developed in the past 2 years.  Diet appears good.  He has ice pica.  He has never had a colonoscopy or EGD.   Peripheral smear revealed microcytic anemia and neutrophilia.  There were no schistocytes.  Hemoglobin has improved from 3.7 to 7.6 after 4 units of PRBCs.   Work-up on admission confirmed iron deficiency anemia  (ferritin 7; iron saturation 7%).  Normal labs included:  B12, folate, TSH, HIV testing, acute hepatitis panel, [place], SPEP.  Free light chain ration slightly elevated (2.09) of unclear significance.  No evidence of hemolysis.    Suspect elevated creatinine secondary pre-renal azotemia.  Elevated LFTs secondary to shock liver or hepatitis.  Elevated troponins secondary to ischemia induced by severe anemia.  Urinalysis suggests potential source of infection or source of blood loss.  Lactic acid is improving (14.5 to 3.2).  Echo today reveals severe aortic stenosis with an EF of 25%.  Plan: 1.  Hematology:  Slowly developing microcytic anemia over 2 years.  Etiology possibly secondary to GI source.  Stools are guaiac negative.  Agree with outpatient EGD and colonoscopy.  If no GI source, consider GU source secondary to TNTC RBCs.  No current evidence of hemolysis. Transfuse PRBCs to achieve a hemoglobin goal of 9-10.  Discussed initiation of oral iron secondary to current liver dysfunction.  If unable to absorb oral iron or intolerant, discussed possible IV iron in the future.  Follow CBC daily.  Recheck LDH.  Follow-up pending labs: hepatitis B core antibody total.  Call if questions.  Dr. Rogue Bussing on call over the holiday weekend.   Lequita Asal, MD  06/09/2016, 8:00 PM

## 2016-06-09 NOTE — Care Management (Signed)
Paper work for [employer] started with [employer] with [employer]. RNCM will continue to follow.

## 2016-06-09 NOTE — Progress Notes (Signed)
[facility] Consultation      Assessment and Plan:  71 year old male who does not seek regular medical care, now presents with severe symptomatic anemia, elevated liver enzymes, and NSTEMI .  Acute respiratory failure likely secondary to undiagnosed AECOPD  NSTEMI Severe symptomatic anemia-likely chronic secondary to severe anemia  Acute kidney injury-improving Acute lactic acidosis Hypothermia-resolved  Elevated liver enzymes likely secondary to shock liver  Hx: Former Smoker and HTN  P: -Supplemental O2 to maintain map 90% to 94% -Continue scheduled and prn bronchodilator therapy  -Continue iv steroids wean as tolerated  -Prn CXR -Continuous telemetry monitoring -Wean nitroglycerin gtt off  -Trend CBC -Monitor for s/sx of bleeding  -Transfuse for hgb 9-10 -Continue IV Protonix -Trend BMP -Replace electrolytes as indicated  -Monitor UOP -Per nephrology will continue Sodium Bicarb gtt for the next 24 hrs  -Renal US pending  -Cardiology, Hematology, and Gastroenterology consulted appreciate input  -Pt will need outpatient colonoscopy and EGD per GI recommendations.  Per Hematology if no GI source would consider GU source secondary to [facility] RBC's -He will need outpatient PFT's due to smoking hx   -If pt remains stable today will transfer pt out of ICU 06/09/2016 and PCCM will sign off   Allergies:  Patient has no known allergies.  Review of Systems: Positives in BOLD  Gen: Denies fever, chills, weight change, fatigue, night sweats HEENT: Denies blurred vision, double vision, hearing loss, tinnitus, sinus congestion, rhinorrhea, sore throat, neck stiffness, dysphagia PULM: Denies shortness of breath, cough, sputum production, hemoptysis, wheezing CV: Denies chest pain, edema, orthopnea, paroxysmal nocturnal dyspnea, palpitations GI: Denies abdominal pain, nausea, vomiting, diarrhea, hematochezia, melena, constipation, change in bowel habits GU: Denies  dysuria, hematuria, polyuria, oliguria, urethral discharge Endocrine: Denies hot or cold intolerance, polyuria, polyphagia or appetite change Derm: Denies rash, dry skin, scaling or peeling skin change Heme: Denies easy bruising, bleeding, bleeding gums Neuro: Denies headache, numbness, weakness, slurred speech, loss of memory or consciousness  Subjective: Pt resting comfortably no complaints at this time   Physical Examination:   VS: BP 116/89 (BP Location: Left Arm)   Pulse 92   Temp 97 F (36.1 C) (Axillary)   Resp 19   Ht 5\' 11"  (1.803 m)   Wt 77.2 kg (170 lb 3.1 oz)   SpO2 97%   BMI 23.74 kg/m   General Appearance: AA male resting in bed, NAD  Neuro: alert and oriented, follows commands, PERRLA HEENT: supple, no JVD  Pulmonary: clear throughout, even, non labored  Cardiovascular: nsr, s1s2, No M/R/G Abdomen: Benign, Soft, non-tender, +BS x4 Skin: warm, no rashes, no ecchymosis  Extremities: normal, no cyanosis, clubbing    LABORATORY PANEL:   CBC  Recent Labs Lab 06/09/16 0206 06/09/16 0751  WBC 15.7*  --   HGB 7.7* 7.6*  HCT 24.2* 24.5*  PLT 214  --    ------------------------------------------------------------------------------------------------------------------  Chemistries   Recent Labs Lab 06/09/16 0206  NA 134*  K 4.8  CL 106  CO2 22  GLUCOSE 184*  BUN 46*  CREATININE 2.44*  CALCIUM 8.5*  AST 1,280*  ALT 773*  ALKPHOS 60  BILITOT 5.6*   ------------------------------------------------------------------------------------------------------------------  Cardiac Enzymes  Recent Labs Lab 06/08/16 1018  TROPONINI 13.32*   ------------------------------------------------------------  RADIOLOGY:  Dg Chest Port 1 View  Result Date: 06/08/2016 CLINICAL DATA:  Weakness and shortness of breath for 3 days. EXAM: PORTABLE CHEST 1 VIEW COMPARISON:  Single-view of the chest 06/08/2016. FINDINGS: The lungs are clear. Heart size  is normal. No  pneumothorax or pleural effusion. No acute bony abnormality. IMPRESSION: No acute disease. Electronically Signed   By: Inge Rise M.D.   On: 06/08/2016 15:33   Dg Chest Portable 1 View  Result Date: 06/08/2016 CLINICAL DATA:  Dyspnea EXAM: PORTABLE CHEST 1 VIEW COMPARISON:  None. FINDINGS: Cardiac shadow is within normal limits. The lungs are well aerated bilaterally. No focal infiltrate or sizable effusion is seen. No bony abnormality is noted. IMPRESSION: No acute abnormality seen. Electronically Signed   By: Inez Catalina M.D.   On: 06/08/2016 11:00   US Abdomen Limited Ruq  Result Date: 06/08/2016 CLINICAL DATA:  71 year old male with elevated LFTs. EXAM: US ABDOMEN LIMITED - RIGHT UPPER QUADRANT COMPARISON:  None. FINDINGS: Gallbladder: Gallbladder sludge identified. There is no evidence of cholelithiasis or acute cholecystitis. Common bile duct: Diameter: 3 mm. There is no evidence of intrahepatic or extrahepatic biliary dilatation. Liver: Slightly increased hepatic echogenicity likely represents hepatic steatosis. No other hepatic abnormalities identified. IMPRESSION: No evidence of acute abnormality. Slightly increased hepatic echogenicity suggesting hepatic steatosis. Gallbladder sludge. No evidence of cholelithiasis or acute cholecystitis. Electronically Signed   By: Margarette Canada M.D.   On: 06/08/2016 13:45   Marda Stalker, [place] Pager [phone] (please enter 7 digits) PCCM Consult Pager [phone] (please enter 7 digits)

## 2016-06-09 NOTE — Progress Notes (Signed)
Acute systolic dysfunction

## 2016-06-09 NOTE — Progress Notes (Signed)
Pt transferred to room 240 in stable condition by wheel chair. All belongings with patient. Bethel Born, RN at bedside.

## 2016-06-09 NOTE — Progress Notes (Signed)
*  PRELIMINARY RESULTS* Echocardiogram 2D Echocardiogram has been performed.  Sherrie Sport 06/09/2016, 8:55 AM

## 2016-06-10 LAB — COMPREHENSIVE METABOLIC PANEL
ALT: 535 U/L — ABNORMAL HIGH (ref 17–63)
AST: 276 U/L — ABNORMAL HIGH (ref 15–41)
Albumin: 3.1 g/dL — ABNORMAL LOW (ref 3.5–5.0)
Alkaline Phosphatase: 62 U/L (ref 38–126)
Anion gap: 8 (ref 5–15)
BUN: 56 mg/dL — ABNORMAL HIGH (ref 6–20)
CO2: 28 mmol/L (ref 22–32)
Calcium: 8.3 mg/dL — ABNORMAL LOW (ref 8.9–10.3)
Chloride: 98 mmol/L — ABNORMAL LOW (ref 101–111)
Creatinine, Ser: 2.14 mg/dL — ABNORMAL HIGH (ref 0.61–1.24)
GFR calc Af Amer: 34 mL/min — ABNORMAL LOW (ref >60)
GFR calc non Af Amer: 30 mL/min — ABNORMAL LOW (ref >60)
Glucose, Bld: 201 mg/dL — ABNORMAL HIGH (ref 65–99)
Potassium: 4.4 mmol/L (ref 3.5–5.1)
Sodium: 134 mmol/L — ABNORMAL LOW (ref 135–145)
Total Bilirubin: 4.3 mg/dL — ABNORMAL HIGH (ref 0.3–1.2)
Total Protein: 6.5 g/dL (ref 6.5–8.1)

## 2016-06-10 LAB — CBC WITH DIFFERENTIAL/PLATELET
Band Neutrophils: 0 %
Basophils Absolute: 0 10*3/uL (ref 0–0.1)
Basophils Relative: 0 %
Blasts: 0 %
Eosinophils Absolute: 0 10*3/uL (ref 0–0.7)
Eosinophils Relative: 0 %
HCT: 24.3 % — ABNORMAL LOW (ref 40.0–52.0)
Hemoglobin: 7.7 g/dL — ABNORMAL LOW (ref 13.0–18.0)
Lymphocytes Relative: 5 %
Lymphs Abs: 0.8 10*3/uL — ABNORMAL LOW (ref 1.0–3.6)
MCH: 21.2 pg — ABNORMAL LOW (ref 26.0–34.0)
MCHC: 31.8 g/dL — ABNORMAL LOW (ref 32.0–36.0)
MCV: 66.7 fL — ABNORMAL LOW (ref 80.0–100.0)
Metamyelocytes Relative: 0 %
Monocytes Absolute: 0.5 10*3/uL (ref 0.2–1.0)
Monocytes Relative: 3 %
Myelocytes: 0 %
Neutro Abs: 13.9 10*3/uL — ABNORMAL HIGH (ref 1.4–6.5)
Neutrophils Relative %: 92 %
Other: 0 %
Platelets: 169 10*3/uL (ref 150–440)
Promyelocytes Absolute: 0 %
RBC: 3.64 MIL/uL — ABNORMAL LOW (ref 4.40–5.90)
RDW: 29.3 % — ABNORMAL HIGH (ref 11.5–14.5)
WBC: 15.2 10*3/uL — ABNORMAL HIGH (ref 3.8–10.6)
nRBC: 3 /100 WBC — ABNORMAL HIGH

## 2016-06-10 LAB — LACTATE DEHYDROGENASE: LDH: 689 U/L — ABNORMAL HIGH (ref 98–192)

## 2016-06-10 LAB — HEPATITIS B CORE ANTIBODY, TOTAL: Hep B Core Total Ab: NEGATIVE

## 2016-06-10 MED ORDER — IPRATROPIUM-ALBUTEROL 0.5-2.5 (3) MG/3ML IN SOLN
3.0000 mL | RESPIRATORY_TRACT | Status: DC
  Administered 2016-06-10 – 2016-06-11 (×8): 3 mL via RESPIRATORY_TRACT
  Filled 2016-06-10 (×5): qty 3
  Filled 2016-06-10: qty 6
  Filled 2016-06-10: qty 3

## 2016-06-10 MED ORDER — DEXTROSE 5 % IV SOLN
1.0000 g | Freq: Every day | INTRAVENOUS | Status: DC
Start: 2016-06-10 — End: 2016-06-11
  Administered 2016-06-10 – 2016-06-11 (×2): 1 g via INTRAVENOUS
  Filled 2016-06-10 (×2): qty 10

## 2016-06-10 MED ORDER — IRON SUCROSE 20 MG/ML IV SOLN
200.0000 mg | Freq: Once | INTRAVENOUS | Status: AC
  Administered 2016-06-10: 200 mg via INTRAVENOUS
  Filled 2016-06-10: qty 10

## 2016-06-10 NOTE — Progress Notes (Signed)
[facility]  ROUNDING NOTE   Subjective:   Creatinine 2.14 (2.44)  Bicarb gtt at 69mL/hr  Foley catheter: UOP 480 since placed.   Objective:  Vital signs in last 24 hours:  Temp:  [97.9 F (36.6 C)-98.7 F (37.1 C)] 98.3 F (36.8 C) (05/26 0809) Pulse Rate:  [75-98] 75 (05/26 0809) Resp:  [16-20] 18 (05/26 0809) BP: (87-119)/(65-87) 93/65 (05/26 0809) SpO2:  [96 %-100 %] 96 % (05/26 0809)  Weight change:  Filed Weights   06/08/16 1006 06/08/16 1431  Weight: 74.9 kg (165 lb 2 oz) 77.2 kg (170 lb 3.1 oz)    Intake/Output: I/O last 3 completed shifts: In: 4043.1 [P.O.:760; I.V.:2843.1; Blood:240; Other:200] Out: 1006 [Urine:1005; Stool:1]   Intake/Output this shift:  No intake/output data recorded.  Physical Exam: General: NAD, laying in bed  Head: Normocephalic, atraumatic. Moist oral mucosal membranes  Eyes: Anicteric, PERRL  Neck: Supple, trachea midline  Lungs:  Wheezing bilaterally  Heart: Regular rate and rhythm  Abdomen:  Soft, nontender,   Extremities: no peripheral edema.  Neurologic: Nonfocal, moving all four extremities  Skin: No lesions       Basic Metabolic Panel:  Recent Labs Lab 06/08/16 1018 06/09/16 0206 06/10/16 0416  NA 135 134* 134*  K 5.1 4.8 4.4  CL 104 106 98*  CO2 7* 22 28  GLUCOSE 184* 184* 201*  BUN 43* 46* 56*  CREATININE 2.80* 2.44* 2.14*  CALCIUM 9.0 8.5* 8.3*    Liver Function Tests:  Recent Labs Lab 06/08/16 1018 06/09/16 0206 06/10/16 0416  AST 654* 1,280* 276*  ALT 472* 773* 535*  ALKPHOS 67 60 62  BILITOT 2.5* 5.6* 4.3*  PROT 7.8 6.9 6.5  ALBUMIN 3.8 3.4* 3.1*    Recent Labs Lab 06/08/16 1018  LIPASE 31   No results for input(s): AMMONIA in the last 168 hours.  CBC:  Recent Labs Lab 06/08/16 1018 06/08/16 1501 06/08/16 2058 06/09/16 0206 06/09/16 0751 06/10/16 0416  WBC 14.8*  --   --  15.7*  --  15.2*  NEUTROABS 12.6*  --   --   --   --  13.9*  HGB 3.7* 5.8* 7.6* 7.7*  7.6* 7.7*  HCT 15.3* 21.0* 24.9* 24.2* 24.5* 24.3*  MCV 61.6*  --   --  67.5*  --  66.7*  PLT 425  --   --  214  --  169    Cardiac Enzymes:  Recent Labs Lab 06/08/16 1018  TROPONINI 13.32*    BNP: Invalid input(s): POCBNP  CBG:  Recent Labs Lab 06/08/16 1429  GLUCAP 102*    Microbiology: Results for orders placed or performed during the hospital encounter of 06/08/16  MRSA PCR Screening     Status: None   Collection Time: 06/08/16  2:33 PM  Result Value Ref Range Status   MRSA by PCR NEGATIVE NEGATIVE Final    Comment:        The GeneXpert MRSA Assay (FDA approved for NASAL specimens only), is one component of a comprehensive MRSA colonization surveillance program. It is not intended to diagnose MRSA infection nor to guide or monitor treatment for MRSA infections.     Coagulation Studies:  Recent Labs  06/08/16 1600  LABPROT 20.0*  INR 1.68    Urinalysis:  Recent Labs  06/08/16 1840  COLORURINE YELLOW*  LABSPEC 1.009  PHURINE 5.0  GLUCOSEU NEGATIVE  HGBUR LARGE*  BILIRUBINUR NEGATIVE  KETONESUR NEGATIVE  PROTEINUR 30*  NITRITE NEGATIVE  LEUKOCYTESUR LARGE*  Imaging: US Renal  Result Date: 06/09/2016 CLINICAL DATA:  71 year old male with acute renal failure. EXAM: RENAL / URINARY TRACT ULTRASOUND COMPLETE COMPARISON:  Right upper quadrant ultrasound 06/08/2016 FINDINGS: Right Kidney: Length: 10.5 cm. Echogenicity within normal limits. No mass or hydronephrosis visualized. Left Kidney: Length: 10.0 cm. Echogenicity within normal limits. No mass or hydronephrosis visualized. Bladder: Decompressed with a Foley catheter balloon visible within the bladder (image 28). Other findings: Hepatic steatosis re - demonstrated. Confluent echogenic sludge suspected throughout the neck and lower body of the gallbladder (image 12). IMPRESSION: 1. Normal sonographic appearance of both kidneys. 2. Urinary bladder decompressed by a Foley catheter. 3. Hepatic  steatosis and extensive gallbladder sludge re- demonstrated. Electronically Signed   By: Genevie Ann M.D.   On: 06/09/2016 11:56   Dg Chest Port 1 View  Result Date: 06/08/2016 CLINICAL DATA:  Weakness and shortness of breath for 3 days. EXAM: PORTABLE CHEST 1 VIEW COMPARISON:  Single-view of the chest 06/08/2016. FINDINGS: The lungs are clear. Heart size is normal. No pneumothorax or pleural effusion. No acute bony abnormality. IMPRESSION: No acute disease. Electronically Signed   By: Inge Rise M.D.   On: 06/08/2016 15:33   Dg Chest Portable 1 View  Result Date: 06/08/2016 CLINICAL DATA:  Dyspnea EXAM: PORTABLE CHEST 1 VIEW COMPARISON:  None. FINDINGS: Cardiac shadow is within normal limits. The lungs are well aerated bilaterally. No focal infiltrate or sizable effusion is seen. No bony abnormality is noted. IMPRESSION: No acute abnormality seen. Electronically Signed   By: Inez Catalina M.D.   On: 06/08/2016 11:00   US Abdomen Limited Ruq  Result Date: 06/08/2016 CLINICAL DATA:  71 year old male with elevated LFTs. EXAM: US ABDOMEN LIMITED - RIGHT UPPER QUADRANT COMPARISON:  None. FINDINGS: Gallbladder: Gallbladder sludge identified. There is no evidence of cholelithiasis or acute cholecystitis. Common bile duct: Diameter: 3 mm. There is no evidence of intrahepatic or extrahepatic biliary dilatation. Liver: Slightly increased hepatic echogenicity likely represents hepatic steatosis. No other hepatic abnormalities identified. IMPRESSION: No evidence of acute abnormality. Slightly increased hepatic echogenicity suggesting hepatic steatosis. Gallbladder sludge. No evidence of cholelithiasis or acute cholecystitis. Electronically Signed   By: Margarette Canada M.D.   On: 06/08/2016 13:45     Medications:   . sodium chloride Stopped (06/08/16 1702)  . nitroGLYCERIN Stopped (06/09/16 1050)   . allopurinol  100 mg Oral Daily  . budesonide (PULMICORT) nebulizer solution  0.5 mg Nebulization BID  .  carvedilol  6.25 mg Oral BID WC  . methylPREDNISolone (SOLU-MEDROL) injection  40 mg Intravenous Q12H  . pantoprazole (PROTONIX) IV  40 mg Intravenous Q12H   docusate sodium, ipratropium-albuterol  Assessment/ Plan:  [name] is a 71 y.o. black male hypertension, peripheral vascular disease, who was admitted to [facility] on 06/08/2016   1. Acute Renal Failure with metabolic acidosis, proteinuria, hematuria and hyponatremia: secondary to prerenal azotemia from severe anemia Unclear baseline creatinine  - Improved with IV fluids and PRBC transfusions - Discontinue IV sodium bicarbonate for another 24 hours.  - Foley for one more day.   2. Hypertension: hypotensive. Off NTG gtt. Now hypotensive - carvedilol.   3. Anemia: microcytic. Status post 4 units PRBC 5/24. No signs of active hemolysis.  - Appreciate GI and Hematology input.   4. Liver injury: elevated transaminases. Trending downward.   5. Gout:   - allopurinol.    LOS: 2 Wendelyn Kiesling 5/26/20189:04 AM

## 2016-06-10 NOTE — Progress Notes (Signed)
[place] [facility] [place] NAME: Asif Muchow    MR#:  [id number]  DATE OF BIRTH:  10-18-1945  SUBJECTIVE:  Patient is a poor historian. Reports coming in with shortness of breath found to be severely anemic and dehydrated and in shock liver with elevated LFTs. He is not able to give me any details about his symptoms. Denies any chest pain or shortness of breath today. No family in the room  REVIEW OF SYSTEMS:   Review of Systems  Constitutional: Negative for chills, fever and weight loss.  HENT: Negative for ear discharge, ear pain and nosebleeds.   Eyes: Negative for blurred vision, pain and discharge.  Respiratory: Negative for sputum production, shortness of breath, wheezing and stridor.   Cardiovascular: Negative for chest pain, palpitations, orthopnea and PND.  Gastrointestinal: Negative for abdominal pain, diarrhea, nausea and vomiting.  Genitourinary: Negative for frequency and urgency.  Musculoskeletal: Negative for back pain and joint pain.  Neurological: Positive for weakness. Negative for sensory change, speech change and focal weakness.  Psychiatric/Behavioral: Negative for depression and hallucinations. The patient is not nervous/anxious.    Tolerating Diet:yesTolerating PT: pending  DRUG ALLERGIES:  No Known Allergies  VITALS:  Blood pressure (!) 87/61, pulse 76, temperature 98.4 F (36.9 C), temperature source Oral, resp. rate 18, height 5\' 11"  (1.803 m), weight 77.2 kg (170 lb 3.1 oz), SpO2 96 %.  PHYSICAL EXAMINATION:   Physical Exam  GENERAL:  71 y.o.-year-old patient lying in the bed with no acute distress.  EYES: Pupils equal, round, reactive to light and accommodation. No scleral icterus.pallor+ Extraocular muscles intact.  HEENT: Head atraumatic, normocephalic. Oropharynx and nasopharynx clear.  NECK:  Supple, no jugular venous distention. No thyroid enlargement, no tenderness.  LUNGS: Normal breath sounds  bilaterally, no wheezing, rales, rhonchi. No use of accessory muscles of respiration.  CARDIOVASCULAR: S1, S2 normal. No murmurs, rubs, or gallops.  ABDOMEN: Soft, nontender, nondistended. Bowel sounds present. No organomegaly or mass.  EXTREMITIES: No cyanosis, clubbing or edema b/l.    NEUROLOGIC: Cranial nerves II through XII are intact. No focal Motor or sensory deficits b/l.   PSYCHIATRIC:  patient is alert and oriented x 3.  SKIN: No obvious rash, lesion, or ulcer.   LABORATORY PANEL:  CBC  Recent Labs Lab 06/10/16 0416  WBC 15.2*  HGB 7.7*  HCT 24.3*  PLT 169    Chemistries   Recent Labs Lab 06/10/16 0416  NA 134*  K 4.4  CL 98*  CO2 28  GLUCOSE 201*  BUN 56*  CREATININE 2.14*  CALCIUM 8.3*  AST 276*  ALT 535*  ALKPHOS 62  BILITOT 4.3*   Cardiac Enzymes  Recent Labs Lab 06/08/16 1018  TROPONINI 13.32*   RADIOLOGY:  US Renal  Result Date: 06/09/2016 CLINICAL DATA:  71 year old male with acute renal failure. EXAM: RENAL / URINARY TRACT ULTRASOUND COMPLETE COMPARISON:  Right upper quadrant ultrasound 06/08/2016 FINDINGS: Right Kidney: Length: 10.5 cm. Echogenicity within normal limits. No mass or hydronephrosis visualized. Left Kidney: Length: 10.0 cm. Echogenicity within normal limits. No mass or hydronephrosis visualized. Bladder: Decompressed with a Foley catheter balloon visible within the bladder (image 28). Other findings: Hepatic steatosis re - demonstrated. Confluent echogenic sludge suspected throughout the neck and lower body of the gallbladder (image 12). IMPRESSION: 1. Normal sonographic appearance of both kidneys. 2. Urinary bladder decompressed by a Foley catheter. 3. Hepatic steatosis and extensive gallbladder sludge re- demonstrated. Electronically Signed   By: Lemmie Evens  [place] Crane M.D.   On: 06/09/2016 11:56   ASSESSMENT AND PLAN:   Denym Rahimi  is a 71 y.o. male with a known history of Hypertension- came to emergency room for complaint of 3-4 days weakness  and shortness of breath. Not giving very clear history but denies any vomiting or blood in the stool. Denies any chest pain. In ER noted to have severe anemia and stool guaiac was negative, also noted to have acute renal failure and elevated troponin, elevated liver function enzymes, elevated lactic acid and have hypothermia  * Symptomatic anemia -Stool guaiac is negative as per ER physician.  - Microcytic anemia, likely iron deficiency.  -There is also elevated LDH and bilirubin level along with elevation and hepatic enzymes and renal failure. -5.8--- 2 unit blood transfusion--- 7.6--- 7.7 -Seen by GI. Patient does need luminal evaluation however given his acute MI and renal failure not a candidate at present. Monitor hemoglobin and transfuse as needed.  * Acute renal failure appears from regional as a teen and severe anemia  -  IV fluids, continue monitoring, avoid nephrotoxic agents  -nephrology consult appreciated -creat trending down  * Elevated liver enzymes and bilirubin appears secondary to shock liver -Ultrasound gallbladder negative for acute findings. Hepatic steatosis   No active bleed as per ER physician checked guaiac. 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  * Hypothermia  resolved  * Elevated lactic acid   Possibly due to renal failure and severe anemia, continue monitoring with IV fluids. -no source of infection noted  * PT recommends SNF  Case discussed with Care Management/Social Worker. Management plans discussed with the patient, family and they are in agreement.  CODE STATUS:Partial DVT Prophylaxis: SCD  TOTAL TIME TAKING CARE OF THIS PATIENT: *30* minutes.   >50% time spent on counselling and coordination of care  POSSIBLE D/C IN *1-2* DAYS, DEPENDING ON CLINICAL CONDITION.  Note: This dictation was prepared with Dragon dictation along with smaller phrase technology. Any transcriptional errors that result from this process are unintentional.  Tayvin Preslar M.D on 06/10/2016 at 4:51 PM  Between 7am to 6pm - Pager - [phone]  After 6pm go to [email or web address] - password [email or web address] [facility]  Office  [phone]  CC: Primary care physician; Patient, No Pcp Per

## 2016-06-10 NOTE — Progress Notes (Signed)
SUBJECTIVE: Pt is feeling well, no chest pain or shortness of breath.    Vitals:   06/09/16 2001 06/09/16 2047 06/10/16 0416 06/10/16 0809  BP: 92/69  91/65 93/65  Pulse: 82  77 75  Resp:   18 18  Temp:   97.9 F (36.6 C) 98.3 F (36.8 C)  TempSrc:   Oral Oral  SpO2:  100% 99% 96%  Weight:      Height:        Intake/Output Summary (Last 24 hours) at 06/10/16 0904 Last data filed at 06/10/16 0700  Gross per 24 hour  Intake          2691.58 ml  Output              381 ml  Net          2310.58 ml    LABS: Basic Metabolic Panel:  Recent Labs  06/09/16 0206 06/10/16 0416  NA 134* 134*  K 4.8 4.4  CL 106 98*  CO2 22 28  GLUCOSE 184* 201*  BUN 46* 56*  CREATININE 2.44* 2.14*  CALCIUM 8.5* 8.3*   Liver Function Tests:  Recent Labs  06/09/16 0206 06/10/16 0416  AST 1,280* 276*  ALT 773* 535*  ALKPHOS 60 62  BILITOT 5.6* 4.3*  PROT 6.9 6.5  ALBUMIN 3.4* 3.1*    Recent Labs  06/08/16 1018  LIPASE 31   CBC:  Recent Labs  06/08/16 1018  06/09/16 0206 06/09/16 0751 06/10/16 0416  WBC 14.8*  --  15.7*  --  15.2*  NEUTROABS 12.6*  --   --   --  13.9*  HGB 3.7*  < > 7.7* 7.6* 7.7*  HCT 15.3*  < > 24.2* 24.5* 24.3*  MCV 61.6*  --  67.5*  --  66.7*  PLT 425  --  214  --  169  < > = values in this interval not displayed. Cardiac Enzymes:  Recent Labs  06/08/16 1018  TROPONINI 13.32*   BNP: Invalid input(s): POCBNP D-Dimer: No results for input(s): DDIMER in the last 72 hours. Hemoglobin A1C: No results for input(s): HGBA1C in the last 72 hours. Fasting Lipid Panel: No results for input(s): CHOL, HDL, LDLCALC, TRIG, CHOLHDL, LDLDIRECT in the last 72 hours. Thyroid Function Tests:  Recent Labs  06/08/16 1505  TSH 3.181   Anemia Panel:  Recent Labs  06/08/16 1309 06/08/16 1600 06/09/16 0751  VITAMINB12  --  1,122*  --   FOLATE  --  20.8  --   FERRITIN  --  7*  --   TIBC 428  --   --   IRON 22*  --   --   RETICCTPCT  --   --  1.6      PHYSICAL EXAM General: Well developed, well nourished, in no acute distress HEENT:  Normocephalic and atramatic Neck:  No JVD.  Lungs: Clear bilaterally to auscultation and percussion. Heart: HRRR . Normal S1 and S2 without gallops or murmurs.  Abdomen: Bowel sounds are positive, abdomen soft and non-tender  Msk:  Back normal, normal gait. Normal strength and tone for age. Extremities: No clubbing, cyanosis or edema.   Neuro: Alert and oriented X 3. Psych:  Good affect, responds appropriately  TELEMETRY: NSR 79bpm  ASSESSMENT AND PLAN: NSTEMI with multi organ failure and severe aortic stenosis with severe LV dysfunction EF 25%. Creatinine and liver function tests are improving on this morning's labs, hemoglobin is stable. Advise continued treatment of anemia and comorbid conditions,  will consider aortic valve replacement consultation and adding Entresto etc once acute renal failure has improved.   Principal Problem:   Symptomatic anemia Active Problems:   Acute renal failure (ARF) (HCC)   Hypothermia   NSTEMI (non-ST elevated myocardial infarction) ([place])    Jake Bathe, NP-C 06/10/2016 9:04 AM

## 2016-06-10 NOTE — Progress Notes (Signed)
Physical Therapy Evaluation Patient Details Name: Tristan [name] MRN: [id number] DOB: [birth year] Today's Date: 06/10/2016   History of Present Illness  Patient is a 71 y.o. male admitted on 24 May after experiencing HTN and 3-4 days of SOB. Patient underwent blood transfusion, as he was found to have severe anemia, ARF, elevated troponin, elevated liver function enzymes, lactic acid, and hypothermia.  Clinical Impression  Patient is a pleasant male admitted for above listed reasons. Upon evaluation, patient's SBP remained in upper 90s with no complaints of dizziness. Patient required minimal assistance with transfers and 10' ambulation with moderate fatigue following treatment. Because patient lives alone and is not at baseline level of function, it is determined that his impairments of muscle strength/endurance, balance, and cardiovascular endurance will limit his quality of life. Patient will continue to benefit from skilled and progressive PT to return to PLOF with f/u at SNF upon d/c.    Follow Up Recommendations SNF    Equipment Recommendations  Rolling walker with 5" wheels    Recommendations for Other Services       Precautions / Restrictions Precautions Precautions: Fall Restrictions Weight Bearing Restrictions: No      Mobility  Bed Mobility Overal bed mobility: Independent             General bed mobility comments: Patient performed bed mobility independently.  Transfers Overall transfer level: Needs assistance Equipment used: Rolling walker (2 wheeled) Transfers: Sit to/from Stand Sit to Stand: Min assist         General transfer comment: Patient performed sit to stand transfer with minimal assistance and verbal cues for sequencing. Performed stand to sit with good control and SBA.  Ambulation/Gait Ambulation/Gait assistance: Min assist Ambulation Distance (Feet): 10 Feet Assistive device: Rolling walker (2 wheeled)       General Gait Details:  Patient ambulated at decreased cadence, utilizing RW with slight unsteadiness and no LOB.  Stairs            Wheelchair Mobility    Modified Rankin (Stroke Patients Only)       Balance Overall balance assessment: Needs assistance Sitting-balance support: Feet supported Sitting balance-Leahy Scale: Fair     Standing balance support: Bilateral upper extremity supported Standing balance-Leahy Scale: Fair                               Pertinent Vitals/Pain Pain Assessment: No/denies pain    Home Living Family/patient expects to be discharged to:: Private residence Living Arrangements: Alone   Type of Home: House       Home Layout: One level Home Equipment: Crutches      Prior Function Level of Independence: Needs assistance   Gait / Transfers Assistance Needed: Modified independent in household with axillary crutches  ADL's / Homemaking Assistance Needed: Performed by cousin        Hand Dominance        Extremity/Trunk Assessment   Upper Extremity Assessment Upper Extremity Assessment: Generalized weakness    Lower Extremity Assessment Lower Extremity Assessment: Generalized weakness       Communication   Communication: No difficulties  Cognition Arousal/Alertness: Awake/alert Behavior During Therapy: WFL for tasks assessed/performed Overall Cognitive Status: Within Functional Limits for tasks assessed  General Comments      Exercises     Assessment/Plan    PT Assessment Patient needs continued PT services  PT Problem List Decreased strength;Decreased activity tolerance;Decreased balance;Decreased mobility;Decreased knowledge of use of DME;Decreased safety awareness;Cardiopulmonary status limiting activity       PT Treatment Interventions DME instruction;Gait training;Functional mobility training;Therapeutic activities;Therapeutic exercise;Balance  training;Patient/family education    PT Goals (Current goals can be found in the Care Plan section)  Acute Rehab PT Goals Patient Stated Goal: "To get stronger" PT Goal Formulation: With patient Time For Goal Achievement: 06/24/16 Potential to Achieve Goals: Good    Frequency Min 2X/week   Barriers to discharge Decreased caregiver support;Inaccessible home environment      Co-evaluation               AM-PAC PT "6 Clicks" Daily Activity  Outcome Measure Difficulty turning over in bed (including adjusting bedclothes, sheets and blankets)?: A Little Difficulty moving from lying on back to sitting on the side of the bed? : A Little Difficulty sitting down on and standing up from a chair with arms (e.g., wheelchair, bedside commode, etc,.)?: A Little Help needed moving to and from a bed to chair (including a wheelchair)?: A Lot Help needed walking in hospital room?: A Lot Help needed climbing 3-5 steps with a railing? : Total 6 Click Score: 14    End of Session Equipment Utilized During Treatment: Gait belt Activity Tolerance: Patient tolerated treatment well;Patient limited by fatigue Patient left: in bed;with call bell/phone within reach;with bed alarm set;with SCD's reapplied   PT Visit Diagnosis: Muscle weakness (generalized) (M62.81);Difficulty in walking, not elsewhere classified (R26.2)    Time: 1320-1340 PT Time Calculation (min) (ACUTE ONLY): 20 min   Charges:   PT Evaluation $PT Eval Low Complexity: 1 Procedure     PT G Codes:          Dorice Lamas, PT, DPT 06/10/2016, 2:20 PM

## 2016-06-11 LAB — RENAL FUNCTION PANEL
ALBUMIN: 3 g/dL — AB (ref 3.5–5.0)
ANION GAP: 9 (ref 5–15)
BUN: 57 mg/dL — AB (ref 6–20)
CHLORIDE: 95 mmol/L — AB (ref 101–111)
CO2: 27 mmol/L (ref 22–32)
Calcium: 8.3 mg/dL — ABNORMAL LOW (ref 8.9–10.3)
Creatinine, Ser: 1.86 mg/dL — ABNORMAL HIGH (ref 0.61–1.24)
GFR calc Af Amer: 41 mL/min — ABNORMAL LOW (ref >60)
GFR calc non Af Amer: 35 mL/min — ABNORMAL LOW (ref >60)
GLUCOSE: 169 mg/dL — AB (ref 65–99)
PHOSPHORUS: 4.5 mg/dL (ref 2.5–4.6)
POTASSIUM: 4.1 mmol/L (ref 3.5–5.1)
Sodium: 131 mmol/L — ABNORMAL LOW (ref 135–145)

## 2016-06-11 LAB — HEPATIC FUNCTION PANEL
ALK PHOS: 65 U/L (ref 38–126)
ALT: 387 U/L — AB (ref 17–63)
AST: 110 U/L — AB (ref 15–41)
Albumin: 2.9 g/dL — ABNORMAL LOW (ref 3.5–5.0)
BILIRUBIN DIRECT: 1.2 mg/dL — AB (ref 0.1–0.5)
Indirect Bilirubin: 1.8 mg/dL — ABNORMAL HIGH (ref 0.3–0.9)
Total Bilirubin: 3 mg/dL — ABNORMAL HIGH (ref 0.3–1.2)
Total Protein: 6 g/dL — ABNORMAL LOW (ref 6.5–8.1)

## 2016-06-11 MED ORDER — PANTOPRAZOLE SODIUM 40 MG PO TBEC
40.0000 mg | DELAYED_RELEASE_TABLET | Freq: Two times a day (BID) | ORAL | Status: DC
  Administered 2016-06-11 – 2016-06-12 (×2): 40 mg via ORAL
  Filled 2016-06-11 (×2): qty 1

## 2016-06-11 MED ORDER — CEPHALEXIN 500 MG PO CAPS
500.0000 mg | ORAL_CAPSULE | Freq: Two times a day (BID) | ORAL | Status: DC
  Administered 2016-06-11 – 2016-06-12 (×2): 500 mg via ORAL
  Filled 2016-06-11 (×2): qty 1

## 2016-06-11 NOTE — Progress Notes (Signed)
[facility]  ROUNDING NOTE   Subjective:   Creatinine 1.9 (2.14) (2.44)  Patient eating well. Has no complaints today.   Objective:  Vital signs in last 24 hours:  Temp:  [97.9 F (36.6 C)-98.6 F (37 C)] 97.9 F (36.6 C) (05/27 0848) Pulse Rate:  [74-83] 79 (05/27 0848) Resp:  [16-18] 18 (05/27 0848) BP: (87-99)/(61-69) 95/68 (05/27 0848) SpO2:  [93 %-96 %] 93 % (05/27 0848)  Weight change:  Filed Weights   06/08/16 1006 06/08/16 1431  Weight: 74.9 kg (165 lb 2 oz) 77.2 kg (170 lb 3.1 oz)    Intake/Output: I/O last 3 completed shifts: In: 1673.8 [P.O.:360; I.V.:1093.8; IV Piggyback:220] Out: 901 [Urine:900; Stool:1]   Intake/Output this shift:  Total I/O In: 60 [IV Piggyback:60] Out: 225 [Urine:225]  Physical Exam: General: NAD, laying in bed  Head: Normocephalic, atraumatic. Moist oral mucosal membranes  Eyes: Anicteric, PERRL  Neck: Supple, trachea midline  Lungs:  Wheezing bilaterally  Heart: Regular rate and rhythm  Abdomen:  Soft, nontender,   Extremities: no peripheral edema.  Neurologic: Nonfocal, moving all four extremities  Skin: Right shin wound - clean and dry dressings       Basic Metabolic Panel:  Recent Labs Lab 06/08/16 1018 06/09/16 0206 06/10/16 0416 06/11/16 0756  NA 135 134* 134* 131*  K 5.1 4.8 4.4 4.1  CL 104 106 98* 95*  CO2 7* 22 28 27   GLUCOSE 184* 184* 201* 169*  BUN 43* 46* 56* 57*  CREATININE 2.80* 2.44* 2.14* 1.86*  CALCIUM 9.0 8.5* 8.3* 8.3*  PHOS  --   --   --  4.5    Liver Function Tests:  Recent Labs Lab 06/08/16 1018 06/09/16 0206 06/10/16 0416 06/11/16 0756  AST 654* 1,280* 276*  --   ALT 472* 773* 535*  --   ALKPHOS 67 60 62  --   BILITOT 2.5* 5.6* 4.3*  --   PROT 7.8 6.9 6.5  --   ALBUMIN 3.8 3.4* 3.1* 3.0*    Recent Labs Lab 06/08/16 1018  LIPASE 31   No results for input(s): AMMONIA in the last 168 hours.  CBC:  Recent Labs Lab 06/08/16 1018 06/08/16 1501  06/08/16 2058 06/09/16 0206 06/09/16 0751 06/10/16 0416  WBC 14.8*  --   --  15.7*  --  15.2*  NEUTROABS 12.6*  --   --   --   --  13.9*  HGB 3.7* 5.8* 7.6* 7.7* 7.6* 7.7*  HCT 15.3* 21.0* 24.9* 24.2* 24.5* 24.3*  MCV 61.6*  --   --  67.5*  --  66.7*  PLT 425  --   --  214  --  169    Cardiac Enzymes:  Recent Labs Lab 06/08/16 1018  TROPONINI 13.32*    BNP: Invalid input(s): POCBNP  CBG:  Recent Labs Lab 06/08/16 1429  GLUCAP 102*    Microbiology: Results for orders placed or performed during the hospital encounter of 06/08/16  MRSA PCR Screening     Status: None   Collection Time: 06/08/16  2:33 PM  Result Value Ref Range Status   MRSA by PCR NEGATIVE NEGATIVE Final    Comment:        The GeneXpert MRSA Assay (FDA approved for NASAL specimens only), is one component of a comprehensive MRSA colonization surveillance program. It is not intended to diagnose MRSA infection nor to guide or monitor treatment for MRSA infections.     Coagulation Studies:  Recent Labs  06/08/16  1600  LABPROT 20.0*  INR 1.68    Urinalysis:  Recent Labs  06/08/16 1840  COLORURINE YELLOW*  LABSPEC 1.009  PHURINE 5.0  GLUCOSEU NEGATIVE  HGBUR LARGE*  BILIRUBINUR NEGATIVE  KETONESUR NEGATIVE  PROTEINUR 30*  NITRITE NEGATIVE  LEUKOCYTESUR LARGE*      Imaging: US Renal  Result Date: 06/09/2016 CLINICAL DATA:  71 year old male with acute renal failure. EXAM: RENAL / URINARY TRACT ULTRASOUND COMPLETE COMPARISON:  Right upper quadrant ultrasound 06/08/2016 FINDINGS: Right Kidney: Length: 10.5 cm. Echogenicity within normal limits. No mass or hydronephrosis visualized. Left Kidney: Length: 10.0 cm. Echogenicity within normal limits. No mass or hydronephrosis visualized. Bladder: Decompressed with a Foley catheter balloon visible within the bladder (image 28). Other findings: Hepatic steatosis re - demonstrated. Confluent echogenic sludge suspected throughout the neck and  lower body of the gallbladder (image 12). IMPRESSION: 1. Normal sonographic appearance of both kidneys. 2. Urinary bladder decompressed by a Foley catheter. 3. Hepatic steatosis and extensive gallbladder sludge re- demonstrated. Electronically Signed   By: Genevie Ann M.D.   On: 06/09/2016 11:56     Medications:   . cefTRIAXone (ROCEPHIN)  IV 1 g (06/11/16 0855)   . allopurinol  100 mg Oral Daily  . budesonide (PULMICORT) nebulizer solution  0.5 mg Nebulization BID  . carvedilol  6.25 mg Oral BID WC  . ipratropium-albuterol  3 mL Nebulization Q4H  . pantoprazole  40 mg Oral BID   docusate sodium, ipratropium-albuterol  Assessment/ Plan:  Tristan [name] is a 71 y.o. black male hypertension, peripheral vascular disease, who was admitted to [facility] on 06/08/2016   1. Acute Renal Failure with metabolic acidosis, proteinuria, hematuria and hyponatremia: secondary to prerenal azotemia from severe anemia Unclear baseline creatinine  - Creatinine improving. Encourage PO intake. Off IV fluids.   2. Hypertension: hypotensive. Off NTG gtt. With systolic congestive heart failure, EF 25%.  - carvedilol was held  3. Anemia: microcytic. Status post 4 units PRBC 5/24. No signs of active hemolysis.  Hemoglobin 7.7 - Appreciate GI and Hematology input.   4. Liver injury: elevated transaminases. Liver enzymes not checked today.   5. Gout: no more complaints.   - allopurinol.    LOS: 3 Zoanne Newill 5/27/201810:05 AM

## 2016-06-11 NOTE — Consult Note (Signed)
[facility] Nurse wound consult note Reason for Consult:Full thickness wound at anterior aspect of right LE. Location is not typical of a venous leg ulcer, but patient with mild edema and faint hemosiderin staining. Patient states he has had this wound for several months. Wound type: Venous insufficiency vs trauma Pressure Injury POA: No Measurement: 2cm x 1cm x 0.2cm Wound bed: pale pink with dried serum at periphery Drainage (amount, consistency, odor) scant serous Periwound: darkening of the intact epidermis. Dressing procedure/placement/frequency: Nursing is provided with guidance for the topical care of this full thickness wound using a hydrogel.  We will begin with this daily in house, but it can be used three times weekly upon discharge.  Suggest follow up with PCP in the community and a [facility] for initial dressing application, instruction on caring for the wound between visits and reportable signs and symptoms and monitoring of wound progress.  If you agree, please order [facility] services. [facility] nursing team will not follow, but will remain available to this patient, the nursing and medical teams.  Please re-consult if needed. Thanks, Maudie Flakes, MSN, RN, [place], Arther Abbott  Pager# [phone]

## 2016-06-11 NOTE — Progress Notes (Signed)
PHARMACIST - PHYSICIAN COMMUNICATION  CONCERNING: IV to Oral Route Change Policy  RECOMMENDATION: This patient is receiving pantoprazole by the intravenous route.  Based on criteria approved by the Pharmacy and Therapeutics Committee, the intravenous medication(s) is/are being converted to the equivalent oral dose form(s).   DESCRIPTION: These criteria include:  The patient is eating (either orally or via tube) and/or has been taking other orally administered medications for a least 24 hours  The patient has no evidence of active gastrointestinal bleeding or impaired GI absorption (gastrectomy, short bowel, patient on TNA or NPO).   Olivia Canter, [facility] 06/11/2016 9:04 AM

## 2016-06-11 NOTE — Progress Notes (Signed)
[facility] [place] NAME: Kadarrius Yanke    MR#:  [id number]  DATE OF BIRTH:  May 22, 1945  SUBJECTIVE:  Denies any chest pain or shortness of breath today. No family in the room  REVIEW OF SYSTEMS:   Review of Systems  Constitutional: Negative for chills, fever and weight loss.  HENT: Negative for ear discharge, ear pain and nosebleeds.   Eyes: Negative for blurred vision, pain and discharge.  Respiratory: Negative for sputum production, shortness of breath, wheezing and stridor.   Cardiovascular: Negative for chest pain, palpitations, orthopnea and PND.  Gastrointestinal: Negative for abdominal pain, diarrhea, nausea and vomiting.  Genitourinary: Negative for frequency and urgency.  Musculoskeletal: Negative for back pain and joint pain.  Neurological: Positive for weakness. Negative for sensory change, speech change and focal weakness.  Psychiatric/Behavioral: Negative for depression and hallucinations. The patient is not nervous/anxious.    Tolerating Diet:yesTolerating PT: SNF  DRUG ALLERGIES:  No Known Allergies  VITALS:  Blood pressure 94/62, pulse 79, temperature 97.9 F (36.6 C), temperature source Tympanic, resp. rate 18, height 5\' 11"  (1.803 m), weight 77.2 kg (170 lb 3.1 oz), SpO2 96 %.  PHYSICAL EXAMINATION:   Physical Exam  GENERAL:  71 y.o.-year-old patient lying in the bed with no acute distress.  EYES: Pupils equal, round, reactive to light and accommodation. No scleral icterus.pallor+ Extraocular muscles intact.  HEENT: Head atraumatic, normocephalic. Oropharynx and nasopharynx clear.  NECK:  Supple, no jugular venous distention. No thyroid enlargement, no tenderness.  LUNGS: Normal breath sounds bilaterally, no wheezing, rales, rhonchi. No use of accessory muscles of respiration.  CARDIOVASCULAR: S1, S2 normal. No murmurs, rubs, or gallops.  ABDOMEN: Soft, nontender, nondistended. Bowel sounds present. No  organomegaly or mass.  EXTREMITIES: No cyanosis, clubbing or edema b/l.    NEUROLOGIC: Cranial nerves II through XII are intact. No focal Motor or sensory deficits b/l.   PSYCHIATRIC:  patient is alert and oriented x 3.  SKIN: No obvious rash, lesion, or ulcer.   LABORATORY PANEL:  CBC  Recent Labs Lab 06/10/16 0416  WBC 15.2*  HGB 7.7*  HCT 24.3*  PLT 169    Chemistries   Recent Labs Lab 06/11/16 0756  NA 131*  K 4.1  CL 95*  CO2 27  GLUCOSE 169*  BUN 57*  CREATININE 1.86*  CALCIUM 8.3*  AST 110*  ALT 387*  ALKPHOS 65  BILITOT 3.0*   Cardiac Enzymes  Recent Labs Lab 06/08/16 1018  TROPONINI 13.32*   RADIOLOGY:  No results found. ASSESSMENT AND PLAN:   Siah Steely  is a 71 y.o. male with a known history of Hypertension- came to emergency room for complaint of 3-4 days weakness and shortness of breath. Not giving very clear history but denies any vomiting or blood in the stool. Denies any chest pain. In ER noted to have severe anemia and stool guaiac was negative, also noted to have acute renal failure and elevated troponin, elevated liver function enzymes, elevated lactic acid and have hypothermia  * Symptomatic anemia -Stool guaiac is negative as per ER physician.  - Microcytic anemia, likely iron deficiency.  -There is also elevated LDH and bilirubin level along with elevation and hepatic enzymes and renal failure. -5.8--- 2 unit blood transfusion--- 7.6--- 7.7 -Seen by GI. Patient does need luminal evaluation however given his acute MI and renal failure not a candidate at present. Monitor hemoglobin and transfuse as needed.  * Acute renal failure appears  from regional as a teen and severe anemia  -  IV fluids, continue monitoring, avoid nephrotoxic agents  -nephrology consult appreciated -creat trending down  * Elevated liver enzymes and bilirubin appears secondary to shock liver -Ultrasound gallbladder negative for acute findings. Hepatic steatosis    No active bleed as per ER physician checked guaiac. 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  * Hypothermia  resolved  * Elevated lactic acid   Possibly due to renal failure and severe anemia, continue monitoring with IV fluids. -no source of infection noted  * PT recommends SNF  Case discussed with Care Management/Social Worker. Management plans discussed with the patient, family and they are in agreement.  CODE STATUS:Partial DVT Prophylaxis: SCD  TOTAL TIME TAKING CARE OF THIS PATIENT: *30* minutes.  >50% time spent on counselling and coordination of care  POSSIBLE D/C IN *1-2* DAYS, DEPENDING ON CLINICAL CONDITION.  Note: This dictation was prepared with Dragon dictation along with smaller phrase technology. Any transcriptional errors that result from this process are unintentional.  Graysen Depaula M.D on 06/11/2016 at 1:59 PM  Between 7am to 6pm - Pager - [phone]  After 6pm go to [email or web address] - password [email or web address] [facility]  Office  [phone]  CC: Primary care physician; Patient, No Pcp Per

## 2016-06-12 LAB — TYPE AND SCREEN
ABO/RH(D): B POS
ANTIBODY SCREEN: NEGATIVE
UNIT DIVISION: 0
UNIT DIVISION: 0
UNIT DIVISION: 0
UNIT DIVISION: 0
Unit division: 0
Unit division: 0

## 2016-06-12 LAB — BPAM RBC
BLOOD PRODUCT EXPIRATION DATE: 201806032359
BLOOD PRODUCT EXPIRATION DATE: 201806062359
BLOOD PRODUCT EXPIRATION DATE: 201806062359
Blood Product Expiration Date: 201806062359
Blood Product Expiration Date: 201806222359
Blood Product Expiration Date: 201806232359
ISSUE DATE / TIME: 201805241804
ISSUE DATE / TIME: 201805241159
ISSUE DATE / TIME: 201805241159
ISSUE DATE / TIME: 201805241608
UNIT TYPE AND RH: 1700
UNIT TYPE AND RH: 5100
UNIT TYPE AND RH: 7300
UNIT TYPE AND RH: 7300
Unit Type and Rh: 5100
Unit Type and Rh: 7300

## 2016-06-12 MED ORDER — ASPIRIN EC 81 MG PO TBEC: 81.0000 mg | DELAYED_RELEASE_TABLET | Freq: Every day | ORAL | 2 refills | Status: AC

## 2016-06-12 MED ORDER — CARVEDILOL 6.25 MG PO TABS: 6.2500 mg | ORAL_TABLET | Freq: Two times a day (BID) | ORAL | 0 refills | Status: DC

## 2016-06-12 MED ORDER — PANTOPRAZOLE SODIUM 40 MG PO TBEC: 40.0000 mg | DELAYED_RELEASE_TABLET | Freq: Every day | ORAL | 1 refills | Status: DC

## 2016-06-12 MED ORDER — CEPHALEXIN 500 MG PO CAPS: 500.0000 mg | ORAL_CAPSULE | Freq: Two times a day (BID) | ORAL | 0 refills | Status: DC

## 2016-06-12 MED ORDER — ALLOPURINOL 100 MG PO TABS: 100.0000 mg | ORAL_TABLET | Freq: Every day | ORAL | 1 refills | Status: AC

## 2016-06-12 NOTE — Progress Notes (Signed)
SUBJECTIVE: Has cough  Vitals:   06/11/16 1629 06/11/16 1932 06/11/16 2037 06/12/16 0508  BP: 102/71 102/63  (!) 99/58  Pulse: 82 81  77  Resp:  16  15  Temp:  98.7 F (37.1 C)  98.6 F (37 C)  TempSrc:  Oral  Oral  SpO2:  100% 96% 98%  Weight:      Height:        Intake/Output Summary (Last 24 hours) at 06/12/16 0828 Last data filed at 06/12/16 0100  Gross per 24 hour  Intake              780 ml  Output             2575 ml  Net            -1795 ml    LABS: Basic Metabolic Panel:  Recent Labs  06/10/16 0416 06/11/16 0756  NA 134* 131*  K 4.4 4.1  CL 98* 95*  CO2 28 27  GLUCOSE 201* 169*  BUN 56* 57*  CREATININE 2.14* 1.86*  CALCIUM 8.3* 8.3*  PHOS  --  4.5   Liver Function Tests:  Recent Labs  06/10/16 0416 06/11/16 0756  AST 276* 110*  ALT 535* 387*  ALKPHOS 62 65  BILITOT 4.3* 3.0*  PROT 6.5 6.0*  ALBUMIN 3.1* 2.9*  3.0*   No results for input(s): LIPASE, AMYLASE in the last 72 hours. CBC:  Recent Labs  06/10/16 0416  WBC 15.2*  NEUTROABS 13.9*  HGB 7.7*  HCT 24.3*  MCV 66.7*  PLT 169   Cardiac Enzymes: No results for input(s): CKTOTAL, CKMB, CKMBINDEX, TROPONINI in the last 72 hours. BNP: Invalid input(s): POCBNP D-Dimer: No results for input(s): DDIMER in the last 72 hours. Hemoglobin A1C: No results for input(s): HGBA1C in the last 72 hours. Fasting Lipid Panel: No results for input(s): CHOL, HDL, LDLCALC, TRIG, CHOLHDL, LDLDIRECT in the last 72 hours. Thyroid Function Tests: No results for input(s): TSH, T4TOTAL, T3FREE, THYROIDAB in the last 72 hours.  Invalid input(s): FREET3 Anemia Panel: No results for input(s): VITAMINB12, FOLATE, FERRITIN, TIBC, IRON, RETICCTPCT in the last 72 hours.   PHYSICAL EXAM General: Well developed, well nourished, in no acute distress HEENT:  Normocephalic and atramatic Neck:  No JVD.  Lungs: Clear bilaterally to auscultation and percussion. Heart: HRRR . Normal S1 and S2 without gallops  or murmurs.  Abdomen: Bowel sounds are positive, abdomen soft and non-tender  Msk:  Back normal, normal gait. Normal strength and tone for age. Extremities: No clubbing, cyanosis or edema.   Neuro: Alert and oriented X 3. Psych:  Good affect, responds appropriately  TELEMETRY: NSR ASSESSMENT AND PLAN: NSTEMI with severe AS and LVEF 25%, not candidatefor cath. May start asp if GI says its ok. Principal Problem:   Symptomatic anemia Active Problems:   Acute renal failure (ARF) (HCC)   Hypothermia   NSTEMI (non-ST elevated myocardial infarction) (HCC)    Neoma Laming A, MD, [facility] 06/12/2016 8:28 AM

## 2016-06-12 NOTE — Care Management (Addendum)
Patient for discharge to skilled nursing today.  There is information in CM hand off report that there is an active life vest referral but no documentation in physician notes.  Spoke with attending and paged cardiology.  Spoke with Ailene Ravel from [place] to discuss how referral was initiated- was it a direct order.  [facility] received a verbal order from the cardiologist.  Patient was approved on Friday but fitting was not scheduled as "thought patient was going to be here for a while."  "It is better for patient to have vest due to low EF" per cardiology.  [facility] can fit device today and informed that [facility] states can accept today if arrives with the device.  Updated attending .

## 2016-06-12 NOTE — Clinical Social Work Placement (Signed)
   CLINICAL SOCIAL WORK PLACEMENT  NOTE  Date:  06/12/2016  Patient Details  Name: Tristan [name] MRN: [id number] Date of Birth: [birth year]  Clinical Social Work is seeking post-discharge placement for this patient at the [place] level of care (*CSW will initial, date and re-position this form in  chart as items are completed):  Yes   Patient/family provided with [place] Work Department's list of facilities offering this level of care within the geographic area requested by the patient (or if unable, by the patient's family).  Yes   Patient/family informed of their freedom to choose among providers that offer the needed level of care, that participate in Medicare, Medicaid or managed care program needed by the patient, have an available bed and are willing to accept the patient.  Yes   Patient/family informed of [facility]'s ownership interest in [facility] and [facility], as well as of the fact that they are under no obligation to receive care at these facilities.  PASRR submitted to EDS on 06/12/16     PASRR number received on 06/12/16     Existing PASRR number confirmed on       FL2 transmitted to all facilities in geographic area requested by pt/family on 06/12/16     FL2 transmitted to all facilities within larger geographic area on       Patient informed that his/her managed care company has contracts with or will negotiate with certain facilities, including the following:        Yes   Patient/family informed of bed offers received.  Patient chooses bed at  ([facility] )     Physician recommends and patient chooses bed at      Patient to be transferred to  ([facility] ) on 06/12/16.  Patient to be transferred to facility by  [facility] )     Patient family notified on 06/12/16 of transfer.  Name of family member notified:   (Patient's son [name] is at bedside and aware of D/C today. )     PHYSICIAN       Additional  Comment:    _______________________________________________ Ruford Dudzinski, Veronia Beets, LCSW 06/12/2016, 11:44 AM

## 2016-06-12 NOTE — NC FL2 (Signed)
  [place] LEVEL OF CARE SCREENING TOOL     IDENTIFICATION  Patient Name: Tristan [name] Birthdate: 02-15-1945 Sex: male Admission Date (Current Location): 06/08/2016  [place] and [place] Number:  Engineering geologist and Address:  [facility], [address] [place], [place] [address]      Provider Number: [id number]  Attending Physician Name and Address:  Fritzi Mandes, MD  Relative Name and Phone Number:       Current Level of Care: Hospital Recommended Level of Care: [place] Prior Approval Number:    Date Approved/Denied:   PASRR Number:  [id number])  Discharge Plan: SNF    Current Diagnoses: Patient Active Problem List   Diagnosis Date Noted  . Symptomatic anemia 06/08/2016  . Acute renal failure (ARF) ([place]) 06/08/2016  . Hypothermia 06/08/2016  . NSTEMI (non-ST elevated myocardial infarction) ([place]) 06/08/2016    Orientation RESPIRATION BLADDER Height & Weight     Self, Time, Situation, Place  Normal Continent Weight: 170 lb 3.1 oz (77.2 kg) Height:  5\' 11"  (180.3 cm)  BEHAVIORAL SYMPTOMS/MOOD NEUROLOGICAL BOWEL NUTRITION STATUS   (none)  (none) Continent Diet (Diet: Clear Liquid to be advanced )  AMBULATORY STATUS COMMUNICATION OF NEEDS Skin   Extensive Assist Verbally Other (Comment) (non-pressure wound venous stasis ulcer leg left. )                       Personal Care Assistance Level of Assistance  Bathing, Feeding, Dressing Bathing Assistance: Limited assistance Feeding assistance: Independent Dressing Assistance: Limited assistance     Functional Limitations Info  Sight, Hearing, Speech Sight Info: Adequate Hearing Info: Adequate Speech Info: Adequate    SPECIAL CARE FACTORS FREQUENCY  PT (By licensed PT), OT (By licensed OT)     PT Frequency:  (5) OT Frequency:  (5)            Contractures      Additional Factors Info  Code Status, Allergies Code Status Info:   (partial code) Allergies Info:  (no known allergies. )           Current Medications (06/12/2016):  This is the current hospital active medication list Current Facility-Administered Medications  Medication Dose Route Frequency Provider Last Rate Last Dose  . allopurinol (ZYLOPRIM) tablet 100 mg  100 mg Oral Daily Kolluru, Sarath, MD   100 mg at 06/12/16 0943  . carvedilol (COREG) tablet 6.25 mg  6.25 mg Oral BID WC Neoma Laming A, MD   6.25 mg at 06/12/16 0851  . cephALEXin (KEFLEX) capsule 500 mg  500 mg Oral Q12H Fritzi Mandes, MD   500 mg at 06/12/16 0943  . docusate sodium (COLACE) capsule 100 mg  100 mg Oral BID PRN Vaughan Basta, MD      . ipratropium-albuterol (DUONEB) 0.5-2.5 (3) MG/3ML nebulizer solution 3 mL  3 mL Nebulization Q4H PRN Awilda Bill, NP      . pantoprazole (PROTONIX) EC tablet 40 mg  40 mg Oral BID Vaughan Basta, MD   40 mg at 06/12/16 6073     Discharge Medications: Please see discharge summary for a list of discharge medications.  Relevant Imaging Results:  Relevant Lab Results:   Additional Information  (SSN: [id number])  Makaya Juneau, Veronia Beets, LCSW

## 2016-06-12 NOTE — Discharge Summary (Addendum)
[facility] [place] NAME: Tristan [name]    MR#:  [id number]  DATE OF BIRTH:  [birth year]  DATE OF ADMISSION:  06/08/2016 ADMITTING PHYSICIAN: Vaughan Basta, MD  DATE OF DISCHARGE: 06/12/16  PRIMARY CARE PHYSICIAN: Patient, No Pcp Per    ADMISSION DIAGNOSIS:  Hypovolemic shock (Tristan [name]) [R57.1] Elevated liver enzymes [R74.8] Shock liver [K72.00] Non-ST elevation (NSTEMI) myocardial infarction (Tristan [name]) [I21.4] Anemia, unspecified type [D64.9]  DISCHARGE DIAGNOSIS:  Symptomatic severe anemia (need GI w/u as out pt) Acute renal failure appears pre-renal (baseline creat unknown) Acute NQWMI ---medical management (limited meds due to renal and liver issues) Shock liver---improving Severe AS (on echo) Severe cardiomyopathy with EF 25% now has LIFEVEST (per Tristan [name]) SECONDARY DIAGNOSIS:   Past Medical History:  Diagnosis Date  . Hypertension     HOSPITAL COURSE:   [name] a 71 y.o.malewith a known history of Hypertension- came to emergency room for complaint of 3-4 days weakness and shortness of breath. Not giving very clear history but denies any vomiting or blood in the stool. Denies any chest pain. In ER noted to have severe anemia and stool guaiac was negative, also noted to have acute renal failure and elevated troponin, elevated liver function enzymes, elevated lactic acid and have hypothermia  * Symptomatic anemia -Stool guaiac is negative as per ER physician. -Microcytic anemia, likely iron deficiency. -There is also elevated LDH and bilirubin level along with elevation and hepatic enzymes and renal failure. -5.8--- 2 unit blood transfusion--- 7.6--- 7.7 -Seen by GI. Patient does need luminal evaluation however given his acute MI and renal failure not a candidate at present. Monitor hemoglobin and transfuse as needed. -pt will follow GI as out pt  * Acute renal failure appears suspected pre-renal azotemia.  (baeline creat?) -recievedIV fluids, continue monitoring, avoid nephrotoxic agents -nephrology consult appreciated -creat trending down 1.9 (2.44)  * Elevated liver enzymes and bilirubin appears secondary to shock liver -Ultrasound gallbladder negative for acute findings. Hepatic steatosis No active bleed as per ER physician checked guaiac. -Continue to monitor LFTs trending down -unable to start statins since LFT's still not at baseline. Will defer to Cardiology to do it as out pt on f/u  * Acute Non-ST elevation MI with severe cardiomyopathy EF of 25% and severe AS noted on echo - seen by Cardiology Tristan [name]  -received  nitroglycerin IV drip---weaned off-not on asa due to suspected GI bleed and anemia -coreg 6.25 mg bid -Due to renal failure and severe anemia no catheterization at this point. -unable to start statins since LFT's still not at baseline. Will defer to Cardiology to do it as out pt on f/u -LIFE VET ordered by Tristan [name] (I was not aware of it till the day of discharge!!)  * Hypothermia resolved  * Elevated lactic acid Possibly due to renal failure and severe anemia, continue monitoring with IV fluids.  *mild UTI  Po keflex  * PT recommends SNF---d/c to rehab today  CONSULTS OBTAINED:  Treatment Team:  Tristan David, MD Tristan Lame, MD Tristan Dana, MD  DRUG ALLERGIES:  No Known Allergies  DISCHARGE MEDICATIONS:   Current Discharge Medication List    START taking these medications   Details  allopurinol (ZYLOPRIM) 100 MG tablet Take 1 tablet (100 mg total) by mouth daily. Qty: 30 tablet, Refills: 1    aspirin EC 81 MG tablet Take 1 tablet (81 mg total) by mouth daily. Qty: 30 tablet, Refills: 2  carvedilol (COREG) 6.25 MG tablet Take 1 tablet (6.25 mg total) by mouth 2 (two) times daily with a meal. Qty: 60 tablet, Refills: 0    cephALEXin (KEFLEX) 500 MG capsule Take 1 capsule (500 mg total) by mouth every 12 (twelve)  hours. Qty: 10 capsule, Refills: 0    pantoprazole (PROTONIX) 40 MG tablet Take 1 tablet (40 mg total) by mouth daily. Qty: 30 tablet, Refills: 1        If you experience worsening of your admission symptoms, develop shortness of breath, life threatening emergency, suicidal or homicidal thoughts you must seek medical attention immediately by calling 911 or calling your MD immediately  if symptoms less severe.  You Must read complete instructions/literature along with all the possible adverse reactions/side effects for all the Medicines you take and that have been prescribed to you. Take any new Medicines after you have completely understood and accept all the possible adverse reactions/side effects.   Please note  You were cared for by a hospitalist during your hospital stay. If you have any questions about your discharge medications or the care you received while you were in the hospital after you are discharged, you can call the unit and asked to speak with the hospitalist on call if the hospitalist that took care of you is not available. Once you are discharged, your primary care physician will handle any further medical issues. Please note that NO REFILLS for any discharge medications will be authorized once you are discharged, as it is imperative that you return to your primary care physician (or establish a relationship with a primary care physician if you do not have one) for your aftercare needs so that they can reassess your need for medications and monitor your lab values. Today   SUBJECTIVE   Doing well  VITAL SIGNS:  Blood pressure (!) 99/58, pulse 77, temperature 98.6 F (37 C), temperature source Oral, resp. rate 15, height 5\' 11"  (1.803 m), weight 77.2 kg (170 lb 3.1 oz), SpO2 98 %.  I/O:   Intake/Output Summary (Last 24 hours) at 06/12/16 1116 Last data filed at 06/12/16 1031  Gross per 24 hour  Intake             1120 ml  Output             2350 ml  Net             -1230 ml    PHYSICAL EXAMINATION:  GENERAL:  71 y.o.-year-old patient lying in the bed with no acute distress.  EYES: Pupils equal, round, reactive to light and accommodation. No scleral icterus. Extraocular muscles intact.  HEENT: Head atraumatic, normocephalic. Oropharynx and nasopharynx clear.  NECK:  Supple, no jugular venous distention. No thyroid enlargement, no tenderness.  LUNGS: Normal breath sounds bilaterally, no wheezing, rales,rhonchi or crepitation. No use of accessory muscles of respiration.  CARDIOVASCULAR: S1, S2 normal. No murmurs, rubs, or gallops.  ABDOMEN: Soft, non-tender, non-distended. Bowel sounds present. No organomegaly or mass.  EXTREMITIES: No pedal edema, cyanosis, or clubbing.  NEUROLOGIC: Cranial nerves II through XII are intact. Muscle strength 5/5 in all extremities. Sensation intact. Gait not checked.  PSYCHIATRIC: The patient is alert and oriented x 3.  SKIN: No obvious rash, lesion, or ulcer.   DATA REVIEW:   CBC   Recent Labs Lab 06/10/16 0416  WBC 15.2*  HGB 7.7*  HCT 24.3*  PLT 169    Chemistries   Recent Labs Lab 06/11/16 0756  NA 131*  K 4.1  CL 95*  CO2 27  GLUCOSE 169*  BUN 57*  CREATININE 1.86*  CALCIUM 8.3*  AST 110*  ALT 387*  ALKPHOS 65  BILITOT 3.0*    Microbiology Results   Recent Results (from the past 240 hour(s))  MRSA PCR Screening     Status: None   Collection Time: 06/08/16  2:33 PM  Result Value Ref Range Status   MRSA by PCR NEGATIVE NEGATIVE Final    Comment:        The GeneXpert MRSA Assay (FDA approved for NASAL specimens only), is one component of a comprehensive MRSA colonization surveillance program. It is not intended to diagnose MRSA infection nor to guide or monitor treatment for MRSA infections.     RADIOLOGY:  No results found.   Management plans discussed with the patient, family and they are in agreement.  CODE STATUS:     Code Status Orders        Start     Ordered    06/08/16 1433  Limited resuscitation (code)  Continuous    Question Answer Comment  In the event of cardiac or respiratory ARREST: Initiate Code Blue, Call Rapid Response Yes   In the event of cardiac or respiratory ARREST: Perform CPR Yes   In the event of cardiac or respiratory ARREST: Perform Intubation/Mechanical Ventilation No   In the event of cardiac or respiratory ARREST: Use NIPPV/BiPAp only if indicated Yes   In the event of cardiac or respiratory ARREST: Administer ACLS medications if indicated Yes   In the event of cardiac or respiratory ARREST: Perform Defibrillation or Cardioversion if indicated Yes      06/08/16 1432    Code Status History    Date Active Date Inactive Code Status Order ID Comments User Context   This patient has a current code status but no historical code status.      TOTAL TIME TAKING CARE OF THIS PATIENT: 40 minutes.    Liam Cammarata M.D on 06/12/2016 at 11:16 AM  Between 7am to 6pm - Pager - [phone] After 6pm go to [email or web address] - password [email or web address] [place] [facility]  Office  [phone]  CC: Primary care physician; Patient, No Pcp Per

## 2016-06-12 NOTE — Progress Notes (Signed)
[facility]  ROUNDING NOTE   Subjective:   No new creatinine. Son at bedside.   Patient eating well. Has no complaints today.   Objective:  Vital signs in last 24 hours:  Temp:  [98.6 F (37 C)-98.7 F (37.1 C)] 98.6 F (37 C) (05/28 0508) Pulse Rate:  [77-82] 77 (05/28 0508) Resp:  [15-16] 15 (05/28 0508) BP: (94-102)/(58-71) 99/58 (05/28 0508) SpO2:  [96 %-100 %] 98 % (05/28 0508)  Weight change:  Filed Weights   06/08/16 1006 06/08/16 1431  Weight: 74.9 kg (165 lb 2 oz) 77.2 kg (170 lb 3.1 oz)    Intake/Output: I/O last 3 completed shifts: In: 72 [P.O.:720; IV Piggyback:60] Out: 3382 [Urine:3225]   Intake/Output this shift:  Total I/O In: 760 [P.O.:760] Out: -   Physical Exam: General: NAD, laying in bed  Head: Normocephalic, atraumatic. Moist oral mucosal membranes  Eyes: Anicteric, PERRL  Neck: Supple, trachea midline  Lungs:  Wheezing bilaterally  Heart: Regular rate and rhythm  Abdomen:  Soft, nontender,   Extremities: no peripheral edema.  Neurologic: Nonfocal, moving all four extremities  Skin: Right shin wound - clean and dry dressings       Basic Metabolic Panel:  Recent Labs Lab 06/08/16 1018 06/09/16 0206 06/10/16 0416 06/11/16 0756  NA 135 134* 134* 131*  K 5.1 4.8 4.4 4.1  CL 104 106 98* 95*  CO2 7* 22 28 27   GLUCOSE 184* 184* 201* 169*  BUN 43* 46* 56* 57*  CREATININE 2.80* 2.44* 2.14* 1.86*  CALCIUM 9.0 8.5* 8.3* 8.3*  PHOS  --   --   --  4.5    Liver Function Tests:  Recent Labs Lab 06/08/16 1018 06/09/16 0206 06/10/16 0416 06/11/16 0756  AST 654* 1,280* 276* 110*  ALT 472* 773* 535* 387*  ALKPHOS 67 60 62 65  BILITOT 2.5* 5.6* 4.3* 3.0*  PROT 7.8 6.9 6.5 6.0*  ALBUMIN 3.8 3.4* 3.1* 2.9*  3.0*    Recent Labs Lab 06/08/16 1018  LIPASE 31   No results for input(s): AMMONIA in the last 168 hours.  CBC:  Recent Labs Lab 06/08/16 1018 06/08/16 1501 06/08/16 2058 06/09/16 0206 06/09/16 0751  06/10/16 0416  WBC 14.8*  --   --  15.7*  --  15.2*  NEUTROABS 12.6*  --   --   --   --  13.9*  HGB 3.7* 5.8* 7.6* 7.7* 7.6* 7.7*  HCT 15.3* 21.0* 24.9* 24.2* 24.5* 24.3*  MCV 61.6*  --   --  67.5*  --  66.7*  PLT 425  --   --  214  --  169    Cardiac Enzymes:  Recent Labs Lab 06/08/16 1018  TROPONINI 13.32*    BNP: Invalid input(s): POCBNP  CBG:  Recent Labs Lab 06/08/16 1429  GLUCAP 102*    Microbiology: Results for orders placed or performed during the hospital encounter of 06/08/16  MRSA PCR Screening     Status: None   Collection Time: 06/08/16  2:33 PM  Result Value Ref Range Status   MRSA by PCR NEGATIVE NEGATIVE Final    Comment:        The GeneXpert MRSA Assay (FDA approved for NASAL specimens only), is one component of a comprehensive MRSA colonization surveillance program. It is not intended to diagnose MRSA infection nor to guide or monitor treatment for MRSA infections.     Coagulation Studies: No results for input(s): LABPROT, INR in the last 72 hours.  Urinalysis:  No results for input(s): COLORURINE, LABSPEC, [place], GLUCOSEU, HGBUR, BILIRUBINUR, KETONESUR, PROTEINUR, UROBILINOGEN, NITRITE, LEUKOCYTESUR in the last 72 hours.  Invalid input(s): APPERANCEUR    Imaging: No results found.   Medications:    . allopurinol  100 mg Oral Daily  . carvedilol  6.25 mg Oral BID WC  . cephALEXin  500 mg Oral Q12H  . pantoprazole  40 mg Oral BID   docusate sodium, ipratropium-albuterol  Assessment/ Plan:  [name] is a 71 y.o. black male hypertension, peripheral vascular disease, who was admitted to [facility] on 06/08/2016   1. Acute Renal Failure with metabolic acidosis, proteinuria, hematuria and hyponatremia: secondary to prerenal azotemia from severe anemia Unclear baseline creatinine  - Creatinine improving. Encourage PO intake. Off IV fluids.   2. Hypertension: hypotensive. Off NTG gtt. With systolic congestive heart  failure, EF 25%.  - carvedilol was held  3. Anemia: microcytic. Status post 4 units PRBC 5/24. Given IV iron No signs of active hemolysis.  Hemoglobin 7.7 - Appreciate GI and Hematology input.   4. Liver injury: elevated transaminases. Liver enzymes trending down.   5. Gout: no more complaints.   - allopurinol.   Hospital Follow up with Nephrology.    LOS: 4 Pal [place] 5/28/201812:09 PM

## 2016-06-12 NOTE — Care Management (Addendum)
RNCM received call from Dr. Fritzi Mandes stating that patient's cards physician has not documented need for Lifevest. I cannot find any notes per cards either. Ailene Ravel with [facility] called this RNCM from Dr. Laurelyn Sickle office Friday stating that cardiologist would like for patient to discharge with [facility] lifevest which I started on Friday. There was no communication with hospitalist from cardiologist or this RNCM per Dr. Fritzi Mandes. I have left message with the above information for [facility] with [facility] for clarification. Meredith's contact number was also shared with Dr. Fritzi Mandes. Ailene Ravel called this RNCM back- they are ready to fit patient as she has talked to [facility] covering unit patient is now on. I explained that per Dr. Fritzi Mandes that Dr. Posey Pronto said "she had talked to cardiologist and he knew nothing about this Lifevest need and had not ordered it". Per Ailene Ravel with [facility] she "assured me that Dr Humphrey Rolls did give her the consult for Lifevest". Ailene Ravel will continue this process with [facility].

## 2016-06-12 NOTE — Progress Notes (Signed)
Called report to facility nurse at this time. Will now call for non-emergent EMS transfer over there. Wenda Low [facility]

## 2016-06-12 NOTE — Clinical Social Work Note (Addendum)
Clinical Social Work Assessment  Patient Details  Name: Deadrian Toya MRN: [id number] Date of Birth: 12-18-1945  Date of referral:  06/12/16               Reason for consult:  Facility Placement                Permission sought to share information with:  Chartered certified accountant granted to share information::  Yes, Verbal Permission Granted  Name::      [place]::   [place]   Relationship::     Contact Information:     Housing/Transportation Living arrangements for the past 2 months:  [place] of Information:  Patient, Adult Children Patient Interpreter Needed:  None Criminal Activity/Legal Involvement Pertinent to Current Situation/Hospitalization:  No - Comment as needed Significant Relationships:  Adult Children, Friend Lives with:  Self Do you feel safe going back to the place where you live?  Yes Need for family participation in patient care:  Yes (Comment)  Care giving concerns:  Patient lives alone in [place].    Social Worker assessment / plan:  Holiday representative (CSW) reviewed chart and noted that PT is recommending SNF. CSW met with patient and his son [name] and 2 friends were at bedside. Patient was alert and oriented X4 and was laying in the bed. CSW introduced self and explained role of CSW department. Patient reported that he lives alone in [place]. CSW explained SNF process. Patient is agreeable to SNF search in [facility]. FL2 complete and faxed out. CSW presented bed offers and patient chose [facility].   Patient is medically stable for D/C to [facility] today. Per [facility] liaison patient can come today to room 608. RN will call report to RN Yaakov Guthrie at [phone] and arrange EMS for transport. CSW sent D/C orders to [facility] via [facility]. Patient is aware of above. Patient's son [name] is at bedside and aware of D/C today. Please reconsult if future social work needs arise. CSW signing off.      Per [facility] liaison patient can come to [facility] with a life vest if it is fitted at the hospital and he comes with it. Per RN case manager patient will get fitted today and can D/C with the life vest.   Employment status:  Retired Forensic scientist:  Medicare PT Recommendations:  [place] / Referral to community resources:  [place]  Patient/Family's Response to care:  Patient and his son [name] are agreeable for patient to D/C to [facility] today.   Patient/Family's Understanding of and Emotional Response to Diagnosis, Current Treatment, and Prognosis:  Patient and his son were very pleasant and thanked CSW for assistance.   Emotional Assessment Appearance:  Appears stated age Attitude/Demeanor/Rapport:    Affect (typically observed):  Accepting, Adaptable, Pleasant Orientation:  Oriented to Self, Oriented to Place, Oriented to  Time, Oriented to Situation Alcohol / Substance use:  Not Applicable Psych involvement (Current and /or in the community):  No (Comment)  Discharge Needs  Concerns to be addressed:  Discharge Planning Concerns Readmission within the last 30 days:  No Current discharge risk:  None Barriers to Discharge:  No Barriers Identified   Bird Tailor, Veronia Beets, LCSW 06/12/2016, 11:45 AM

## 2016-06-12 NOTE — Discharge Instructions (Signed)
Shortness of Breath, Adult °Shortness of breath is when a person has trouble breathing enough air, or when a person feels like she or he is having trouble breathing in enough air. Shortness of breath could be a sign of medical problem. °Follow these instructions at home: °Pay attention to any changes in your symptoms. Take these actions to help with your condition: °· Do not smoke. Smoking is a common cause of shortness of breath. If you smoke and you need help quitting, ask your health care provider. °· Avoid things that can irritate your airways, such as: °¨ Mold. °¨ Dust. °¨ Air pollution. °¨ Chemical fumes. °¨ Things that can cause allergy symptoms (allergens), if you have allergies. °· Keep your living space clean and free of mold and dust. °· Rest as needed. Slowly return to your usual activities. °· Take over-the-counter and prescription medicines, including oxygen and inhaled medicines, only as told by your health care provider. °· Keep all follow-up visits as told by your health care provider. This is important. °Contact a health care provider if: °· Your condition does not improve as soon as expected. °· You have a hard time doing your normal activities, even after you rest. °· You have new symptoms. °Get help right away if: °· Your shortness of breath gets worse. °· You have shortness of breath when you are resting. °· You feel light-headed or you faint. °· You have a cough that is not controlled with medicines. °· You cough up blood. °· You have pain with breathing. °· You have pain in your chest, arms, shoulders, or abdomen. °· You have a fever. °· You cannot walk up stairs or exercise the way that you normally do. °This information is not intended to replace advice given to you by your health care provider. Make sure you discuss any questions you have with your health care provider. °Document Released: 09/27/2000 Document Revised: 07/24/2015 Document Reviewed: 06/10/2015 °Elsevier Interactive Patient  Education © 2017 [employer] Inc. ° °

## 2016-06-12 NOTE — Care Management Important Message (Signed)
Important Message  Patient Details  Name: Tristan [name] MRN: [id number] Date of Birth: 01/24/1945   Medicare Important Message Given:  Yes Signed IM notice given    Katrina Stack, RN 06/12/2016, 1:17 PM

## 2016-06-13 LAB — PROTEIN ELECTRO, RANDOM URINE
ALPHA-1-GLOBULIN, U: 1.4 %
ALPHA-2-GLOBULIN, U: 6.7 %
Albumin ELP, Urine: 49.1 %
BETA GLOBULIN, U: 22.6 %
Gamma Globulin, U: 20.2 %
TOTAL PROTEIN, URINE-UPE24: 33.1 mg/dL

## 2016-07-20 ENCOUNTER — Encounter: Payer: Self-pay | Admitting: Gastroenterology

## 2016-07-20 ENCOUNTER — Ambulatory Visit: Payer: Medicare Other | Admitting: Gastroenterology

## 2016-09-01 ENCOUNTER — Ambulatory Visit: Payer: Medicare Other | Admitting: Physician Assistant

## 2016-11-12 ENCOUNTER — Emergency Department: Payer: Medicare Other

## 2016-11-12 ENCOUNTER — Inpatient Hospital Stay
Admission: EM | Admit: 2016-11-12 | Discharge: 2016-11-25 | DRG: 854 | Disposition: A | Discharge status: Skilled Nursing Facility | Payer: Medicare Other | Attending: Internal Medicine | Admitting: Internal Medicine

## 2016-11-12 DIAGNOSIS — R000 Tachycardia, unspecified: Secondary | ICD-10-CM | POA: Diagnosis present

## 2016-11-12 DIAGNOSIS — K2930 Chronic superficial gastritis without bleeding: Secondary | ICD-10-CM | POA: Diagnosis present

## 2016-11-12 DIAGNOSIS — Z8249 Family history of ischemic heart disease and other diseases of the circulatory system: Secondary | ICD-10-CM | POA: Diagnosis not present

## 2016-11-12 DIAGNOSIS — Z825 Family history of asthma and other chronic lower respiratory diseases: Secondary | ICD-10-CM | POA: Diagnosis not present

## 2016-11-12 DIAGNOSIS — I959 Hypotension, unspecified: Secondary | ICD-10-CM | POA: Diagnosis present

## 2016-11-12 DIAGNOSIS — I350 Nonrheumatic aortic (valve) stenosis: Secondary | ICD-10-CM | POA: Diagnosis present

## 2016-11-12 DIAGNOSIS — D75839 Thrombocytosis, unspecified: Secondary | ICD-10-CM

## 2016-11-12 DIAGNOSIS — L97509 Non-pressure chronic ulcer of other part of unspecified foot with unspecified severity: Secondary | ICD-10-CM

## 2016-11-12 DIAGNOSIS — Z7982 Long term (current) use of aspirin: Secondary | ICD-10-CM

## 2016-11-12 DIAGNOSIS — D649 Anemia, unspecified: Secondary | ICD-10-CM | POA: Diagnosis not present

## 2016-11-12 DIAGNOSIS — D473 Essential (hemorrhagic) thrombocythemia: Secondary | ICD-10-CM | POA: Diagnosis present

## 2016-11-12 DIAGNOSIS — R7989 Other specified abnormal findings of blood chemistry: Secondary | ICD-10-CM | POA: Diagnosis not present

## 2016-11-12 DIAGNOSIS — K5730 Diverticulosis of large intestine without perforation or abscess without bleeding: Secondary | ICD-10-CM | POA: Diagnosis present

## 2016-11-12 DIAGNOSIS — A419 Sepsis, unspecified organism: Secondary | ICD-10-CM | POA: Diagnosis present

## 2016-11-12 DIAGNOSIS — I70262 Atherosclerosis of native arteries of extremities with gangrene, left leg: Secondary | ICD-10-CM | POA: Diagnosis not present

## 2016-11-12 DIAGNOSIS — I252 Old myocardial infarction: Secondary | ICD-10-CM | POA: Diagnosis not present

## 2016-11-12 DIAGNOSIS — I10 Essential (primary) hypertension: Secondary | ICD-10-CM | POA: Diagnosis not present

## 2016-11-12 DIAGNOSIS — F5089 Other specified eating disorder: Secondary | ICD-10-CM | POA: Diagnosis not present

## 2016-11-12 DIAGNOSIS — M109 Gout, unspecified: Secondary | ICD-10-CM | POA: Diagnosis present

## 2016-11-12 DIAGNOSIS — R5383 Other fatigue: Secondary | ICD-10-CM | POA: Diagnosis not present

## 2016-11-12 DIAGNOSIS — D509 Iron deficiency anemia, unspecified: Secondary | ICD-10-CM | POA: Diagnosis not present

## 2016-11-12 DIAGNOSIS — M79604 Pain in right leg: Secondary | ICD-10-CM | POA: Diagnosis not present

## 2016-11-12 DIAGNOSIS — I429 Cardiomyopathy, unspecified: Secondary | ICD-10-CM | POA: Diagnosis present

## 2016-11-12 DIAGNOSIS — Z792 Long term (current) use of antibiotics: Secondary | ICD-10-CM | POA: Diagnosis not present

## 2016-11-12 DIAGNOSIS — L03115 Cellulitis of right lower limb: Secondary | ICD-10-CM | POA: Diagnosis present

## 2016-11-12 DIAGNOSIS — I70261 Atherosclerosis of native arteries of extremities with gangrene, right leg: Secondary | ICD-10-CM | POA: Diagnosis present

## 2016-11-12 DIAGNOSIS — Z79899 Other long term (current) drug therapy: Secondary | ICD-10-CM

## 2016-11-12 DIAGNOSIS — L97518 Non-pressure chronic ulcer of other part of right foot with other specified severity: Secondary | ICD-10-CM | POA: Diagnosis present

## 2016-11-12 DIAGNOSIS — F1210 Cannabis abuse, uncomplicated: Secondary | ICD-10-CM | POA: Diagnosis not present

## 2016-11-12 DIAGNOSIS — N183 Chronic kidney disease, stage 3 (moderate): Secondary | ICD-10-CM | POA: Diagnosis present

## 2016-11-12 DIAGNOSIS — I129 Hypertensive chronic kidney disease with stage 1 through stage 4 chronic kidney disease, or unspecified chronic kidney disease: Secondary | ICD-10-CM | POA: Diagnosis present

## 2016-11-12 DIAGNOSIS — K219 Gastro-esophageal reflux disease without esophagitis: Secondary | ICD-10-CM | POA: Diagnosis present

## 2016-11-12 DIAGNOSIS — E871 Hypo-osmolality and hyponatremia: Secondary | ICD-10-CM | POA: Diagnosis present

## 2016-11-12 DIAGNOSIS — N179 Acute kidney failure, unspecified: Secondary | ICD-10-CM | POA: Diagnosis present

## 2016-11-12 DIAGNOSIS — D500 Iron deficiency anemia secondary to blood loss (chronic): Secondary | ICD-10-CM | POA: Diagnosis present

## 2016-11-12 DIAGNOSIS — M869 Osteomyelitis, unspecified: Secondary | ICD-10-CM

## 2016-11-12 DIAGNOSIS — Z87891 Personal history of nicotine dependence: Secondary | ICD-10-CM | POA: Diagnosis not present

## 2016-11-12 DIAGNOSIS — R770 Abnormality of albumin: Secondary | ICD-10-CM | POA: Diagnosis not present

## 2016-11-12 DIAGNOSIS — L0390 Cellulitis, unspecified: Secondary | ICD-10-CM

## 2016-11-12 DIAGNOSIS — I96 Gangrene, not elsewhere classified: Secondary | ICD-10-CM | POA: Diagnosis not present

## 2016-11-12 LAB — CBC WITH DIFFERENTIAL/PLATELET
BASOS ABS: 0.1 10*3/uL (ref 0–0.1)
Basophils Relative: 1 %
EOS PCT: 0 %
Eosinophils Absolute: 0 10*3/uL (ref 0–0.7)
HCT: 18.5 % — ABNORMAL LOW (ref 40.0–52.0)
Hemoglobin: 5.3 g/dL — ABNORMAL LOW (ref 13.0–18.0)
LYMPHS ABS: 0.4 10*3/uL — AB (ref 1.0–3.6)
Lymphocytes Relative: 4 %
MCH: 16.5 pg — AB (ref 26.0–34.0)
MCHC: 28.4 g/dL — ABNORMAL LOW (ref 32.0–36.0)
MCV: 57.9 fL — ABNORMAL LOW (ref 80.0–100.0)
MONO ABS: 1.2 10*3/uL — AB (ref 0.2–1.0)
Monocytes Relative: 11 %
NEUTROS PCT: 84 %
Neutro Abs: 9.2 10*3/uL — ABNORMAL HIGH (ref 1.4–6.5)
Platelets: 798 10*3/uL — ABNORMAL HIGH (ref 150–440)
RBC: 3.19 MIL/uL — AB (ref 4.40–5.90)
RDW: 21.3 % — AB (ref 11.5–14.5)
WBC: 10.8 10*3/uL — AB (ref 3.8–10.6)

## 2016-11-12 LAB — COMPREHENSIVE METABOLIC PANEL
ALT: 14 U/L — AB (ref 17–63)
AST: 24 U/L (ref 15–41)
Albumin: 2.9 g/dL — ABNORMAL LOW (ref 3.5–5.0)
Alkaline Phosphatase: 126 U/L (ref 38–126)
Anion gap: 11 (ref 5–15)
BUN: 31 mg/dL — ABNORMAL HIGH (ref 6–20)
CO2: 26 mmol/L (ref 22–32)
CREATININE: 1.84 mg/dL — AB (ref 0.61–1.24)
Calcium: 8.7 mg/dL — ABNORMAL LOW (ref 8.9–10.3)
Chloride: 92 mmol/L — ABNORMAL LOW (ref 101–111)
GFR calc non Af Amer: 35 mL/min — ABNORMAL LOW (ref >60)
GFR, EST AFRICAN AMERICAN: 41 mL/min — AB (ref >60)
Glucose, Bld: 145 mg/dL — ABNORMAL HIGH (ref 65–99)
POTASSIUM: 4.1 mmol/L (ref 3.5–5.1)
SODIUM: 129 mmol/L — AB (ref 135–145)
Total Bilirubin: 1.9 mg/dL — ABNORMAL HIGH (ref 0.3–1.2)
Total Protein: 7.8 g/dL (ref 6.5–8.1)

## 2016-11-12 LAB — FERRITIN: Ferritin: 30 ng/mL (ref 24–336)

## 2016-11-12 LAB — IRON AND TIBC
IRON: 7 ug/dL — AB (ref 45–182)
SATURATION RATIOS: 3 % — AB (ref 17.9–39.5)
TIBC: 259 ug/dL (ref 250–450)
UIBC: 252 ug/dL

## 2016-11-12 LAB — SEDIMENTATION RATE: SED RATE: 107 mm/h — AB (ref 0–20)

## 2016-11-12 LAB — PREPARE RBC (CROSSMATCH)

## 2016-11-12 MED ORDER — ONDANSETRON HCL 4 MG PO TABS: 4.0000 mg | ORAL_TABLET | Freq: Four times a day (QID) | ORAL | Status: DC | PRN

## 2016-11-12 MED ORDER — PIPERACILLIN-TAZOBACTAM 3.375 G IVPB
3.3750 g | Freq: Three times a day (TID) | INTRAVENOUS | Status: DC
  Administered 2016-11-12 – 2016-11-23 (×29): 3.375 g via INTRAVENOUS
  Filled 2016-11-12 (×30): qty 50

## 2016-11-12 MED ORDER — SODIUM CHLORIDE 0.9 % IV SOLN
INTRAVENOUS | Status: DC
  Administered 2016-11-13: 02:00:00 via INTRAVENOUS

## 2016-11-12 MED ORDER — PIPERACILLIN-TAZOBACTAM 3.375 G IVPB 30 MIN
3.3750 g | Freq: Once | INTRAVENOUS | Status: AC
  Administered 2016-11-12: 3.375 g via INTRAVENOUS
  Filled 2016-11-12 (×2): qty 50

## 2016-11-12 MED ORDER — BISACODYL 10 MG RE SUPP
10.0000 mg | Freq: Every day | RECTAL | Status: DC | PRN
  Administered 2016-11-25: 10 mg via RECTAL
  Filled 2016-11-12: qty 1

## 2016-11-12 MED ORDER — ACETAMINOPHEN 650 MG RE SUPP: 650.0000 mg | Freq: Four times a day (QID) | RECTAL | Status: DC | PRN

## 2016-11-12 MED ORDER — SODIUM CHLORIDE 0.9 % IV SOLN: 10.0000 mL/h | Freq: Once | INTRAVENOUS | Status: DC

## 2016-11-12 MED ORDER — PANTOPRAZOLE SODIUM 40 MG IV SOLR
40.0000 mg | Freq: Two times a day (BID) | INTRAVENOUS | Status: DC
  Administered 2016-11-12 – 2016-11-13 (×2): 40 mg via INTRAVENOUS
  Filled 2016-11-12 (×3): qty 40

## 2016-11-12 MED ORDER — SODIUM CHLORIDE 0.9 % IV BOLUS (SEPSIS)
1000.0000 mL | Freq: Once | INTRAVENOUS | Status: AC
  Administered 2016-11-12: 1000 mL via INTRAVENOUS

## 2016-11-12 MED ORDER — HEPARIN SODIUM (PORCINE) 5000 UNIT/ML IJ SOLN
5000.0000 [IU] | Freq: Three times a day (TID) | INTRAMUSCULAR | Status: DC
  Administered 2016-11-12 – 2016-11-13 (×4): 5000 [IU] via SUBCUTANEOUS
  Filled 2016-11-12 (×4): qty 1

## 2016-11-12 MED ORDER — ONDANSETRON HCL 4 MG/2ML IJ SOLN: 4.0000 mg | Freq: Four times a day (QID) | INTRAMUSCULAR | Status: DC | PRN

## 2016-11-12 MED ORDER — VANCOMYCIN HCL IN DEXTROSE 1-5 GM/200ML-% IV SOLN
1000.0000 mg | Freq: Once | INTRAVENOUS | Status: AC
  Administered 2016-11-12: 1000 mg via INTRAVENOUS
  Filled 2016-11-12 (×2): qty 200

## 2016-11-12 MED ORDER — ALLOPURINOL 100 MG PO TABS
100.0000 mg | ORAL_TABLET | Freq: Every day | ORAL | Status: DC
  Administered 2016-11-13 – 2016-11-25 (×11): 100 mg via ORAL
  Filled 2016-11-12 (×13): qty 1

## 2016-11-12 MED ORDER — ASPIRIN EC 81 MG PO TBEC: 81.0000 mg | DELAYED_RELEASE_TABLET | Freq: Every day | ORAL | Status: DC

## 2016-11-12 MED ORDER — ACETAMINOPHEN 325 MG PO TABS
650.0000 mg | ORAL_TABLET | Freq: Four times a day (QID) | ORAL | Status: DC | PRN
  Administered 2016-11-12 – 2016-11-24 (×7): 650 mg via ORAL
  Filled 2016-11-12 (×7): qty 2

## 2016-11-12 MED ORDER — CARVEDILOL 3.125 MG PO TABS: 6.2500 mg | ORAL_TABLET | Freq: Two times a day (BID) | ORAL | Status: DC

## 2016-11-12 MED ORDER — VANCOMYCIN HCL 10 G IV SOLR
1250.0000 mg | INTRAVENOUS | Status: DC
  Administered 2016-11-13: 1250 mg via INTRAVENOUS
  Filled 2016-11-12: qty 1250

## 2016-11-12 MED ORDER — DOCUSATE SODIUM 100 MG PO CAPS
100.0000 mg | ORAL_CAPSULE | Freq: Two times a day (BID) | ORAL | Status: DC
  Administered 2016-11-12 – 2016-11-25 (×22): 100 mg via ORAL
  Filled 2016-11-12 (×23): qty 1

## 2016-11-12 NOTE — Progress Notes (Signed)
Pt. Ordered to get one more unit of blood. Temp is 100.8. Dr. Marcille Blanco notified and tylenol administered. Dr. Marcille Blanco wants blood held til temp has resolved.

## 2016-11-12 NOTE — ED Notes (Signed)
One silver-colored wristband and two bronze-colored bracelets with one bronze-colored ring removed from pt's L arm and placed in specimen cup. Cup placed in pt's belongings bag at bedside, on top of pt's home wheelchair.

## 2016-11-12 NOTE — ED Notes (Signed)
Spoke with MD, blood transfusion order is not emergent and give blood on floor.

## 2016-11-12 NOTE — Progress Notes (Signed)
Pharmacy Antibiotic Note  Tristan [name] is a 71 y.o. male admitted on 11/12/2016 with sepsis.  Pharmacy has been consulted for Vancomycin and Zosyn dosing.  Plan: Vancomycin 1250 mg IV every 24 hours.  Goal trough 15-20 mcg/mL. Zosyn 3.375g IV q8h (4 hour infusion). Will check a trough level prior to 5th dose.  Height: 5\' 11"  (180.3 cm) Weight: 174 lb (78.9 kg) IBW/kg (Calculated) : 75.3  Temp (24hrs), Avg:98.5 F (36.9 C), Min:97.6 F (36.4 C), Max:99.1 F (37.3 C)   Recent Labs Lab 11/12/16 1233  WBC 10.8*  CREATININE 1.84*    Estimated Creatinine Clearance: 39.8 mL/min (A) (by C-G formula based on SCr of 1.84 mg/dL (H)).    No Known Allergies  Antimicrobials this admission: Vancomycin 10/28 >>  Zosyn 10/28 >>    Thank you for allowing pharmacy to be a part of this patient's care.  Paulina Fusi, PharmD, BCPS 11/12/2016 7:05 PM

## 2016-11-12 NOTE — Consult Note (Signed)
ORTHOPAEDIC CONSULTATION  REQUESTING PHYSICIAN: Idelle Crouch, MD  Chief Complaint: Right foot pain  HPI: Tristan [name] is a 71 y.o. male who complains of right foot pain.  He states for the last 6-8 months he has had pain in this right foot and leg.  It progressively became worse recently.  He states that this point he is not able to ambulate.  States he has been living at home by himself.  Admitted from the ER secondary to cellulitis to the right leg.  Past Medical History:  Diagnosis Date  . Hypertension    History reviewed. No pertinent surgical history. Social History   Social History  . Marital status: Single    Spouse name: N/A  . Number of children: N/A  . Years of education: N/A   Social History Main Topics  . Smoking status: Former Smoker    Quit date: 06/09/1979  . Smokeless tobacco: Never Used  . Alcohol use No  . Drug use: Yes    Types: Marijuana  . Sexual activity: Not Asked   Other Topics Concern  . None   Social History Narrative  . None   Family History  Problem Relation Age of Onset  . Hypertension Brother    No Known Allergies Prior to Admission medications   Medication Sig Start Date End Date Taking? Authorizing Provider  allopurinol (ZYLOPRIM) 100 MG tablet Take 1 tablet (100 mg total) by mouth daily. 06/13/16  Yes Fritzi Mandes, MD  aspirin EC 81 MG tablet Take 1 tablet (81 mg total) by mouth daily. 06/12/16 06/12/17 Yes Fritzi Mandes, MD  carvedilol (COREG) 6.25 MG tablet Take 1 tablet (6.25 mg total) by mouth 2 (two) times daily with a meal. Patient taking differently: Take 25 mg by mouth 2 (two) times daily with a meal.  06/12/16  Yes Fritzi Mandes, MD  furosemide (LASIX) 20 MG tablet Take 20 mg by mouth daily.   Yes [provider]  losartan (COZAAR) 25 MG tablet Take 25 mg by mouth daily.   Yes [provider]  pantoprazole (PROTONIX) 40 MG tablet Take 1 tablet (40 mg total) by mouth daily. Patient taking differently:  Take 20-40 mg by mouth daily.  06/12/16  Yes Fritzi Mandes, MD  cephALEXin (KEFLEX) 500 MG capsule Take 1 capsule (500 mg total) by mouth every 12 (twelve) hours. Patient not taking: Reported on 11/12/2016 06/12/16   Fritzi Mandes, MD   Dg Tibia/fibula Right  Result Date: 11/12/2016 CLINICAL DATA:  Right leg cellulitis and necrotic soft tissue wound. EXAM: RIGHT TIBIA AND FIBULA - 2 VIEW COMPARISON:  None. FINDINGS: Diffuse soft tissue swelling is seen. No evidence of soft tissue gas. Stent is seen within the right popliteal artery. Peripheral vascular calcification noted. No evidence of osteolysis or periostitis. No evidence of fracture or other focal bone lesion. IMPRESSION: Diffuse soft tissue swelling. No radiographic evidence of osteomyelitis or other osseous abnormality. Electronically Signed   By: Earle Gell M.D.   On: 11/12/2016 16:40   Dg Foot Complete Right  Result Date: 11/12/2016 CLINICAL DATA:  Soft tissue ulcer necrosis along dorsal aspect of foot. EXAM: RIGHT FOOT COMPLETE - 3+ VIEW COMPARISON:  11/19/2013 FINDINGS: Soft tissue swelling seen along the dorsal aspect of the midfoot. No evidence of soft tissue gas. No evidence of osteolysis or periostitis. No other focal bone lesions identified. Generalized osteopenia again noted. 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 IMPRESSION: Dorsal soft tissue swelling. No radiographic evidence of osteomyelitis  or other acute osseous abnormality. 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 Electronically Signed   By: Earle Gell M.D.   On: 11/12/2016 14:58    Positive ROS: All other systems have been reviewed and were otherwise negative with the exception of those mentioned in the HPI and as above.  12 point ROS was performed.  Physical Exam: General: Alert and oriented.  No apparent distress.  Vascular:  Left foot:Dorsalis Pedis:  absent Posterior Tibial:  absent  Right foot: Dorsalis Pedis:   absent Posterior Tibial:  absent  Neuro:intact gross sensation to the lower extremities  Derm: Left foot is negative for acute dermatological issues  Right leg has a very large ischemic necrotic area of skin from the mid tibia on the anterior aspect of the leg extending across the ankle to the dorsal aspect of the midfoot to forefoot region.  No drainage.  Dry necrotic tissue is present.  Only opening of the wound is the most proximal aspect along the anterior tibia where some of the loose superficial necrotic skin is noted.  No purulent drainage.  Does not probe to bone.  Lymphangitic streak on the anterior mid leg coursing proximally along the anterior mid portion of the leg proximally.  Also right fifth toe is darkened and pregangrenous at this time.  Ortho/MS: Diffuse edema to the right leg.  Severe pain with any attempted range of motion of the ankle.  Pain with palpation of the lower leg.  No pain at the knee or proximal.  Assessment: Necrotic gangrenous skin right lower leg  Plan: This looks to be purely vascular in nature.  He does have a lymphangitic streak likely secondary to the area of necrotic skin on the anterior lower leg.  At this point I recommend vascular evaluation.  Patient is at high risk of needing amputation.  At this time would defer further treatment of the right lower leg to vascular surgery after their evaluation.    Elesa Hacker, DPM Cell [phone]   11/12/2016 8:31 PM

## 2016-11-12 NOTE — H&P (Signed)
History and Physical    Melissa Pulido VXB:[id number] DOB: 1945-03-15 DOA: 11/12/2016  Referring physician: Dr. Joni Fears PCP: Patient, No Pcp Per  Specialists: none  Chief Complaint: right foot pain and swelling  HPI: Tristan [name] is a 71 y.o. male has a past medical history significant for HTN and anemia who presents to ER with increasing pain and swelling of right foot with erythema and warmth. Also has noted some DOE and weakness. No fever. Denies CP or resting SOB. No N/V/D. In ER, pt noted to have right foot ulcer with severe cellulitis. Also noted to have hgb=5.3 with platelets>800. Stool is guaiac negative. He is now admitted.  Review of Systems: The patient denies anorexia, fever, weight loss,, vision loss, decreased hearing, hoarseness, chest pain, syncope,  balance deficits, hemoptysis, abdominal pain, melena, hematochezia, severe indigestion/heartburn, hematuria, incontinence, genital sores, muscle weakness, suspicious skin lesions, transient blindness, difficulty walking, depression, unusual weight change, abnormal bleeding, enlarged lymph nodes, angioedema, and breast masses.   Past Medical History:  Diagnosis Date  . Hypertension    History reviewed. No pertinent surgical history. Social History:  reports that he quit smoking about 37 years ago. He has never used smokeless tobacco. He reports that he uses drugs, including Marijuana. He reports that he does not drink alcohol.  No Known Allergies  Family History  Problem Relation Age of Onset  . Hypertension Brother     Prior to Admission medications   Medication Sig Start Date End Date Taking? Authorizing Provider  allopurinol (ZYLOPRIM) 100 MG tablet Take 1 tablet (100 mg total) by mouth daily. 06/13/16   Fritzi Mandes, MD  aspirin EC 81 MG tablet Take 1 tablet (81 mg total) by mouth daily. 06/12/16 06/12/17  Fritzi Mandes, MD  carvedilol (COREG) 6.25 MG tablet Take 1 tablet (6.25 mg total) by mouth 2 (two) times  daily with a meal. 06/12/16   Fritzi Mandes, MD  cephALEXin (KEFLEX) 500 MG capsule Take 1 capsule (500 mg total) by mouth every 12 (twelve) hours. 06/12/16   Fritzi Mandes, MD  pantoprazole (PROTONIX) 40 MG tablet Take 1 tablet (40 mg total) by mouth daily. 06/12/16   Fritzi Mandes, MD   Physical Exam: Vitals:   11/12/16 1224 11/12/16 1500 11/12/16 1535  BP: (!) 112/58 131/70 (!) 102/58  Pulse: (!) 110 (!) 102 (!) 110  Resp: 18 16 18   Temp: 97.6 F (36.4 C)    TempSrc: Oral    SpO2: 100% 100% 100%  Weight: 77.1 kg (170 lb)    Height: 5\' 11"  (1.803 m)       General:  No apparent distress, WDWN, [place]/AT  Eyes: PERRL, EOMI, no scleral icterus, conjunctiva pale  ENT: moist oropharynx without exudate, TM's benign, dentition poor  Neck: supple, no lymphadenopathy. No bruits or thyromegaly  Cardiovascular: rapid rate with regular rhythm without MRG; 2+ peripheral pulses, no JVD, 1+ peripheral edema  Respiratory: CTA biL, good air movement without wheezing, rhonchi or crackled. Respiratory effort normal  Abdomen: soft, non tender to palpation, positive bowel sounds, no guarding, no rebound  Skin: Right foot with ulcer and abrasions with warmth, erythema and purulence. 2+ pedal edema on right  Musculoskeletal: normal bulk and tone, no joint swelling  Psychiatric: normal mood and affect, A&OX3  Neurologic: CN 2-12 grossly intact, Motor strength 5/5 in all 4 groups with symmetric DTR's and non-focal sensory exam  Labs on Admission:  Basic Metabolic Panel:  Recent Labs Lab 11/12/16 1233  NA 129*  K  4.1  CL 92*  CO2 26  GLUCOSE 145*  BUN 31*  CREATININE 1.84*  CALCIUM 8.7*   Liver Function Tests:  Recent Labs Lab 11/12/16 1233  AST 24  ALT 14*  ALKPHOS 126  BILITOT 1.9*  PROT 7.8  ALBUMIN 2.9*   No results for input(s): LIPASE, AMYLASE in the last 168 hours. No results for input(s): AMMONIA in the last 168 hours. CBC:  Recent Labs Lab 11/12/16 1233  WBC 10.8*   NEUTROABS 9.2*  HGB 5.3*  HCT 18.5*  MCV 57.9*  PLT 798*   Cardiac Enzymes: No results for input(s): CKTOTAL, CKMB, CKMBINDEX, TROPONINI in the last 168 hours.  BNP (last 3 results)  Recent Labs  06/08/16 1505  BNP 3,078.0*    ProBNP (last 3 results) No results for input(s): PROBNP in the last 8760 hours.  CBG: No results for input(s): GLUCAP in the last 168 hours.  Radiological Exams on Admission: Dg Foot Complete Right  Result Date: 11/12/2016 CLINICAL DATA:  Soft tissue ulcer necrosis along dorsal aspect of foot. EXAM: RIGHT FOOT COMPLETE - 3+ VIEW COMPARISON:  11/19/2013 FINDINGS: Soft tissue swelling seen along the dorsal aspect of the midfoot. No evidence of soft tissue gas. No evidence of osteolysis or periostitis. No other focal bone lesions identified. Generalized osteopenia again noted. 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 IMPRESSION: Dorsal soft tissue swelling. No radiographic evidence of osteomyelitis or other acute osseous abnormality. 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 Electronically Signed   By: Earle Gell M.D.   On: 11/12/2016 14:58    EKG: Independently reviewed.  Assessment/Plan Principal Problem:   Cellulitis Active Problems:   Symptomatic anemia   Thrombocytosis (HCC)   Foot ulcer ([place])   Will admit to floor and transfuse 2u PRBC's. Send anemia labs and guaiac stools. Begin IV fluids and IV ABX. Consult GI, Hematology, and Podiatry. Repeat labs in AM  Diet: regular Fluids: NS@50  DVT Prophylaxis: SQ Heparin  Code Status: FULL  Family Communication: none  Disposition Plan: home  Time spent: 50 min

## 2016-11-12 NOTE — ED Triage Notes (Signed)
Pt here for right foot pain. Pt comes in with foot wrapped. Upon unwrapping foot, pt has ulcer and necrotic tissue to top of right foot/ankle area,  as well as redness and oozing coming from top of foot. Pt reports has been like this for months but pt finally feels ready to have it examined.

## 2016-11-12 NOTE — ED Notes (Signed)
[facility] at bedside

## 2016-11-12 NOTE — ED Provider Notes (Signed)
[facility] Emergency Department Provider Note  ____________________________________________  Time seen: Approximately 3:47 PM  I have reviewed the triage vital signs and the nursing notes.   HISTORY  Chief Complaint Foot Pain  Level 5 Caveat: Portions of the History and Physical are unable to be obtained due to patient being a poor historian   HPI Tristan [name] is a 71 y.o. male comes to the ED due to a wound on his right foot that has been nonhealing over several weeks to months. He complains of diffuse pain in the right ankle and foot. No wounds or traumatic injuries that he can recall. He thinks it happened because somebody essentially put a curse on him.positive chills. Right leg is becoming too painful to walk on.pain is alleviated by lying down and taking weight off the leg. Nonradiating. Moderate intensity, aching.  Prior to being limited by pain, he does note that he had been feeling very fatigued and short of breath whenever he walks around.He denies black or bloody stool or hematemesis. He does report a history of blood transfusions.     Past Medical History:  Diagnosis Date  . Hypertension      Patient Active Problem List   Diagnosis Date Noted  . Symptomatic anemia 06/08/2016  . Acute renal failure (ARF) ([place]) 06/08/2016  . Hypothermia 06/08/2016  . NSTEMI (non-ST elevated myocardial infarction) ([place]) 06/08/2016     History reviewed. No pertinent surgical history.   Prior to Admission medications   Medication Sig Start Date End Date Taking? Authorizing Provider  allopurinol (ZYLOPRIM) 100 MG tablet Take 1 tablet (100 mg total) by mouth daily. 06/13/16   Fritzi Mandes, MD  aspirin EC 81 MG tablet Take 1 tablet (81 mg total) by mouth daily. 06/12/16 06/12/17  Fritzi Mandes, MD  carvedilol (COREG) 6.25 MG tablet Take 1 tablet (6.25 mg total) by mouth 2 (two) times daily with a meal. 06/12/16   Fritzi Mandes, MD  cephALEXin (KEFLEX) 500 MG  capsule Take 1 capsule (500 mg total) by mouth every 12 (twelve) hours. 06/12/16   Fritzi Mandes, MD  pantoprazole (PROTONIX) 40 MG tablet Take 1 tablet (40 mg total) by mouth daily. 06/12/16   Fritzi Mandes, MD     Allergies Patient has no known allergies.   Family History  Problem Relation Age of Onset  . Hypertension Brother     Social History Social History  Substance Use Topics  . Smoking status: Former Smoker    Quit date: 06/09/1979  . Smokeless tobacco: Never Used  . Alcohol use No    Review of Systems  Constitutional:   No fever positive chills.  ENT:   No sore throat. No rhinorrhea. Cardiovascular:   No chest pain or syncope. Respiratory:   No dyspnea or cough. Gastrointestinal:   Negative for abdominal pain, vomiting and diarrhea.  Musculoskeletal:   positive right leg pain and swelling as above All other systems reviewed and are negative except as documented above in ROS and HPI.  ____________________________________________   PHYSICAL EXAM:  VITAL SIGNS: ED Triage Vitals  Enc Vitals Group     BP 11/12/16 1224 (!) 112/58     Pulse Rate 11/12/16 1224 (!) 110     Resp 11/12/16 1224 18     Temp 11/12/16 1224 97.6 F (36.4 C)     Temp Source 11/12/16 1224 Oral     SpO2 11/12/16 1224 100 %     Weight 11/12/16 1224 170 lb (77.1 kg)  Height 11/12/16 1224 5\' 11"  (1.803 m)     Head Circumference --      Peak Flow --      Pain Score 11/12/16 1535 0     Pain Loc --      Pain Edu? --      Excl. in [place]? --     Vital signs reviewed, nursing assessments reviewed.   Constitutional:   Alert and oriented. not in distress. Eyes:   No scleral icterus.  EOMI. No nystagmus. No conjunctival pallor. PERRL. ENT   Head:   Normocephalic and atraumatic.   Nose:   No congestion/rhinnorhea.    Mouth/Throat:   dry mucous membranes, no pharyngeal erythema. No peritonsillar mass.    Neck:   No meningismus. Full ROM. Hematological/Lymphatic/Immunilogical:   No  cervical lymphadenopathy. Cardiovascular:   tachycardia heart rate 110. Symmetric bilateral radial and DP pulses.  No murmurs.  Respiratory:   Normal respiratory effort without tachypnea/retractions. Breath sounds are clear and equal bilaterally. No wheezes/rales/rhonchi. Gastrointestinal:   Soft and nontender. Non distended. There is no CVA tenderness.  No rebound, rigidity, or guarding. Genitourinary:   deferred Musculoskeletal:   Normal range of motion in all extremities. No joint effusions.  swelling and tenderness of the right lower leg starting from the mid tibia down through the right foot. At the leading edge, there is a necrotic appearing ulceration through the skin just overlying the anterior tibia. This probes to bone. There is a necrotic superficial wound extending inferiorly from this ulcer across the anterior shin and dorsal foot. Intact distal and tendon function Neurologic:   Normal speech and language.  Motor grossly intact. No gross focal neurologic deficits are appreciated.  Skin:    Skin is warm, dry with large lesion on the right leg as above  ____________________________________________    LABS (pertinent positives/negatives) (all labs ordered are listed, but only abnormal results are displayed) Labs Reviewed  CBC WITH DIFFERENTIAL/PLATELET - Abnormal; Notable for the following:       Result Value   WBC 10.8 (*)    RBC 3.19 (*)    Hemoglobin 5.3 (*)    HCT 18.5 (*)    MCV 57.9 (*)    MCH 16.5 (*)    MCHC 28.4 (*)    RDW 21.3 (*)    Platelets 798 (*)    Neutro Abs 9.2 (*)    Lymphs Abs 0.4 (*)    Monocytes Absolute 1.2 (*)    All other components within normal limits  COMPREHENSIVE METABOLIC PANEL - Abnormal; Notable for the following:    Sodium 129 (*)    Chloride 92 (*)    Glucose, Bld 145 (*)    BUN 31 (*)    Creatinine, Ser 1.84 (*)    Calcium 8.7 (*)    Albumin 2.9 (*)    ALT 14 (*)    Total Bilirubin 1.9 (*)    GFR calc non Af Amer 35 (*)    GFR  calc Af Amer 41 (*)    All other components within normal limits  SEDIMENTATION RATE - Abnormal; Notable for the following:    Sed Rate 107 (*)    All other components within normal limits  TYPE AND SCREEN  TYPE AND SCREEN  PREPARE RBC (CROSSMATCH)   ____________________________________________   EKG    ____________________________________________    RADIOLOGY  Dg Foot Complete Right  Result Date: 11/12/2016 CLINICAL DATA:  Soft tissue ulcer necrosis along dorsal aspect of foot. EXAM:  RIGHT FOOT COMPLETE - 3+ VIEW COMPARISON:  11/19/2013 FINDINGS: Soft tissue swelling seen along the dorsal aspect of the midfoot. No evidence of soft tissue gas. No evidence of osteolysis or periostitis. No other focal bone lesions identified. Generalized osteopenia again noted. 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 IMPRESSION: Dorsal soft tissue swelling. No radiographic evidence of osteomyelitis or other acute osseous abnormality. 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 Electronically Signed   By: Earle Gell M.D.   On: 11/12/2016 14:58    ____________________________________________   PROCEDURES Procedures CRITICAL CARE Performed by: Joni Fears, Zanyiah Posten   Total critical care time: 35 minutes  Critical care time was exclusive of separately billable procedures and treating other patients.  Critical care was necessary to treat or prevent imminent or life-threatening deterioration.  Critical care was time spent personally by me on the following activities: development of treatment plan with patient and/or surrogate as well as nursing, discussions with consultants, evaluation of patient's response to treatment, examination of patient, obtaining history from patient or surrogate, ordering and performing treatments and interventions, ordering and review of laboratory studies, ordering and review of radiographic studies, pulse oximetry and re-evaluation of  patient's condition.  ____________________________________________   DIFFERENTIAL DIAGNOSIS  Symptomatic anemia, cellulitis, necrotizing fasciitis, osteomyelitis, pyoderma gangrenosum  CLINICAL IMPRESSION / ASSESSMENT AND PLAN / ED COURSE  Pertinent labs & imaging results that were available during my care of the patient were reviewed by me and considered in my medical decision making (see chart for details).   patient presents with tachycardia, marked thrombocytosis, right leg pain associated with a necrotic wound with an ulceration exposing the anterior tibia over the mid shaft. This is all concerning for cellulitis and likely osteomyelitis. I doubt necrotizing fasciitis due to the absence of crepitus and the slow progression that his symptoms have undertaken and lack of any proximal findings. The skin is not diffusely ulcerated, less likely pyoderma. I suspect the anemia is due to bone marrow suppression from chronic illness. Patient denies any symptoms to suggest GI bleeding, and on exam I don't find any evidence. Discussed need for blood transfusion with the patient, he agrees. I have ordered the transfusion. I have ordered vancomycin and Zosyn for his leg infection. I discussed the case with the hospitalist for admission.      ____________________________________________   FINAL CLINICAL IMPRESSION(S) / ED DIAGNOSES    Final diagnoses:  Symptomatic anemia  Leg osteomyelitis, right (HCC)  right leg cellulitis hyponatremia   New Prescriptions   No medications on file     Portions of this note were generated with dragon dictation software. Dictation errors may occur despite best attempts at proofreading.    Carrie Mew, MD 11/12/16 [id number]

## 2016-11-13 DIAGNOSIS — I252 Old myocardial infarction: Secondary | ICD-10-CM

## 2016-11-13 DIAGNOSIS — R7989 Other specified abnormal findings of blood chemistry: Secondary | ICD-10-CM

## 2016-11-13 DIAGNOSIS — I96 Gangrene, not elsewhere classified: Secondary | ICD-10-CM

## 2016-11-13 DIAGNOSIS — N183 Chronic kidney disease, stage 3 (moderate): Secondary | ICD-10-CM

## 2016-11-13 DIAGNOSIS — Z79899 Other long term (current) drug therapy: Secondary | ICD-10-CM

## 2016-11-13 DIAGNOSIS — L03115 Cellulitis of right lower limb: Secondary | ICD-10-CM

## 2016-11-13 DIAGNOSIS — F5089 Other specified eating disorder: Secondary | ICD-10-CM

## 2016-11-13 DIAGNOSIS — Z792 Long term (current) use of antibiotics: Secondary | ICD-10-CM

## 2016-11-13 DIAGNOSIS — D649 Anemia, unspecified: Secondary | ICD-10-CM

## 2016-11-13 DIAGNOSIS — R5383 Other fatigue: Secondary | ICD-10-CM

## 2016-11-13 DIAGNOSIS — D509 Iron deficiency anemia, unspecified: Secondary | ICD-10-CM

## 2016-11-13 DIAGNOSIS — M79604 Pain in right leg: Secondary | ICD-10-CM

## 2016-11-13 DIAGNOSIS — F1210 Cannabis abuse, uncomplicated: Secondary | ICD-10-CM

## 2016-11-13 DIAGNOSIS — I350 Nonrheumatic aortic (valve) stenosis: Secondary | ICD-10-CM

## 2016-11-13 DIAGNOSIS — D473 Essential (hemorrhagic) thrombocythemia: Secondary | ICD-10-CM

## 2016-11-13 DIAGNOSIS — I10 Essential (primary) hypertension: Secondary | ICD-10-CM

## 2016-11-13 DIAGNOSIS — Z87891 Personal history of nicotine dependence: Secondary | ICD-10-CM

## 2016-11-13 DIAGNOSIS — Z7982 Long term (current) use of aspirin: Secondary | ICD-10-CM

## 2016-11-13 LAB — COMPREHENSIVE METABOLIC PANEL
ALK PHOS: 107 U/L (ref 38–126)
ALT: 9 U/L — ABNORMAL LOW (ref 17–63)
ANION GAP: 9 (ref 5–15)
AST: 19 U/L (ref 15–41)
Albumin: 2.3 g/dL — ABNORMAL LOW (ref 3.5–5.0)
BUN: 25 mg/dL — ABNORMAL HIGH (ref 6–20)
CALCIUM: 7.9 mg/dL — AB (ref 8.9–10.3)
CO2: 24 mmol/L (ref 22–32)
Chloride: 98 mmol/L — ABNORMAL LOW (ref 101–111)
Creatinine, Ser: 1.51 mg/dL — ABNORMAL HIGH (ref 0.61–1.24)
GFR calc non Af Amer: 45 mL/min — ABNORMAL LOW (ref >60)
GFR, EST AFRICAN AMERICAN: 52 mL/min — AB (ref >60)
Glucose, Bld: 111 mg/dL — ABNORMAL HIGH (ref 65–99)
Potassium: 3.3 mmol/L — ABNORMAL LOW (ref 3.5–5.1)
Sodium: 131 mmol/L — ABNORMAL LOW (ref 135–145)
TOTAL PROTEIN: 6.2 g/dL — AB (ref 6.5–8.1)
Total Bilirubin: 3.6 mg/dL — ABNORMAL HIGH (ref 0.3–1.2)

## 2016-11-13 LAB — URINALYSIS, COMPLETE (UACMP) WITH MICROSCOPIC
Bacteria, UA: NONE SEEN
Bilirubin Urine: NEGATIVE
Glucose, UA: NEGATIVE mg/dL
Ketones, ur: NEGATIVE mg/dL
Nitrite: NEGATIVE
Protein, ur: NEGATIVE mg/dL
Specific Gravity, Urine: 1.016 (ref 1.005–1.030)
pH: 5 (ref 5.0–8.0)

## 2016-11-13 LAB — CBC
HCT: 20 % — ABNORMAL LOW (ref 40.0–52.0)
HEMOGLOBIN: 6.2 g/dL — AB (ref 13.0–18.0)
MCH: 19.6 pg — ABNORMAL LOW (ref 26.0–34.0)
MCHC: 31.1 g/dL — AB (ref 32.0–36.0)
MCV: 63.1 fL — ABNORMAL LOW (ref 80.0–100.0)
Platelets: 643 10*3/uL — ABNORMAL HIGH (ref 150–440)
RBC: 3.17 MIL/uL — AB (ref 4.40–5.90)
RDW: 27.3 % — ABNORMAL HIGH (ref 11.5–14.5)
WBC: 9.8 10*3/uL (ref 3.8–10.6)

## 2016-11-13 LAB — MAGNESIUM: Magnesium: 1.8 mg/dL (ref 1.7–2.4)

## 2016-11-13 LAB — HEMOGLOBIN AND HEMATOCRIT, BLOOD
HCT: 24.5 % — ABNORMAL LOW (ref 40.0–52.0)
HEMOGLOBIN: 7.4 g/dL — AB (ref 13.0–18.0)

## 2016-11-13 LAB — PREPARE RBC (CROSSMATCH)

## 2016-11-13 LAB — VITAMIN B12: Vitamin B-12: 269 pg/mL (ref 180–914)

## 2016-11-13 LAB — GLUCOSE, CAPILLARY
GLUCOSE-CAPILLARY: 110 mg/dL — AB (ref 65–99)
GLUCOSE-CAPILLARY: 104 mg/dL — AB (ref 65–99)

## 2016-11-13 LAB — RETICULOCYTES
RBC.: 3.17 MIL/uL — ABNORMAL LOW (ref 4.40–5.90)
Retic Count, Absolute: 57.1 10*3/uL (ref 19.0–183.0)
Retic Ct Pct: 1.8 % (ref 0.4–3.1)

## 2016-11-13 LAB — LACTATE DEHYDROGENASE: LDH: 117 U/L (ref 98–192)

## 2016-11-13 MED ORDER — POTASSIUM CHLORIDE CRYS ER 20 MEQ PO TBCR
40.0000 meq | EXTENDED_RELEASE_TABLET | Freq: Once | ORAL | Status: AC
  Administered 2016-11-13: 40 meq via ORAL
  Filled 2016-11-13: qty 2

## 2016-11-13 MED ORDER — SODIUM CHLORIDE 0.9 % IV SOLN: Freq: Once | INTRAVENOUS | Status: DC

## 2016-11-13 MED ORDER — POLYETHYLENE GLYCOL 3350 17 GM/SCOOP PO POWD
1.0000 | Freq: Once | ORAL | Status: AC
  Administered 2016-11-13: 255 g via ORAL
  Filled 2016-11-13: qty 255

## 2016-11-13 MED ORDER — VANCOMYCIN HCL IN DEXTROSE 1-5 GM/200ML-% IV SOLN
1000.0000 mg | INTRAVENOUS | Status: DC
  Administered 2016-11-13: 1000 mg via INTRAVENOUS
  Filled 2016-11-13 (×2): qty 200

## 2016-11-13 MED ORDER — PANTOPRAZOLE SODIUM 40 MG PO TBEC
40.0000 mg | DELAYED_RELEASE_TABLET | Freq: Two times a day (BID) | ORAL | Status: DC
  Administered 2016-11-13 – 2016-11-25 (×22): 40 mg via ORAL
  Filled 2016-11-13 (×21): qty 1

## 2016-11-13 NOTE — Progress Notes (Signed)
Pharmacy Antibiotic Note  Tristan [name] is a 71 y.o. male admitted on 11/12/2016 with sepsis.  Pharmacy has been consulted for Vancomycin and Zosyn dosing.  Plan: Vancomycin 1250 mg IV every 24 hours.  Goal trough 15-20 mcg/mL. Zosyn 3.375g IV q8h (4 hour infusion). Will check a trough level prior to 5th dose.   10/29:  Scr improved. Will adjust Vancomycin to 1 gram IV q18h.  Ke 0.045  T1/2 15.4  Vd 55. Check trough 10/31 prior to 5th dose.    Height: 5\' 11"  (180.3 cm) Weight: 174 lb (78.9 kg) IBW/kg (Calculated) : 75.3  Temp (24hrs), Avg:99.2 F (37.3 C), Min:97.6 F (36.4 C), Max:100.8 F (38.2 C)   Recent Labs Lab 11/12/16 1233 11/13/16 0453  WBC 10.8* 9.8  CREATININE 1.84* 1.51*    Estimated Creatinine Clearance: 48.5 mL/min (A) (by C-G formula based on SCr of 1.51 mg/dL (H)).    No Known Allergies  Antimicrobials this admission: Vancomycin 10/28 >>  Zosyn 10/28 >>    Thank you for allowing pharmacy to be a part of this patient's care.  Chinita Greenland PharmD Clinical Pharmacist 11/13/2016

## 2016-11-13 NOTE — Consult Note (Signed)
[facility] Vascular Consult Note  MRN : [id number]  Tristan [name] is a 71 y.o. (October 27, 1945) male who presents with chief complaint of  Chief Complaint  Patient presents with  . Foot Pain  .  History of Present Illness: I am asked by Dr. Manuella Ghazi and Dr. Vickki Muff to see the patient regarding right lower extremity gangrene.  The patient has a previous history of peripheral arterial disease and had revascularization years ago, but has not followed up regularly to the clinic.  Over the past several weeks to months he has noticed worsening right foot and leg pain.  The pain keeps him awake at night and he has to dangle his foot for any relief.  As this has steadily progressed, he has had worsening skin discoloration and now a black eschar on the top of the foot, ankle, and lateral lower leg.  This is very dry and ischemic appearing.  The area is very painful.  He is also admitted with profound anemia and even after multiple units of packed red blood cells his hemoglobin was only 6.2 today.  This is following at least 2 units of blood transfusion and possibly more.  Hematology has seen the patient and will be following.  He denies any trauma or injury to the leg.  The skin changes and pain were gradual in their onset and worsening over several weeks.  He denies any fever or chills.  Denies left leg symptoms.  Current Facility-Administered Medications  Medication Dose Route Frequency Provider Last Rate Last Dose  . 0.9 %  sodium chloride infusion   Intravenous Continuous Idelle Crouch, MD 50 mL/hr at 11/13/16 0220    . 0.9 %  sodium chloride infusion   Intravenous Once Pyreddy, Reatha Harps, MD      . acetaminophen (TYLENOL) tablet 650 mg  650 mg Oral Q6H PRN Idelle Crouch, MD   650 mg at 11/12/16 2319   Or  . acetaminophen (TYLENOL) suppository 650 mg  650 mg Rectal Q6H PRN Idelle Crouch, MD      . allopurinol (ZYLOPRIM) tablet 100 mg  100 mg Oral Daily Idelle Crouch,  MD   100 mg at 11/13/16 1155  . bisacodyl (DULCOLAX) suppository 10 mg  10 mg Rectal Daily PRN Idelle Crouch, MD      . docusate sodium (COLACE) capsule 100 mg  100 mg Oral BID Idelle Crouch, MD   100 mg at 11/13/16 1155  . heparin injection 5,000 Units  5,000 Units Subcutaneous Q8H Idelle Crouch, MD   5,000 Units at 11/13/16 1528  . ondansetron (ZOFRAN) tablet 4 mg  4 mg Oral Q6H PRN Idelle Crouch, MD       Or  . ondansetron (ZOFRAN) injection 4 mg  4 mg Intravenous Q6H PRN Idelle Crouch, MD      . pantoprazole (PROTONIX) EC tablet 40 mg  40 mg Oral BID AC Shah, Vipul, MD      . piperacillin-tazobactam (ZOSYN) IVPB 3.375 g  3.375 g Intravenous Q8H Fulton Reek D, MD 12.5 mL/hr at 11/13/16 1528 3.375 g at 11/13/16 1528  . potassium chloride SA (K-DUR,KLOR-CON) CR tablet 40 mEq  40 mEq Oral Once Max Sane, MD      . vancomycin (VANCOCIN) IVPB 1000 mg/200 mL premix  1,000 mg Intravenous Q18H Carrie Mew, MD        Past Medical History:  Diagnosis Date  . Hypertension     Past Surgical  History: Percutaneous right lower extremity revascularization 2015  Social History Social History  Substance Use Topics  . Smoking status: Former Smoker    Quit date: 06/09/1979  . Smokeless tobacco: Never Used  . Alcohol use No  No IV drug use  Family History Family History  Problem Relation Age of Onset  . Hypertension Brother   No bleeding disorders, clotting disorders, autoimmune diseases, or aneurysms  No Known Allergies   REVIEW OF SYSTEMS (Negative unless checked)  Constitutional: [] Weight loss  [] Fever  [] Chills Cardiac: [] Chest pain   [] Chest pressure   [] Palpitations   [] Shortness of breath when laying flat   [] Shortness of breath at rest   [x] Shortness of breath with exertion. Vascular:  [x] Pain in legs with walking   [] Pain in legs at rest   [] Pain in legs when laying flat   [] Claudication   [] Pain in feet when walking  [] Pain in feet at rest  [x] Pain in  feet when laying flat   [] History of DVT   [] Phlebitis   [x] Swelling in legs   [] Varicose veins   [x] Non-healing ulcers Pulmonary:   [] Uses home oxygen   [] Productive cough   [] Hemoptysis   [] Wheeze  [] COPD   [] Asthma Neurologic:  [] Dizziness  [] Blackouts   [] Seizures   [] History of stroke   [] History of TIA  [] Aphasia   [] Temporary blindness   [] Dysphagia   [] Weakness or numbness in arms   [] Weakness or numbness in legs Musculoskeletal:  [x] Arthritis   [] Joint swelling   [] Joint pain   [] Low back pain Hematologic:  [] Easy bruising  [] Easy bleeding   [] Hypercoagulable state   [x] Anemic  [] Hepatitis Gastrointestinal:  [] Blood in stool   [] Vomiting blood  [x] Gastroesophageal reflux/heartburn   [] Difficulty swallowing. Genitourinary:  [] Chronic kidney disease   [] Difficult urination  [] Frequent urination  [] Burning with urination   [] Blood in urine Skin:  [] Rashes   [x] Ulcers   [x] Wounds Psychological:  [] History of anxiety   []  History of major depression.  Physical Examination  Vitals:   11/13/16 0737 11/13/16 0909 11/13/16 0925 11/13/16 1141  BP: (!) 101/55 (!) 92/48 (!) 99/50 (!) 102/53  Pulse: (!) 103 (!) 105 (!) 101 98  Resp:  18 18   Temp: 98.5 F (36.9 C) 98.2 F (36.8 C) 98.5 F (36.9 C) 99.4 F (37.4 C)  TempSrc: Oral Axillary Oral   SpO2: 99% 97% 98% 97%  Weight:      Height:       Body mass index is 24.27 kg/m. Gen:  WD/WN, NAD Head: [place]/AT, No temporalis wasting.  Ear/Nose/Throat: Hearing grossly intact, nares w/o erythema or drainage, oropharynx w/o Erythema/Exudate Eyes: Sclera non-icteric, conjunctiva clear Neck: Trachea midline.  No JVD.  Pulmonary:  Good air movement, respirations not labored, equal bilaterally.  Cardiac: RRR, normal S1, S2. Vascular:  Vessel Right Left  Radial Palpable Palpable                          PT  not palpable  trace palpable  DP  not palpable  1+ palpable   Gastrointestinal: soft, non-tender/non-distended.  Musculoskeletal:  M/S 5/5 throughout.  2+ right lower extremity edema, trace left lower extremity edema.  Large irregular necrotic eschar is present throughout the top of the foot, ankle, and lower leg tracking laterally.  This is pale and ischemic appearing.  There is dry scaly skin surrounding this.  This is associated with swelling in the tissue no left leg ulcerations. Neurologic:  Sensation grossly intact in extremities.  Symmetrical.  Speech is fluent. Motor exam as listed above. Psychiatric: Judgment intact, Mood & affect appropriate for pt's clinical situation. Dermatologic: Gangrene of right foot and lower leg as described above      CBC Lab Results  Component Value Date   WBC 9.8 11/13/2016   HGB 6.2 (L) 11/13/2016   HCT 20.0 (L) 11/13/2016   MCV 63.1 (L) 11/13/2016   PLT 643 (H) 11/13/2016    BMET    Component Value Date/Time   NA 131 (L) 11/13/2016 0453   NA 140 11/20/2013 0447   K 3.3 (L) 11/13/2016 0453   K 4.0 11/20/2013 0447   CL 98 (L) 11/13/2016 0453   CL 104 11/20/2013 0447   CO2 24 11/13/2016 0453   CO2 28 11/20/2013 0447   GLUCOSE 111 (H) 11/13/2016 0453   GLUCOSE 83 11/20/2013 0447   BUN 25 (H) 11/13/2016 0453   BUN 8 11/20/2013 0447   CREATININE 1.51 (H) 11/13/2016 0453   CREATININE 1.18 11/24/2013 0501   CALCIUM 7.9 (L) 11/13/2016 0453   CALCIUM 8.8 11/20/2013 0447   GFRNONAA 45 (L) 11/13/2016 0453   GFRNONAA >60 11/24/2013 0501   GFRAA 52 (L) 11/13/2016 0453   GFRAA >60 11/24/2013 0501   Estimated Creatinine Clearance: 48.5 mL/min (A) (by C-G formula based on SCr of 1.51 mg/dL (H)).  COAG Lab Results  Component Value Date   INR 1.68 06/08/2016    Radiology Dg Tibia/fibula Right  Result Date: 11/12/2016 CLINICAL DATA:  Right leg cellulitis and necrotic soft tissue wound. EXAM: RIGHT TIBIA AND FIBULA - 2 VIEW COMPARISON:  None. FINDINGS: Diffuse soft tissue swelling is seen. No evidence of soft tissue gas. Stent is seen within the right popliteal artery.  Peripheral vascular calcification noted. No evidence of osteolysis or periostitis. No evidence of fracture or other focal bone lesion. IMPRESSION: Diffuse soft tissue swelling. No radiographic evidence of osteomyelitis or other osseous abnormality. Electronically Signed   By: Earle Gell M.D.   On: 11/12/2016 16:40   Dg Foot Complete Right  Result Date: 11/12/2016 CLINICAL DATA:  Soft tissue ulcer necrosis along dorsal aspect of foot. EXAM: RIGHT FOOT COMPLETE - 3+ VIEW COMPARISON:  11/19/2013 FINDINGS: Soft tissue swelling seen along the dorsal aspect of the midfoot. No evidence of soft tissue gas. No evidence of osteolysis or periostitis. No other focal bone lesions identified. Generalized osteopenia again noted. 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 IMPRESSION: Dorsal soft tissue swelling. No radiographic evidence of osteomyelitis or other acute osseous abnormality. 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 Electronically Signed   By: Earle Gell M.D.   On: 11/12/2016 14:58      Assessment/Plan 1.  PAD with gangrene right lower extremity.  Clinically, the leg appears to be chronically ischemic.  He has a previous history of peripheral arterial disease but has not followed up and he does not know the last time this was checked.  This is clearly a limb threatening situation and I think his limb loss risk is extremely high and is probably the most likely outcome even with revascularization.  But the gangrenous changes go well up the foot and lower leg.  I believe the patient should have an angiogram for further evaluation and salvage.  This is complicated by his profound anemia as he would require heparin for the procedure as well as at least dual antiplatelet therapy if any revascularization is performed.  I discussed with the  patient that even if we are able to perform revascularization, if his tissue destruction may be too extensive to save the foot.  My fear  would be at this point he would require an above-knee amputation, but with revascularization limb salvage may be possible and hopefully we can make this a below-knee amputation instead of him and above-knee amputation.  Given his  profound anemia and other issues, I will tentatively schedule an angiogram for Wednesday.  Appreciate all other consultant and primary service input with this difficult and complex case. 2.  Profound symptomatic anemia.  Has gotten several units of packed red blood cells.  May need more transfusion.  Procedure scheduled for Wednesday and should at least be typed and crossed.  GI to see the patient and hematology has already seen the patient. 3.  Hypertension.  Stable on outpatient medications and blood pressure control important in reducing the progression of atherosclerotic disease. On appropriate oral medications. 4.  CKD.  Stage III.  Creatinine clearance was 45 recently.  Will need continued hydration to avoid contrast nephropathy with angiography.  Overall, this is a complex and difficult case with a very high risk of limb loss.  The patient seems to have that understanding today on our discussion.  I discussed we will try to get an angiogram done for any hope of revascularization, but he has multiple other ongoing issues as well as above.  Leotis Pain, MD  11/13/2016 4:29 PM    This note was created with Dragon medical transcription system.  Any error is purely unintentional

## 2016-11-13 NOTE — Progress Notes (Signed)
Pt. Hemoglobin is 6.2. Dr. Estanislado Pandy notified and ordered one more unit of blood.

## 2016-11-13 NOTE — Progress Notes (Signed)
[facility] [employer] NAME: Mathis Cashman    MR#:  [id number]  DATE OF BIRTH:  Jul 06, 1945  SUBJECTIVE:  CHIEF COMPLAINT:   Chief Complaint  Patient presents with  . Foot Pain  Right foot pain, feels he cannot go back home like this REVIEW OF SYSTEMS:  Review of Systems  Constitutional: Negative for chills, fever and weight loss.  HENT: Negative for nosebleeds and sore throat.   Eyes: Negative for blurred vision.  Respiratory: Negative for cough, shortness of breath and wheezing.   Cardiovascular: Negative for chest pain, orthopnea, leg swelling and PND.  Gastrointestinal: Negative for abdominal pain, constipation, diarrhea, heartburn, nausea and vomiting.  Genitourinary: Negative for dysuria and urgency.  Musculoskeletal: Positive for joint pain (rt foot). Negative for back pain.  Skin: Negative for rash.  Neurological: Negative for dizziness, speech change, focal weakness and headaches.  Endo/Heme/Allergies: Does not bruise/bleed easily.  Psychiatric/Behavioral: Negative for depression.   DRUG ALLERGIES:  No Known Allergies VITALS:  Blood pressure (!) 101/55, pulse (!) 103, temperature 98.5 F (36.9 C), temperature source Oral, resp. rate 18, height 5\' 11"  (1.803 m), weight 78.9 kg (174 lb), SpO2 99 %. PHYSICAL EXAMINATION:  Physical Exam  Constitutional: He is oriented to person, place, and time and well-developed, well-nourished, and in no distress.  HENT:  Head: Normocephalic and atraumatic.  Eyes: Pupils are equal, round, and reactive to light. Conjunctivae and EOM are normal.  Neck: Normal range of motion. Neck supple. No tracheal deviation present. No thyromegaly present.  Cardiovascular: Normal rate, regular rhythm and normal heart sounds.   Pulses:      Dorsalis pedis pulses are 0 on the right side.  Pulmonary/Chest: Effort normal and breath sounds normal. No respiratory distress. He has no wheezes. He exhibits no  tenderness.  Abdominal: Soft. Bowel sounds are normal. He exhibits no distension. There is no tenderness.  Musculoskeletal: Normal range of motion.       Right ankle: He exhibits swelling and abnormal pulse.  Right leg has a very large ischemic necrotic area of skin from the mid tibia on the anterior aspect of the leg extending across the ankle to the dorsal aspect of the midfoot to forefoot region. Dry necrotic tissue is present. Lymphangitic streak on the anterior mid leg coursing proximally along the anterior mid portion of the leg proximally.  right fifth toe looks gangrenous   Neurological: He is alert and oriented to person, place, and time. No cranial nerve deficit.  Skin: Skin is warm and dry. Rash noted.     Necrotic/gangrenous skin on the right foot area  Psychiatric: Mood and affect normal.   LABORATORY PANEL:  Male CBC  Recent Labs Lab 11/13/16 0453  WBC 9.8  HGB 6.2*  HCT 20.0*  PLT 643*   ------------------------------------------------------------------------------------------------------------------ Chemistries   Recent Labs Lab 11/13/16 0453  NA 131*  K 3.3*  CL 98*  CO2 24  GLUCOSE 111*  BUN 25*  CREATININE 1.51*  CALCIUM 7.9*  AST 19  ALT 9*  ALKPHOS 107  BILITOT 3.6*   RADIOLOGY:  Dg Tibia/fibula Right  Result Date: 11/12/2016 CLINICAL DATA:  Right leg cellulitis and necrotic soft tissue wound. EXAM: RIGHT TIBIA AND FIBULA - 2 VIEW COMPARISON:  None. FINDINGS: Diffuse soft tissue swelling is seen. No evidence of soft tissue gas. Stent is seen within the right popliteal artery. Peripheral vascular calcification noted. No evidence of osteolysis or  periostitis. No evidence of fracture or other focal bone lesion. IMPRESSION: Diffuse soft tissue swelling. No radiographic evidence of osteomyelitis or other osseous abnormality. Electronically Signed   By: Earle Gell M.D.   On: 11/12/2016 16:40   Dg Foot Complete Right  Result Date: 11/12/2016 CLINICAL  DATA:  Soft tissue ulcer necrosis along dorsal aspect of foot. EXAM: RIGHT FOOT COMPLETE - 3+ VIEW COMPARISON:  11/19/2013 FINDINGS: Soft tissue swelling seen along the dorsal aspect of the midfoot. No evidence of soft tissue gas. No evidence of osteolysis or periostitis. No other focal bone lesions identified. Generalized osteopenia again noted. 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 IMPRESSION: Dorsal soft tissue swelling. No radiographic evidence of osteomyelitis or other acute osseous abnormality. 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 Electronically Signed   By: Earle Gell M.D.   On: 11/12/2016 14:58   ASSESSMENT AND PLAN:  [name] a 71 y.o.malewith a known history of Hypertension, severe anemia admitted for right foot ulcer with severe cellulitis.    *Sepsis: Present on admission due to right leg infection -We will hold Coreg due to hypotension  *Severe acute on chronic microcytic anemia: Likely of chronic disease, Stool is guaiac negative.  Some iron deficiency - Was evaluated by GI on last admission in May 2018 and did not recommend any luminal evaluation at that point - He required 2 units of packed red blood cell transfusion on last admission - Hemoglobin is 6.2 - 1 unit of packed red blood cell ordered, hold aspirin -GI consultation, discussed with Dr. Lucilla Lame  * Necrotic gangrenous skin right lower leg/cellulitis - Appreciate podiatry evaluation.  They recommend vascular evaluation.  Patient is at high risk of needing amputation.  - Continue IV vancomycin and Zosyn  * Acute renal failure appears suspected pre-renal azotemia -recievedIV fluids, continue monitoring, avoid nephrotoxic agents  * Severe cardiomyopathy EF of 25% and severe ASnoted on recent echo - followed by Cardiology Dr Humphrey Rolls  - Due to renal failure and severe anemia no catheterization was performed on last admission      All the records are  reviewed and case discussed with Care Management/Social Worker. Management plans discussed with the patient, nursing and they are in agreement.  CODE STATUS: Full Code  TOTAL TIME TAKING CARE OF THIS PATIENT: 35 minutes.   More than 50% of the time was spent in counseling/coordination of care: YES  POSSIBLE D/C IN 3-4 DAYS, DEPENDING ON CLINICAL CONDITION.  And vascular surgery/podiatry evaluation  Max Sane M.D on 11/13/2016 at 8:39 AM  Between 7am to 6pm - Pager - [phone]  After 6pm go to [email or web address] - Proofreader  [facility]  Office  [phone]  CC: Primary care physician; Patient, No Pcp Per  Note: This dictation was prepared with Dragon dictation along with smaller phrase technology. Any transcriptional errors that result from this process are unintentional.

## 2016-11-13 NOTE — Consult Note (Addendum)
[facility]  Date of admission:  11/12/2016  Inpatient day:  11/13/2016  Consulting physician:  Dr Fulton Reek   Reason for Consultation:  Anemia, thrombocytosis.  Chief Complaint: Tristan [name] is a 71 y.o. male with a history of iron deficiency anemia who was admitted with symptomatic anemia and right lower extremity cellulitis.  HPI:  The patient was last admitted to [facility] from 06/08/2016 - 06/12/2016.  He presented with symptomatic anemia, acute renal insufficiency, NSTEMI, and elevated liver function tests. He described fatigue and shortness of breath.  Microcytic anemia developed in the past 2 years.  Hemoglobin was 3.7 with a hematocrit of 15.3 and an MCV of 61.6.  Peripheral smearrevealed microcytic anemia and neutrophilia. There were no schistocytes.  Diet appeared good. He had ice pica. He had never had a colonoscopy or EGD.   Hemoglobin improved from 3.7 to 7.6 after 4 units of PRBCs.   Work-up on admissionconfirmediron deficiency anemia(ferritin 7; iron saturation 7%). Normal labsincluded: B12, folate, TSH, HIV testing, acute hepatitis panel, hepatitis B core antibody, Coombs, and SPEP.  Free light chain ratio was slightly elevated (2.09) of unclear significance. There was no evidence of hemolysis. Stools were guaiac negative.  Plan was for outpatient EGD and colonoscopy.  His elevated creatinine was felt secondary to pre-renal azotemia. Elevated LFTs were felt secondary to shock liver or hepatitis. Elevated troponins secondary to ischemia induced by severe anemia. Urinalysis suggests potential source of infection or source of blood loss. Lactic acid was initially elevated (14.5).  Echo today revealed severe aortic stenosis with an EF of 25%.  He was discharged on oral iron.  Hematocrit was 24.3, hemoglobin 7.7, and MCV 66.7.  He was to follow-up in the oncology clinic for possible IV iron.  He did not return for follow-up.  He states  that he followed up with cardiology.  He is unsure if he took oral iron.  He states that he "took what they gave me".  He presented to the ER on 11/12/2016 with acute pain in the right leg and foot with inability to walk.  He states that he had a fever.  He denies any melena, hematochezia, or hematuria.  Labs revealed a hematocrit of 18.5, hemoglobin 5.3, MCV 57.9, platelets 798,000, WBC 10,800 with an ANC of 9200.  Creatinine was 1.84 (stable).  Albumen was 2.9 with a bilirubin of 1.9.  SGOT and SGPT were normal.  Ferritin was 30 with an iron saturation of 3% and a TIBC of 259.  Sed rate was 107.  He was started on vancomycin and Zosyn.   Past Medical History:  Diagnosis Date  . Hypertension     History reviewed. No pertinent surgical history.  Family History  Problem Relation Age of Onset  . Hypertension Brother     Social History:  reports that he quit smoking about 37 years ago. He has never used smokeless tobacco. He reports that he uses drugs, including Marijuana. He reports that he does not drink alcohol.   He reports that he uses drugs, including Marijuana. He reports that he does not drink alcohol.  He denies any exposure to radiation or toxins.  He lives in an apartment alone in [place].  He has 2 children who he does not see.  He "worked a band saw for 17 years".  He is alone today.  Allergies: No Known Allergies  Medications Prior to Admission  Medication Sig Dispense Refill  . allopurinol (ZYLOPRIM) 100 MG tablet Take 1  tablet (100 mg total) by mouth daily. 30 tablet 1  . aspirin EC 81 MG tablet Take 1 tablet (81 mg total) by mouth daily. 30 tablet 2  . carvedilol (COREG) 6.25 MG tablet Take 1 tablet (6.25 mg total) by mouth 2 (two) times daily with a meal. (Patient taking differently: Take 25 mg by mouth 2 (two) times daily with a meal. ) 60 tablet 0  . furosemide (LASIX) 20 MG tablet Take 20 mg by mouth daily.    Marland Kitchen losartan (COZAAR) 25 MG tablet Take 25 mg by mouth  daily.    . pantoprazole (PROTONIX) 40 MG tablet Take 1 tablet (40 mg total) by mouth daily. (Patient taking differently: Take 20-40 mg by mouth daily. ) 30 tablet 1  . cephALEXin (KEFLEX) 500 MG capsule Take 1 capsule (500 mg total) by mouth every 12 (twelve) hours. (Patient not taking: Reported on 11/12/2016) 10 capsule 0    Review of Systems: GENERAL:  Fatigue.  Fever at home.  Nosweats.  Weight up 4 pounds since 05/2016 admission. PERFORMANCE STATUS (ECOG):  1 HEENT:  No visual changes, runny nose, sore throat, mouth sores or tenderness. Lungs: Shortness of breath with exertion.  No cough.  No hemoptysis. Cardiac:  Aortic stenosis.  No chest pain, palpitations, orthopnea, or PND. GI:  No nausea, vomiting, diarrhea, constipation, melena or hematochezia. GU: No urgency, frequency, or hematuria. Musculoskeletal:  No back pain.  No joint pain.  No muscle tenderness. Extremities:  h/o gout x 20-30 years.  No pain or swelling. Skin:  No rashes or skin changes. Neuro:  No headache, numbness or weakness, balance or coordination issues. Endocrine:  No diabetes, thyroid issues, hot flashes or night sweats. Psych:  No mood changes, depression or anxiety. Pain: Right lower extremity pain. Review of systems:  All other systems reviewed and found to be negative.  Physical Exam:  Blood pressure (!) 102/53, pulse 98, temperature 99.4 F (37.4 C), resp. rate 18, height 5' 11"  (1.803 m), weight 174 lb (78.9 kg), SpO2 97 %. GENERAL:  Thin gentleman sitting comfortably on the medical unit in no acute distress. MENTAL STATUS:  Alert and oriented to person, place and time. HEAD:  Pearline Cables hair.  Normocephalic, atraumatic, face symmetric, no Cushingoid features. EYES:  Brown eyes.  Pupils equal round and reactive to light and accomodation.  No conjunctivitis or scleral icterus. ENT:  Oropharynx clear without lesion.  Tongue normal.  Poor dentition.  Mucous membranes moist.  RESPIRATORY:  Clear to auscultation  without rales, wheezes or rhonchi. CARDIOVASCULAR:  Regular rate and rhythm with III/VI murmur.  No rub or gallop. ABDOMEN:  Soft, non-tender, with active bowel sounds, and no hepatosplenomegaly.  No masses. SKIN:  No rashes, ulcers or lesions. EXTREMITIES: Right lower extremity wrapped to ankle.  Right foot with large ulcer with black base.  Foot is edematous.  Chronic left lower extremity changes.  No palpable cords. LYMPH NODES: No palpable cervical, supraclavicular, axillary or inguinal adenopathy  NEUROLOGICAL: Unremarkable. PSYCH:  Appropriate.   Results for orders placed or performed during the hospital encounter of 11/12/16 (from the past 48 hour(s))  CBC with Differential     Status: Abnormal   Collection Time: 11/12/16 12:33 PM  Result Value Ref Range   WBC 10.8 (H) 3.8 - 10.6 K/uL   RBC 3.19 (L) 4.40 - 5.90 MIL/uL   Hemoglobin 5.3 (L) 13.0 - 18.0 g/dL   HCT 18.5 (L) 40.0 - 52.0 %   MCV 57.9 (L) 80.0 -  100.0 fL   MCH 16.5 (L) 26.0 - 34.0 pg   MCHC 28.4 (L) 32.0 - 36.0 g/dL   RDW 21.3 (H) 11.5 - 14.5 %   Platelets 798 (H) 150 - 440 K/uL   Neutrophils Relative % 84 %   Neutro Abs 9.2 (H) 1.4 - 6.5 K/uL   Lymphocytes Relative 4 %   Lymphs Abs 0.4 (L) 1.0 - 3.6 K/uL   Monocytes Relative 11 %   Monocytes Absolute 1.2 (H) 0.2 - 1.0 K/uL   Eosinophils Relative 0 %   Eosinophils Absolute 0.0 0 - 0.7 K/uL   Basophils Relative 1 %   Basophils Absolute 0.1 0 - 0.1 K/uL  Comprehensive metabolic panel     Status: Abnormal   Collection Time: 11/12/16 12:33 PM  Result Value Ref Range   Sodium 129 (L) 135 - 145 mmol/L   Potassium 4.1 3.5 - 5.1 mmol/L   Chloride 92 (L) 101 - 111 mmol/L   CO2 26 22 - 32 mmol/L   Glucose, Bld 145 (H) 65 - 99 mg/dL   BUN 31 (H) 6 - 20 mg/dL   Creatinine, Ser 1.84 (H) 0.61 - 1.24 mg/dL   Calcium 8.7 (L) 8.9 - 10.3 mg/dL   Total Protein 7.8 6.5 - 8.1 g/dL   Albumin 2.9 (L) 3.5 - 5.0 g/dL   AST 24 15 - 41 U/L   ALT 14 (L) 17 - 63 U/L   Alkaline  Phosphatase 126 38 - 126 U/L   Total Bilirubin 1.9 (H) 0.3 - 1.2 mg/dL   GFR calc non Af Amer 35 (L) >60 mL/min   GFR calc Af Amer 41 (L) >60 mL/min    Comment: (NOTE) The eGFR has been calculated using the CKD EPI equation. This calculation has not been validated in all clinical situations. eGFR's persistently <60 mL/min signify possible Chronic Kidney Disease.    Anion gap 11 5 - 15  Sedimentation rate     Status: Abnormal   Collection Time: 11/12/16 12:33 PM  Result Value Ref Range   Sed Rate 107 (H) 0 - 20 mm/hr  Iron and TIBC     Status: Abnormal   Collection Time: 11/12/16 12:33 PM  Result Value Ref Range   Iron 7 (L) 45 - 182 ug/dL   TIBC 259 250 - 450 ug/dL   Saturation Ratios 3 (L) 17.9 - 39.5 %   UIBC 252 ug/dL  Ferritin     Status: None   Collection Time: 11/12/16 12:33 PM  Result Value Ref Range   Ferritin 30 24 - 336 ng/mL  Type and screen [place]     Status: None (Preliminary result)   Collection Time: 11/12/16  2:44 PM  Result Value Ref Range   ABO/RH(D) B POS    Antibody Screen NEG    Sample Expiration 11/15/2016    Unit Number [id number]    Blood Component Type RED CELLS,LR    Unit division 00    Status of Unit ISSUED,FINAL    Transfusion Status OK TO TRANSFUSE    Crossmatch Result Compatible    Unit Number [id number]    Blood Component Type RED CELLS,LR    Unit division 00    Status of Unit ISSUED    Transfusion Status OK TO TRANSFUSE    Crossmatch Result Compatible    Unit Number [id number]    Blood Component Type RED CELLS,LR    Unit division 00    Status of Unit ISSUED  Transfusion Status OK TO TRANSFUSE    Crossmatch Result Compatible   Prepare RBC     Status: None   Collection Time: 11/12/16  3:30 PM  Result Value Ref Range   Order Confirmation ORDER PROCESSED BY BLOOD BANK   Wound or Superficial Culture     Status: None (Preliminary result)   Collection Time: 11/12/16  3:48 PM  Result Value Ref Range    Specimen Description LEG PER RN ALICIA LEG ULCER    Special Requests NONE    Gram Stain      RARE WBC PRESENT, PREDOMINANTLY PMN MODERATE GRAM NEGATIVE RODS MODERATE GRAM POSITIVE COCCI Performed at [facility], [place] [address] [place], [place] [address]    Culture PENDING    Report Status PENDING   Vitamin B12     Status: None   Collection Time: 11/12/16  4:30 PM  Result Value Ref Range   Vitamin B-12 269 180 - 914 pg/mL    Comment: (NOTE) This assay is not validated for testing neonatal or myeloproliferative syndrome specimens for Vitamin B12 levels. Performed at [facility], [place] [address] [place], [place] [address]   Comprehensive metabolic panel     Status: Abnormal   Collection Time: 11/13/16  4:53 AM  Result Value Ref Range   Sodium 131 (L) 135 - 145 mmol/L   Potassium 3.3 (L) 3.5 - 5.1 mmol/L   Chloride 98 (L) 101 - 111 mmol/L   CO2 24 22 - 32 mmol/L   Glucose, Bld 111 (H) 65 - 99 mg/dL   BUN 25 (H) 6 - 20 mg/dL   Creatinine, Ser 1.51 (H) 0.61 - 1.24 mg/dL   Calcium 7.9 (L) 8.9 - 10.3 mg/dL   Total Protein 6.2 (L) 6.5 - 8.1 g/dL   Albumin 2.3 (L) 3.5 - 5.0 g/dL   AST 19 15 - 41 U/L   ALT 9 (L) 17 - 63 U/L   Alkaline Phosphatase 107 38 - 126 U/L   Total Bilirubin 3.6 (H) 0.3 - 1.2 mg/dL   GFR calc non Af Amer 45 (L) >60 mL/min   GFR calc Af Amer 52 (L) >60 mL/min    Comment: (NOTE) The eGFR has been calculated using the CKD EPI equation. This calculation has not been validated in all clinical situations. eGFR's persistently <60 mL/min signify possible Chronic Kidney Disease.    Anion gap 9 5 - 15  CBC     Status: Abnormal   Collection Time: 11/13/16  4:53 AM  Result Value Ref Range   WBC 9.8 3.8 - 10.6 K/uL   RBC 3.17 (L) 4.40 - 5.90 MIL/uL   Hemoglobin 6.2 (L) 13.0 - 18.0 g/dL   HCT 20.0 (L) 40.0 - 52.0 %   MCV 63.1 (L) 80.0 - 100.0 fL   MCH 19.6 (L) 26.0 - 34.0 pg   MCHC 31.1 (L) 32.0 - 36.0 g/dL   RDW 27.3 (H) 11.5 - 14.5 %    Platelets 643 (H) 150 - 440 K/uL  Reticulocytes     Status: Abnormal   Collection Time: 11/13/16  4:53 AM  Result Value Ref Range   Retic Ct Pct 1.8 0.4 - 3.1 %   RBC. 3.17 (L) 4.40 - 5.90 MIL/uL   Retic Count, Absolute 57.1 19.0 - 183.0 K/uL  Lactate dehydrogenase     Status: None   Collection Time: 11/13/16  4:53 AM  Result Value Ref Range   LDH 117 98 - 192 U/L  Prepare  RBC     Status: None   Collection Time: 11/13/16  6:45 AM  Result Value Ref Range   Order Confirmation ORDER PROCESSED BY BLOOD BANK   Glucose, capillary     Status: Abnormal   Collection Time: 11/13/16  7:53 AM  Result Value Ref Range   Glucose-Capillary 110 (H) 65 - 99 mg/dL   Comment 1 Notify RN    Dg Tibia/fibula Right  Result Date: 11/12/2016 CLINICAL DATA:  Right leg cellulitis and necrotic soft tissue wound. EXAM: RIGHT TIBIA AND FIBULA - 2 VIEW COMPARISON:  None. FINDINGS: Diffuse soft tissue swelling is seen. No evidence of soft tissue gas. Stent is seen within the right popliteal artery. Peripheral vascular calcification noted. No evidence of osteolysis or periostitis. No evidence of fracture or other focal bone lesion. IMPRESSION: Diffuse soft tissue swelling. No radiographic evidence of osteomyelitis or other osseous abnormality. Electronically Signed   By: Earle Gell M.D.   On: 11/12/2016 16:40   Dg Foot Complete Right  Result Date: 11/12/2016 CLINICAL DATA:  Soft tissue ulcer necrosis along dorsal aspect of foot. EXAM: RIGHT FOOT COMPLETE - 3+ VIEW COMPARISON:  11/19/2013 FINDINGS: Soft tissue swelling seen along the dorsal aspect of the midfoot. No evidence of soft tissue gas. No evidence of osteolysis or periostitis. No other focal bone lesions identified. Generalized osteopenia again noted. 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 IMPRESSION: Dorsal soft tissue swelling. No radiographic evidence of osteomyelitis or other acute osseous abnormality. 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 Electronically Signed   By: Earle Gell M.D.   On: 11/12/2016 14:58    Assessment:  The patient is a 71 y.o. gentleman with iron deficiency anemia and reactive thrombocytosis.  Per his history, diet is normal.  However, his albumen is low.  He denies any melena, hematochezia or hematuria.  Work-up during [facility] 06[facility]08[facility]2016 [facility] anemia(ferritin 7; iron saturation 7%). Normal labsincluded: B12, folate, TSH, HIV testing, acute hepatitis panel, hepatitis B core antibody, Coombs, and SPEP.  Free light chain ratio was slightly elevated (2.09) of unclear significance. Retic was 1.6% (low).  Peripheral smear revealed no schistocytes.  There was no evidence of hemolysis. Stools were guaiac negative.  He has never had an EGD or colonoscopy.  He has symptomatic anemia c/w ongoing iron deficiency.  Ferritin is 30 (falsely elevated secondary to high sed rate).  Iron saturation is 3% (low).  Retic is 1.8% (inappropriately low for level of anemia).  LDH is normal.  B12 is 269 (borderline).  He has a right foot cellulitis.  He is on vancomycin and Zosyn.  Sed rate is 107.  Plan:   1.  Hematology:  Iron deficiency anemia.  Patient received 2 units with improvement of hemoglobin from 5.3 to 6.2.  Suspect patient dry on admission (BUN 31 and Cr 1.84) and initial hemoglobin may have been lower.  He has subsequently received one additional unit.  Patient unclear if he has been taking oral iron.  Patient did not follow-up in our clinic for IV iron.  Discuss the plan for IV iron weekly.  Patient to receive Venofer 200 mg IV x 1 while hospitalized then additional infusions weekly in clinic (ferritin goal 100 if sed rate normal).   Patient states diet is good, although albumen is low.  No current evidence of GI or GU bleeding.  Guaiac all stools.  Urinalysis to r/o hematuria.  Anticipate EGD and colonoscopy either as an inpatient or  an  outpatient.  Suspect thrombocytosis will improve with resolution of iron deficiency anemia and management of infection.  Labs ordered:  retic, ANA, folate, LDH.  Plan to check MMA when creatinine has improved to document whether patient has B12 deficiency.  Follow CBC daily.  Maintain active type and screen.  Urinalysis to r/o hematuria.  Recheck SPEP in AM.  Goal hemoglobin is 7.5-8.0.   Thank you for allowing me to participate in [place] 's care.  I will follow him closely with you while hospitalized and after discharge in the outpatient department.   Lequita Asal, MD  11/13/2016, 11:51 AM

## 2016-11-13 NOTE — Progress Notes (Signed)
RLE open to air, no drainage. Patient removed gauge dressing stating it hurt.

## 2016-11-14 ENCOUNTER — Other Ambulatory Visit (INDEPENDENT_AMBULATORY_CARE_PROVIDER_SITE_OTHER): Payer: Self-pay | Admitting: Vascular Surgery

## 2016-11-14 ENCOUNTER — Inpatient Hospital Stay: Payer: Medicare Other | Admitting: Anesthesiology

## 2016-11-14 ENCOUNTER — Encounter
Admission: EM | Disposition: A | Discharge status: Skilled Nursing Facility | Payer: Self-pay | Source: Home / Self Care | Attending: Internal Medicine

## 2016-11-14 DIAGNOSIS — D649 Anemia, unspecified: Secondary | ICD-10-CM

## 2016-11-14 DIAGNOSIS — D500 Iron deficiency anemia secondary to blood loss (chronic): Secondary | ICD-10-CM

## 2016-11-14 DIAGNOSIS — I96 Gangrene, not elsewhere classified: Secondary | ICD-10-CM

## 2016-11-14 LAB — CBC
HEMATOCRIT: 23.5 % — AB (ref 40.0–52.0)
Hemoglobin: 7.1 g/dL — ABNORMAL LOW (ref 13.0–18.0)
MCH: 19.9 pg — ABNORMAL LOW (ref 26.0–34.0)
MCHC: 30 g/dL — ABNORMAL LOW (ref 32.0–36.0)
MCV: 66.2 fL — AB (ref 80.0–100.0)
Platelets: 718 10*3/uL — ABNORMAL HIGH (ref 150–440)
RBC: 3.55 MIL/uL — ABNORMAL LOW (ref 4.40–5.90)
RDW: 29.4 % — AB (ref 11.5–14.5)
WBC: 10.9 10*3/uL — ABNORMAL HIGH (ref 3.8–10.6)

## 2016-11-14 LAB — FOLATE: Folate: 10.6 ng/mL (ref >5.9)

## 2016-11-14 LAB — BUN: BUN: 14 mg/dL (ref 6–20)

## 2016-11-14 LAB — GLUCOSE, CAPILLARY: Glucose-Capillary: 107 mg/dL — ABNORMAL HIGH (ref 65–99)

## 2016-11-14 LAB — ENA+DNA/DS+SJORGEN'S
DS DNA AB: 1 [IU]/mL (ref 0–9)
SSA (Ro) (ENA) Antibody, IgG: 4 AI — ABNORMAL HIGH (ref 0.0–0.9)
SSB (La) (ENA) Antibody, IgG: <0.2 AI (ref 0.0–0.9)

## 2016-11-14 LAB — ANA W/REFLEX: Anti Nuclear Antibody(ANA): POSITIVE — AB

## 2016-11-14 LAB — OCCULT BLOOD X 1 CARD TO LAB, STOOL: Fecal Occult Bld: NEGATIVE

## 2016-11-14 LAB — CREATININE, SERUM
CREATININE: 1.2 mg/dL (ref 0.61–1.24)
GFR calc non Af Amer: 60 mL/min — ABNORMAL LOW (ref >60)

## 2016-11-14 LAB — PATHOLOGIST SMEAR REVIEW

## 2016-11-14 LAB — PREPARE RBC (CROSSMATCH)

## 2016-11-14 SURGERY — ESOPHAGOGASTRODUODENOSCOPY (EGD) WITH PROPOFOL
Anesthesia: General

## 2016-11-14 MED ORDER — LIDOCAINE HCL (PF) 2 % IJ SOLN
INTRAMUSCULAR | Status: AC
  Filled 2016-11-14: qty 10

## 2016-11-14 MED ORDER — MIDAZOLAM HCL 2 MG/2ML IJ SOLN
INTRAMUSCULAR | Status: AC
  Filled 2016-11-14: qty 2

## 2016-11-14 MED ORDER — DEXTROSE 5 % IV SOLN
2.0000 g | Freq: Once | INTRAVENOUS | Status: DC
  Filled 2016-11-14: qty 2000

## 2016-11-14 MED ORDER — SODIUM CHLORIDE 0.9 % IV SOLN: INTRAVENOUS | Status: DC

## 2016-11-14 MED ORDER — GLYCOPYRROLATE 0.2 MG/ML IJ SOLN
INTRAMUSCULAR | Status: AC
  Filled 2016-11-14: qty 3

## 2016-11-14 MED ORDER — PROPOFOL 500 MG/50ML IV EMUL
INTRAVENOUS | Status: AC
  Filled 2016-11-14: qty 50

## 2016-11-14 MED ORDER — SODIUM CHLORIDE 0.9 % IV SOLN: Freq: Once | INTRAVENOUS | Status: DC

## 2016-11-14 MED ORDER — METHYLPREDNISOLONE SODIUM SUCC 125 MG IJ SOLR: 125.0000 mg | INTRAMUSCULAR | Status: DC | PRN

## 2016-11-14 MED ORDER — FAMOTIDINE 20 MG PO TABS: 40.0000 mg | ORAL_TABLET | ORAL | Status: DC | PRN

## 2016-11-14 MED ORDER — ETOMIDATE 2 MG/ML IV SOLN
INTRAVENOUS | Status: DC | PRN
  Administered 2016-11-14: 4 mg via INTRAVENOUS
  Administered 2016-11-14: 6 mg via INTRAVENOUS

## 2016-11-14 MED ORDER — LIDOCAINE HCL (CARDIAC) 20 MG/ML IV SOLN
INTRAVENOUS | Status: DC | PRN
  Administered 2016-11-14: 80 mg via INTRAVENOUS

## 2016-11-14 MED ORDER — ETOMIDATE 2 MG/ML IV SOLN
INTRAVENOUS | Status: AC
  Filled 2016-11-14: qty 10

## 2016-11-14 MED ORDER — GLYCOPYRROLATE 0.2 MG/ML IJ SOLN
INTRAMUSCULAR | Status: DC | PRN
  Administered 2016-11-14: 0.2 mg via INTRAVENOUS

## 2016-11-14 MED ORDER — PROPOFOL 500 MG/50ML IV EMUL
INTRAVENOUS | Status: DC | PRN
  Administered 2016-11-14: 25 ug/kg/min via INTRAVENOUS

## 2016-11-14 MED ORDER — SODIUM CHLORIDE 0.9 % IV SOLN
INTRAVENOUS | Status: DC
  Administered 2016-11-15: 01:00:00 via INTRAVENOUS

## 2016-11-14 MED ORDER — MORPHINE SULFATE (PF) 2 MG/ML IV SOLN
INTRAVENOUS | Status: AC
  Filled 2016-11-14: qty 1

## 2016-11-14 MED ORDER — MORPHINE SULFATE (PF) 2 MG/ML IV SOLN
2.0000 mg | INTRAVENOUS | Status: DC | PRN
  Administered 2016-11-14 – 2016-11-23 (×10): 2 mg via INTRAVENOUS
  Filled 2016-11-14 (×9): qty 1

## 2016-11-14 MED ORDER — SODIUM CHLORIDE 0.9 % IV SOLN
1250.0000 mg | INTRAVENOUS | Status: DC
  Administered 2016-11-14 – 2016-11-19 (×8): 1250 mg via INTRAVENOUS
  Filled 2016-11-14 (×8): qty 1250

## 2016-11-14 MED ORDER — MIDAZOLAM HCL 2 MG/2ML IJ SOLN
INTRAMUSCULAR | Status: DC | PRN
  Administered 2016-11-14 (×2): 1 mg via INTRAVENOUS

## 2016-11-14 MED ORDER — PROPOFOL 10 MG/ML IV BOLUS
INTRAVENOUS | Status: AC
  Filled 2016-11-14: qty 20

## 2016-11-14 NOTE — Op Note (Signed)
[facility] : [facility] Gastroenterology Patient Name: Tristan [name] Procedure Date: 11/14/2016 9:54 AM MRN: [id number] Account #: [id number] Date of Birth: 08-Mar-1945 Admit Type: Inpatient Age: 72 Room: [facility] ENDO ROOM 4 Gender: Male Note Status: Finalized Procedure:            Upper GI endoscopy Indications:          Iron deficiency anemia Providers:            Lucilla Lame MD, MD Referring MD:         No Local Md, MD (Referring MD) Medicines:            Propofol per Anesthesia Complications:        No immediate complications. Procedure:            Pre-Anesthesia Assessment:                       - Prior to the procedure, a History and Physical was                        performed, and patient medications and allergies were                        reviewed. The patient's tolerance of previous                        anesthesia was also reviewed. The risks and benefits of                        the procedure and the sedation options and risks were                        discussed with the patient. All questions were                        answered, and informed consent was obtained. Prior                        Anticoagulants: The patient has taken no previous                        anticoagulant or antiplatelet agents. ASA Grade                        Assessment: II - A patient with mild systemic disease.                        After reviewing the risks and benefits, the patient was                        deemed in satisfactory condition to undergo the                        procedure.                       After obtaining informed consent, the endoscope was                        passed under direct vision. Throughout the procedure,  the patient's blood pressure, pulse, and oxygen                        saturations were monitored continuously. The Endoscope                        was introduced through the mouth, and advanced to the   second part of duodenum. The upper GI endoscopy was                        accomplished without difficulty. The patient tolerated                        the procedure well. Findings:      The examined esophagus was normal.      Diffuse moderate inflammation characterized by erythema was found in the       entire examined stomach.      Segmental moderate mucosal changes characterized by altered texture were       found on the greater curvature of the stomach. Biopsies were taken with       a cold forceps for histology.      The examined duodenum was normal. Impression:           - Normal esophagus.                       - Gastritis.                       - Texture changed mucosa in the greater curvature.                        Biopsied.                       - Normal examined duodenum. Recommendation:       - Await pathology results.                       - Return patient to hospital ward for ongoing care.                       - Resume previous diet.                       - Continue present medications. Procedure Code(s):    --- Professional ---                       [phone], Esophagogastroduodenoscopy, flexible, transoral;                        with biopsy, single or multiple Diagnosis Code(s):    --- Professional ---                       D50.9, Iron deficiency anemia, unspecified                       K31.89, Other diseases of stomach and duodenum                       K29.70, Gastritis, unspecified, without bleeding CPT copyright 2016 American Medical Association. All rights reserved. The codes documented in  this report are preliminary and upon coder review may  be revised to meet current compliance requirements. Lucilla Lame MD, MD 11/14/2016 10:18:37 AM This report has been signed electronically. Number of Addenda: 0 Note Initiated On: 11/14/2016 9:54 AM      [facility]

## 2016-11-14 NOTE — Anesthesia Procedure Notes (Signed)
Date/Time: 11/14/2016 10:01 AM Performed by: Darlyne Russian Pre-anesthesia Checklist: Patient identified, Emergency Drugs available, Suction available, Patient being monitored and Timeout performed Patient Re-evaluated:Patient Re-evaluated prior to induction Oxygen Delivery Method: Nasal cannula Induction Type: IV induction Placement Confirmation: positive ETCO2

## 2016-11-14 NOTE — Progress Notes (Signed)
Patient drank approximately half of bowel prep, stated he "drank all he could."  Patient requesting scissors to clip skin from wound to R leg because the skin catches on things and to dressing when wrapped, and causes worse pain. Educated patient why he should not do that and scissors not provided. C/o 10/10 pain to R leg, MD notified and PRN order for morphine obtained and administered with relief to the patient.

## 2016-11-14 NOTE — Anesthesia Preprocedure Evaluation (Signed)
Anesthesia Evaluation  Patient identified by MRN, date of birth, ID band Patient awake    Reviewed: Allergy & Precautions, NPO status , Patient's Chart, lab work & pertinent test results  History of Anesthesia Complications Negative for: history of anesthetic complications  Airway Mallampati: II       Dental  (+) Poor Dentition, Chipped, Missing   Pulmonary neg sleep apnea, neg COPD, former smoker,           Cardiovascular hypertension, Pt. on medications + Past MI (EF 25%)  (-) dysrhythmias + Valvular Problems/Murmurs AS      Neuro/Psych neg Seizures    GI/Hepatic Neg liver ROS, neg GERD  ,  Endo/Other  neg diabetes  Renal/GU negative Renal ROS     Musculoskeletal   Abdominal   Peds  Hematology  (+) anemia ,   Anesthesia Other Findings   Reproductive/Obstetrics                             Anesthesia Physical Anesthesia Plan  ASA: IV  Anesthesia Plan: General   Post-op Pain Management:    Induction: Intravenous  PONV Risk Score and Plan: Propofol infusion  Airway Management Planned: Nasal Cannula  Additional Equipment:   Intra-op Plan:   Post-operative Plan:   Informed Consent: I have reviewed the patients History and Physical, chart, labs and discussed the procedure including the risks, benefits and alternatives for the proposed anesthesia with the patient or authorized representative who has indicated his/her understanding and acceptance.     Plan Discussed with:   Anesthesia Plan Comments:         Anesthesia Quick Evaluation

## 2016-11-14 NOTE — Progress Notes (Signed)
Pharmacy Antibiotic Note  Tristan [name] is a 71 y.o. male admitted on 11/12/2016 with sepsis.  Pharmacy has been consulted for Vancomycin and Zosyn dosing.  Plan: Vancomycin 1250 mg IV every 24 hours.  Goal trough 15-20 mcg/mL. Zosyn 3.375g IV q8h (4 hour infusion). Will check a trough level prior to 5th dose.   10/29:  Scr improved. Will adjust Vancomycin to 1 gram IV q18h.  Ke 0.045  T1/2 15.4  Vd 55. Check trough 10/31 prior to 5th dose.  10/30 : Scr improved again to 1.20. Will adjust Vancomyin to 1250 mg IV q18h.  Ke 0.052  T1/2 13.3  Vd 49. Will check trough prior to 4th dose instead of 5th dose of this new regimen (since patient started on 10/28 and due to dose changes a trough has not beed checked yet).     Height: 5\' 11"  (180.3 cm) Weight: 157 lb (71.2 kg) IBW/kg (Calculated) : 75.3  Temp (24hrs), Avg:98.6 F (37 C), Min:97.1 F (36.2 C), Max:99.8 F (37.7 C)   Recent Labs Lab 11/12/16 1233 11/13/16 0453 11/14/16 0437  WBC 10.8* 9.8 10.9*  CREATININE 1.84* 1.51* 1.20    Estimated Creatinine Clearance: 57.7 mL/min (by C-G formula based on SCr of 1.2 mg/dL).    No Known Allergies  Antimicrobials this admission: Vancomycin 10/28 >>  Zosyn 10/28 >>    Thank you for allowing pharmacy to be a part of this patient's care.  Chinita Greenland PharmD Clinical Pharmacist 11/14/2016

## 2016-11-14 NOTE — Transfer of Care (Signed)
Immediate Anesthesia Transfer of Care Note  Patient: Tristan [name]  Procedure(s) Performed: ESOPHAGOGASTRODUODENOSCOPY (EGD) WITH PROPOFOL (N/A ) COLONOSCOPY WITH PROPOFOL (N/A )  Patient Location: PACU  Anesthesia Type:General  Level of Consciousness: awake, alert , oriented and patient cooperative  Airway & Oxygen Therapy: Patient Spontanous Breathing and Patient connected to nasal cannula oxygen  Post-op Assessment: Report given to RN and Post -op Vital signs reviewed and stable  Post vital signs: Reviewed and stable  Last Vitals:  Vitals:   11/14/16 0858 11/14/16 1033  BP: 127/69 113/71  Pulse: (!) 110 (!) 110  Resp: 16 (!) 26  Temp: 37.2 C (!) 36.2 C  SpO2: 98% 100%    Last Pain:  Vitals:   11/14/16 1033  TempSrc: Tympanic  PainSc: 0-No pain         Complications: No apparent anesthesia complications

## 2016-11-14 NOTE — Anesthesia Post-op Follow-up Note (Signed)
Anesthesia QCDR form completed.        

## 2016-11-14 NOTE — Progress Notes (Signed)
[place] [facility] [place] NAME: Jawaan Adachi    MR#:  [id number]  DATE OF BIRTH:  06/05/1945  SUBJECTIVE:  CHIEF COMPLAINT:   Chief Complaint  Patient presents with  . Foot Pain  Right foot pain, hemoglobin 7.1, tachycardic REVIEW OF SYSTEMS:  Review of Systems  Constitutional: Negative for chills, fever and weight loss.  HENT: Negative for nosebleeds and sore throat.   Eyes: Negative for blurred vision.  Respiratory: Negative for cough, shortness of breath and wheezing.   Cardiovascular: Negative for chest pain, orthopnea, leg swelling and PND.  Gastrointestinal: Negative for abdominal pain, constipation, diarrhea, heartburn, nausea and vomiting.  Genitourinary: Negative for dysuria and urgency.  Musculoskeletal: Positive for joint pain (rt foot). Negative for back pain.  Skin: Negative for rash.  Neurological: Negative for dizziness, speech change, focal weakness and headaches.  Endo/Heme/Allergies: Does not bruise/bleed easily.  Psychiatric/Behavioral: Negative for depression.   DRUG ALLERGIES:  No Known Allergies VITALS:  Blood pressure (!) 104/57, pulse (!) 117, temperature 99.6 F (37.6 C), temperature source Oral, resp. rate 18, height 5\' 11"  (1.803 m), weight 71.2 kg (157 lb), SpO2 98 %. PHYSICAL EXAMINATION:  Physical Exam  Constitutional: He is oriented to person, place, and time and well-developed, well-nourished, and in no distress.  HENT:  Head: Normocephalic and atraumatic.  Eyes: Pupils are equal, round, and reactive to light. Conjunctivae and EOM are normal.  Neck: Normal range of motion. Neck supple. No tracheal deviation present. No thyromegaly present.  Cardiovascular: Normal rate, regular rhythm and normal heart sounds.   Pulses:      Dorsalis pedis pulses are 0 on the right side.  Pulmonary/Chest: Effort normal and breath sounds normal. No respiratory distress. He has no wheezes. He exhibits no tenderness.    Abdominal: Soft. Bowel sounds are normal. He exhibits no distension. There is no tenderness.  Musculoskeletal: Normal range of motion.       Right ankle: He exhibits swelling and abnormal pulse.  Right leg has a very large ischemic necrotic area of skin from the mid tibia on the anterior aspect of the leg extending across the ankle to the dorsal aspect of the midfoot to forefoot region. Dry necrotic tissue is present. Lymphangitic streak on the anterior mid leg coursing proximally along the anterior mid portion of the leg proximally.  right fifth toe looks gangrenous   Neurological: He is alert and oriented to person, place, and time. No cranial nerve deficit.  Skin: Skin is warm and dry. Rash noted.     Necrotic/gangrenous skin on the right foot area  Psychiatric: Mood and affect normal.   LABORATORY PANEL:  Male CBC  Recent Labs Lab 11/14/16 0437  WBC 10.9*  HGB 7.1*  HCT 23.5*  PLT 718*   ------------------------------------------------------------------------------------------------------------------ Chemistries   Recent Labs Lab 11/13/16 0453 11/13/16 1644 11/14/16 0437  NA 131*  --   --   K 3.3*  --   --   CL 98*  --   --   CO2 24  --   --   GLUCOSE 111*  --   --   BUN 25*  --  14  CREATININE 1.51*  --  1.20  CALCIUM 7.9*  --   --   MG  --  1.8  --   AST 19  --   --   ALT 9*  --   --   ALKPHOS 107  --   --  BILITOT 3.6*  --   --    RADIOLOGY:  No results found. ASSESSMENT AND PLAN:  [name] a 71 y.o.malewith a known history of Hypertension, severe anemia admitted for right foot ulcer with severe cellulitis.    *Sepsis: Present on admission due to right leg infection - hold Coreg due to hypotension  *Severe acute on chronic microcytic anemia: Likely of chronic disease, Stool is guaiac negative.   iron deficiency -s/p 3 units with improvement of hemoglobin from 5.3 to 7.1. will order additional unit today.  Goal hemoglobin is 7.5-8.0. - [facility]. Patient to receive Venofer 200 mg IV x 1 while hospitalized then additional infusions weekly in clinic (ferritin goal 100 if sed rate normal). per Oncology - No current evidence of GI or GU bleeding. EGD and colonoscopy today revealed no active bleeding site. - Follow-up pathology from gastric biopsy (texture change).  * Necrotic gangrenous skin right lower leg/cellulitis - Appreciate podiatry evaluation.  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 Management plans discussed with the patient, nursing and they are in agreement.  CODE STATUS: Full Code  TOTAL TIME TAKING CARE OF THIS PATIENT: 35 minutes.   More than 50% of the time was spent in counseling/coordination of care: YES  POSSIBLE D/C IN 2-3 DAYS, DEPENDING ON CLINICAL CONDITION.  And vascular surgery/podiatry evaluation  Max Sane M.D on 11/14/2016 at 4:36 PM  Between 7am to 6pm - Pager - [phone]  After 6pm go to [email or web address] - Proofreader  [facility]  Office  [phone]  CC: Primary care physician; Patient, No Pcp Per  Note: This dictation was prepared with Dragon dictation along with smaller phrase technology. Any transcriptional errors that result from this process are unintentional.

## 2016-11-14 NOTE — Progress Notes (Signed)
[facility] Hematology/Oncology Progress Note  Date of admission: 11/12/2016  Hospital day:  11/14/2016  Chief Complaint: Tristan [name] is a 71 y.o. male who was admitted with a history of iron deficiency anemia who was admitted with symptomatic anemia and right lower extremity cellulitis.  Subjective:  No new symptoms.  Notes ongoing right lower extremity pain.  Social History: The patient is alone today.  Allergies: No Known Allergies  Scheduled Medications: . allopurinol  100 mg Oral Daily  . docusate sodium  100 mg Oral BID  . heparin  5,000 Units Subcutaneous Q8H  . pantoprazole  40 mg Oral BID AC    Review of Systems: GENERAL: Fatigue. No sweats. Weight up 4 pounds since 05/2016 admission. PERFORMANCE STATUS (ECOG): 1 HEENT: No visual changes, runny nose, sore throat, mouth sores or tenderness. Lungs: Shortness of breath with exertion. No cough. No hemoptysis. Cardiac: Aortic stenosis.  No chest pain, palpitations, orthopnea, or PND. GI: No nausea, vomiting, diarrhea, constipation, melena or hematochezia. GU: No urgency, frequency, or hematuria. Musculoskeletal: No back pain. No joint pain. No muscle tenderness. Extremities: h/o gout. Right lower extremity pain. Skin: No rashes or skin changes. Neuro: No headache, numbness or weakness, balance or coordination issues. Endocrine: No diabetes, thyroid issues, hot flashes or night sweats. Psych: No mood changes, depression or anxiety. Pain: Right lower extremity pain. Review of systems: All other systems reviewed and found to be negative.  Physical Exam: Blood pressure (!) 104/57, pulse (!) 117, temperature 99.6 F (37.6 C), temperature source Oral, resp. rate 18, height _0  (1.803 m), weight 157 lb (71.2 kg), SpO2 98 %.  GENERAL:Thin gentleman lying comfortably on the medical unit in no acute distress. MENTAL STATUS: Alert and oriented to person, place and  time. HEAD:Gray hair. Normocephalic, atraumatic, face symmetric, no Cushingoid features. EYES:Brown eyes. Pupils equal round and reactive to light and accomodation. No conjunctivitis or scleral icterus. TGY:BWLSLHTDSK clear without lesion. Tonguenormal. Poor dentition. Mucous membranes moist. RESPIRATORY:Clear to auscultationwithout rales, wheezes or rhonchi. CARDIOVASCULAR:Regular rate and rhythmwith III/VI murmur.  No rub or gallop. ABDOMEN:Soft, non-tender, with active bowel sounds, and no hepatosplenomegaly. No masses. SKIN: No rashes, ulcers or lesions. EXTREMITIES: Right lower extremity unwrapped.  Large area of black necrotic dry tissue extending from tibia into foot.  Irregular circular area at superior margin deep.  Chronic left lower extremity changes.  No palpable cords. NEUROLOGICAL: Unremarkable. PSYCH: Appropriate.   Results for orders placed or performed during the hospital encounter of 11/12/16 (from the past 48 hour(s))  Prepare RBC     Status: None   Collection Time: 11/12/16  3:30 PM  Result Value Ref Range   Order Confirmation ORDER PROCESSED BY BLOOD BANK   Wound or Superficial Culture     Status: None (Preliminary result)   Collection Time: 11/12/16  3:48 PM  Result Value Ref Range   Specimen Description LEG PER RN ALICIA LEG ULCER    Special Requests NONE    Gram Stain      RARE WBC PRESENT, PREDOMINANTLY PMN MODERATE GRAM NEGATIVE RODS MODERATE GRAM POSITIVE COCCI    Culture      CULTURE REINCUBATED FOR BETTER GROWTH Performed at [facility], [place] [address] [place], [place] [address]    Report Status PENDING   Vitamin B12     Status: None   Collection Time: 11/12/16  4:30 PM  Result Value Ref Range   Vitamin B-12 269 180 - 914 pg/mL    Comment: (  NOTE) This assay is not validated for testing neonatal or myeloproliferative syndrome specimens for Vitamin B12 levels. Performed at [facility], [place] [address]  [place], [place] [address]   Comprehensive metabolic panel     Status: Abnormal   Collection Time: 11/13/16  4:53 AM  Result Value Ref Range   Sodium 131 (L) 135 - 145 mmol/L   Potassium 3.3 (L) 3.5 - 5.1 mmol/L   Chloride 98 (L) 101 - 111 mmol/L   CO2 24 22 - 32 mmol/L   Glucose, Bld 111 (H) 65 - 99 mg/dL   BUN 25 (H) 6 - 20 mg/dL   Creatinine, Ser 1.51 (H) 0.61 - 1.24 mg/dL   Calcium 7.9 (L) 8.9 - 10.3 mg/dL   Total Protein 6.2 (L) 6.5 - 8.1 g/dL   Albumin 2.3 (L) 3.5 - 5.0 g/dL   AST 19 15 - 41 U/L   ALT 9 (L) 17 - 63 U/L   Alkaline Phosphatase 107 38 - 126 U/L   Total Bilirubin 3.6 (H) 0.3 - 1.2 mg/dL   GFR calc non Af Amer 45 (L) >60 mL/min   GFR calc Af Amer 52 (L) >60 mL/min    Comment: (NOTE) The eGFR has been calculated using the CKD EPI equation. This calculation has not been validated in all clinical situations. eGFR's persistently <60 mL/min signify possible Chronic Kidney Disease.    Anion gap 9 5 - 15  CBC     Status: Abnormal   Collection Time: 11/13/16  4:53 AM  Result Value Ref Range   WBC 9.8 3.8 - 10.6 K/uL   RBC 3.17 (L) 4.40 - 5.90 MIL/uL   Hemoglobin 6.2 (L) 13.0 - 18.0 g/dL   HCT 20.0 (L) 40.0 - 52.0 %   MCV 63.1 (L) 80.0 - 100.0 fL   MCH 19.6 (L) 26.0 - 34.0 pg   MCHC 31.1 (L) 32.0 - 36.0 g/dL   RDW 27.3 (H) 11.5 - 14.5 %   Platelets 643 (H) 150 - 440 K/uL  Reticulocytes     Status: Abnormal   Collection Time: 11/13/16  4:53 AM  Result Value Ref Range   Retic Ct Pct 1.8 0.4 - 3.1 %   RBC. 3.17 (L) 4.40 - 5.90 MIL/uL   Retic Count, Absolute 57.1 19.0 - 183.0 K/uL  Lactate dehydrogenase     Status: None   Collection Time: 11/13/16  4:53 AM  Result Value Ref Range   LDH 117 98 - 192 U/L  ANA w/Reflex     Status: Abnormal   Collection Time: 11/13/16  4:53 AM  Result Value Ref Range   Anit Nuclear Antibody(ANA) Positive (A) Negative    Comment: (NOTE) Performed At: [facility] [place], [place] [id number] Lindon Romp  MD [id number]   ENA+DNA/DS+Sjorgen's     Status: Abnormal   Collection Time: 11/13/16  4:53 AM  Result Value Ref Range   ds DNA Ab 1 0 - 9 IU/mL    Comment: (NOTE)                                   Negative      <5                                   Equivocal  5 - 9  Positive      >9    Ribonucleic Protein <0.2 0.0 - 0.9 AI   ENA SM Ab Ser-aCnc <0.2 0.0 - 0.9 AI   SSA (Ro) (ENA) Antibody, IgG 4.0 (H) 0.0 - 0.9 AI   SSB (La) (ENA) Antibody, IgG <0.2 0.0 - 0.9 AI   See below: Comment     Comment: (NOTE) Autoantibody                       Disease Association ------------------------------------------------------------                        Condition                  Frequency ---------------------   ------------------------   --------- Antinuclear Antibody,    SLE, mixed connective Direct (ANA-D)           tissue diseases ---------------------   ------------------------   --------- dsDNA                    SLE                        40 - 60% ---------------------   ------------------------   --------- Chromatin                Drug induced SLE                90%                         SLE                        48 - 97% ---------------------   ------------------------   --------- SSA (Ro)                 SLE                        25 - 35%                         Sjogren's Syndrome         40 - 70%                         Neonatal Lupus                 100% ---------------------   ------------------------   --------- SSB (La)                 SLE                              10%                         Sjogren's Syndrome              30% ---------------------   -----------------------    --------- Sm (anti-Smith)          SLE                        15 - 30% ---------------------   -----------------------    --------- RNP  Mixed Connective Tissue                         Disease                         95% (U1 nRNP,                 SLE                        30 - 50% anti-ribonucleoprotein)  Polymyositis and/or                         Dermatomyositis                 20% ---------------------   ------------------------   --------- Scl-70 (antiDNA          Scleroderma (diffuse)      20 - 35% topoisomerase)           Crest                           13% ---------------------   ------------------------   --------- Jo-1                     Polymyositis and/or                         Dermatomyositis            20 - 40% ---------------------   ------------------------   --------- Centromere B             Scleroderma -  Crest                         variant                         80% Performed At: [facility] [place], [place] [id number] Lindon Romp MD [id number]   Occult blood card to lab, stool     Status: None   Collection Time: 11/13/16  6:13 AM  Result Value Ref Range   Fecal Occult Bld NEGATIVE NEGATIVE  Prepare RBC     Status: None   Collection Time: 11/13/16  6:45 AM  Result Value Ref Range   Order Confirmation ORDER PROCESSED BY BLOOD BANK   Glucose, capillary     Status: Abnormal   Collection Time: 11/13/16  7:53 AM  Result Value Ref Range   Glucose-Capillary 110 (H) 65 - 99 mg/dL   Comment 1 Notify RN   Urinalysis, Complete w Microscopic     Status: Abnormal   Collection Time: 11/13/16  2:18 PM  Result Value Ref Range   Color, Urine AMBER (A) YELLOW    Comment: BIOCHEMICALS MAY BE AFFECTED BY COLOR   APPearance HAZY (A) CLEAR   Specific Gravity, Urine 1.016 1.005 - 1.030   pH 5.0 5.0 - 8.0   Glucose, UA NEGATIVE NEGATIVE mg/dL   Hgb urine dipstick SMALL (A) NEGATIVE   Bilirubin Urine NEGATIVE NEGATIVE   Ketones, ur NEGATIVE NEGATIVE mg/dL   Protein, ur NEGATIVE NEGATIVE mg/dL   Nitrite NEGATIVE NEGATIVE   Leukocytes, UA LARGE (A) NEGATIVE   RBC / HPF 0-5 0 - 5 RBC/hpf   WBC, UA TOO  NUMEROUS TO COUNT 0 - 5 WBC/hpf   Bacteria, UA NONE SEEN NONE SEEN   Squamous  Epithelial / LPF 0-5 (A) NONE SEEN   Mucus PRESENT   Magnesium     Status: None   Collection Time: 11/13/16  4:44 PM  Result Value Ref Range   Magnesium 1.8 1.7 - 2.4 mg/dL  Glucose, capillary     Status: Abnormal   Collection Time: 11/13/16  4:44 PM  Result Value Ref Range   Glucose-Capillary 104 (H) 65 - 99 mg/dL  Hemoglobin and hematocrit, blood     Status: Abnormal   Collection Time: 11/13/16  7:16 PM  Result Value Ref Range   Hemoglobin 7.4 (L) 13.0 - 18.0 g/dL   HCT 24.5 (L) 40.0 - 52.0 %  Folate     Status: None   Collection Time: 11/14/16  4:37 AM  Result Value Ref Range   Folate 10.6 >5.9 ng/mL  CBC     Status: Abnormal   Collection Time: 11/14/16  4:37 AM  Result Value Ref Range   WBC 10.9 (H) 3.8 - 10.6 K/uL   RBC 3.55 (L) 4.40 - 5.90 MIL/uL   Hemoglobin 7.1 (L) 13.0 - 18.0 g/dL   HCT 23.5 (L) 40.0 - 52.0 %   MCV 66.2 (L) 80.0 - 100.0 fL   MCH 19.9 (L) 26.0 - 34.0 pg   MCHC 30.0 (L) 32.0 - 36.0 g/dL   RDW 29.4 (H) 11.5 - 14.5 %   Platelets 718 (H) 150 - 440 K/uL  Pathologist smear review     Status: None   Collection Time: 11/14/16  4:37 AM  Result Value Ref Range   Path Review Peripheral blood smear is reviewed.     Comment: Dimorphic population of RBCs. Unremarkable WBC and platelet morphology. Microcytic hypochromic anemia consistent with iron deficiency anemia and recent RBC transfusion. Secondary thrombocytosis. Leukocytosis due to current right foot cellulitis.  Reviewed by Dellia Nims Rubinas, M.D.   BUN     Status: None   Collection Time: 11/14/16  4:37 AM  Result Value Ref Range   BUN 14 6 - 20 mg/dL  Creatinine, serum     Status: Abnormal   Collection Time: 11/14/16  4:37 AM  Result Value Ref Range   Creatinine, Ser 1.20 0.61 - 1.24 mg/dL   GFR calc non Af Amer 60 (L) >60 mL/min   GFR calc Af Amer >60 >60 mL/min    Comment: (NOTE) The eGFR has been calculated using the CKD EPI equation. This calculation has not been validated in all clinical  situations. eGFR's persistently <60 mL/min signify possible Chronic Kidney Disease.   Prepare RBC     Status: None   Collection Time: 11/14/16  8:00 AM  Result Value Ref Range   Order Confirmation ORDER PROCESSED BY BLOOD BANK   Glucose, capillary     Status: Abnormal   Collection Time: 11/14/16  8:19 AM  Result Value Ref Range   Glucose-Capillary 107 (H) 65 - 99 mg/dL   Dg Tibia/fibula Right  Result Date: 11/12/2016 CLINICAL DATA:  Right leg cellulitis and necrotic soft tissue wound. EXAM: RIGHT TIBIA AND FIBULA - 2 VIEW COMPARISON:  None. FINDINGS: Diffuse soft tissue swelling is seen. No evidence of soft tissue gas. Stent is seen within the right popliteal artery. Peripheral vascular calcification noted. No evidence of osteolysis or periostitis. No evidence of fracture or other focal bone lesion. IMPRESSION: Diffuse soft tissue swelling. No radiographic evidence of osteomyelitis or other osseous  abnormality. Electronically Signed   By: Earle Gell M.D.   On: 11/12/2016 16:40    Assessment:  Tristan [name] is a 71 y.o. male with iron deficiency anemia and reactive thrombocytosis.  Per his history, diet is normal.  However, his albumen is low.  He denies any melena, hematochezia or hematuria.  Work[facility]08[facility]2016 [facility](ferritin 7; iron saturation 7%). Normal labsincluded: B12, folate, TSH, HIV testing, acute hepatitis panel, hepatitis B core antibody, Coom[place], and SPEP. Free light chain ratio was slightly elevated (2.09) of unclear significance. Retic was 1.6% (low).  Peripheral smear revealed no schistocytes.  There was no evidence of hemolysis. Stools were guaiac negative.  He has never had an EGD or colonoscopy.  He has symptomatic anemia c/w ongoing iron deficiency.  Ferritin is 30 (falsely elevated secondary to high sed rate).  Iron saturation is 3% (low).  Retic is 1.8% (inappropriately low for level of  anemia).  LDH is normal.  B12 is 269 (borderline).  Folate is normal.  ANA is +  (SSA/Ro 4.0).  EGD on 11/14/2016 revealed a normal esophagus, gastritis, texture changed mucosa in the greater curvature (biopsied), and normal duodenum.  Colonoscopy on 11/14/2016 revealed diverticulosis in the sigmoid colon and in the descending colon.  He has a right lower extremity gangrene.  He is on vancomycin and Zosyn.  Sed rate is 107.  Plan:   1.  Hematology:  Iron deficiency anemia.  Patient has received 3 units with improvement of hemoglobin from 5.3 to 7.1.  Suspect patient dry on admission (BUN 31 and Cr 1.84) and initial hemoglobin may have been lower.  He is scheduled to receive an additional unit.  Goal hemoglobin is 7.5-8.0.  Patient unclear if he has been taking oral iron.  Patient did not follow-up in our clinic for IV iron.  Discuss the plan for IV iron weekly.  Patient to receive Venofer 200 mg IV x 1 while hospitalized then additional infusions weekly in clinic (ferritin goal 100 if sed rate normal).   Patient states diet is good, although albumen is low.  No current evidence of GI or GU bleeding.  Guaiac all stools.  Urinalysis reveals a small amount of hemoglobin and 0-5 RBCs/HPF.  EGD and colonoscopy today revealed no active bleeding site.  Suspect thrombocytosis will improve with resolution of iron deficiency anemia and management of infection.  Labs pending:  SPEP.  MMA when creatinine has improved to document whether patient has B12 deficiency.  Follow CBC daily.  Maintain active type and screen.   2.  Gastroenterology:  No evidence of bleeding.  Upper and lower endoscopy revealed no source of bleeding today.  Follow-up pathology from gastric biopsy (texture change).  3.  Vascular:  Gangrene of the right lower extremity.  Patient scheduled for lower extremity angiogram tomorrow.   Lequita Asal, MD  11/14/2016, 2:50 PM

## 2016-11-14 NOTE — Op Note (Signed)
[facility] Gastroenterology Patient Name: Tristan [name] Procedure Date: 11/14/2016 9:54 AM MRN: [id number] Account #: [id number] Date of Birth: 09-01-1945 Admit Type: Inpatient Age: 71 Room: [facility] ENDO ROOM 4 Gender: Male Note Status: Finalized Procedure:            Colonoscopy Indications:          Iron deficiency anemia Providers:            Lucilla Lame MD, MD Referring MD:         No Local Md, MD (Referring MD) Medicines:            Propofol per Anesthesia Complications:        No immediate complications. Procedure:            Pre-Anesthesia Assessment:                       - Prior to the procedure, a History and Physical was                        performed, and patient medications and allergies were                        reviewed. The patient's tolerance of previous                        anesthesia was also reviewed. The risks and benefits of                        the procedure and the sedation options and risks were                        discussed with the patient. All questions were                        answered, and informed consent was obtained. Prior                        Anticoagulants: The patient has taken no previous                        anticoagulant or antiplatelet agents. ASA Grade                        Assessment: II - A patient with mild systemic disease.                        After reviewing the risks and benefits, the patient was                        deemed in satisfactory condition to undergo the                        procedure.                       After obtaining informed consent, the colonoscope was                        passed under direct vision. Throughout the procedure,  the patient's blood pressure, pulse, and oxygen                        saturations were monitored continuously. The                        Colonoscope was introduced through the anus and                        advanced to the the  cecum, identified by appendiceal                        orifice and ileocecal valve. The colonoscopy was                        performed without difficulty. The patient tolerated the                        procedure well. The quality of the bowel preparation                        was fair. Findings:      The perianal and digital rectal examinations were normal.      Multiple small-mouthed diverticula were found in the sigmoid colon and       descending colon. Impression:           - Preparation of the colon was fair.                       - Diverticulosis in the sigmoid colon and in the                        descending colon.                       - No specimens collected. Recommendation:       - Return patient to hospital ward for ongoing care.                       - Resume previous diet. Procedure Code(s):    --- Professional ---                       [phone], Colonoscopy, flexible; diagnostic, including                        collection of specimen(s) by brushing or washing, when                        performed (separate procedure) Diagnosis Code(s):    --- Professional ---                       D50.9, Iron deficiency anemia, unspecified CPT copyright 2016 American Medical Association. All rights reserved. The codes documented in this report are preliminary and upon coder review may  be revised to meet current compliance requirements. Lucilla Lame MD, MD 11/14/2016 10:30:18 AM This report has been signed electronically. Number of Addenda: 0 Note Initiated On: 11/14/2016 9:54 AM Scope Withdrawal Time: 0 hours 4 minutes 26 seconds  Total Procedure Duration: 0 hours 8 minutes 50 seconds       [facility]

## 2016-11-14 NOTE — Anesthesia Postprocedure Evaluation (Signed)
Anesthesia Post Note  Patient: Tristan [name]  Procedure(s) Performed: ESOPHAGOGASTRODUODENOSCOPY (EGD) WITH PROPOFOL (N/A ) COLONOSCOPY WITH PROPOFOL (N/A )  Patient location during evaluation: Endoscopy Anesthesia Type: General Level of consciousness: awake and alert Pain management: pain level controlled Vital Signs Assessment: post-procedure vital signs reviewed and stable Respiratory status: spontaneous breathing and respiratory function stable Cardiovascular status: stable Anesthetic complications: no     Last Vitals:  Vitals:   11/14/16 0858 11/14/16 1033  BP: 127/69 113/71  Pulse: (!) 110 (!) 110  Resp: 16 (!) 26  Temp: 37.2 C (!) 36.2 C  SpO2: 98% 100%    Last Pain:  Vitals:   11/14/16 1033  TempSrc: Tympanic  PainSc: 0-No pain                 KEPHART,WILLIAM K

## 2016-11-15 ENCOUNTER — Encounter
Admission: EM | Disposition: A | Discharge status: Skilled Nursing Facility | Payer: Self-pay | Source: Home / Self Care | Attending: Internal Medicine

## 2016-11-15 DIAGNOSIS — I70262 Atherosclerosis of native arteries of extremities with gangrene, left leg: Secondary | ICD-10-CM

## 2016-11-15 HISTORY — PX: LOWER EXTREMITY ANGIOGRAPHY: CATH118251

## 2016-11-15 LAB — BASIC METABOLIC PANEL
ANION GAP: 8 (ref 5–15)
BUN: 9 mg/dL (ref 6–20)
CALCIUM: 8 mg/dL — AB (ref 8.9–10.3)
CO2: 24 mmol/L (ref 22–32)
Chloride: 102 mmol/L (ref 101–111)
Creatinine, Ser: 1.11 mg/dL (ref 0.61–1.24)
GFR calc Af Amer: >60 mL/min (ref >60)
GFR calc non Af Amer: >60 mL/min (ref >60)
GLUCOSE: 94 mg/dL (ref 65–99)
Potassium: 3.7 mmol/L (ref 3.5–5.1)
Sodium: 134 mmol/L — ABNORMAL LOW (ref 135–145)

## 2016-11-15 LAB — MULTIPLE MYELOMA PANEL, SERUM
Albumin SerPl Elph-Mcnc: 2.3 g/dL — ABNORMAL LOW (ref 2.9–4.4)
Albumin/Glob SerPl: 0.7 (ref 0.7–1.7)
Alpha 1: 0.4 g/dL (ref 0.0–0.4)
Alpha2 Glob SerPl Elph-Mcnc: 0.9 g/dL (ref 0.4–1.0)
B-Globulin SerPl Elph-Mcnc: 0.8 g/dL (ref 0.7–1.3)
Gamma Glob SerPl Elph-Mcnc: 1.3 g/dL (ref 0.4–1.8)
Globulin, Total: 3.4 g/dL (ref 2.2–3.9)
IgA: 266 mg/dL (ref 61–437)
IgG (Immunoglobin G), Serum: 1307 mg/dL (ref 700–1600)
IgM (Immunoglobulin M), Srm: 101 mg/dL (ref 20–172)
Total Protein ELP: 5.7 g/dL — ABNORMAL LOW (ref 6.0–8.5)

## 2016-11-15 LAB — TYPE AND SCREEN
ABO/RH(D): B POS
Antibody Screen: NEGATIVE
Unit division: 0
Unit division: 0
Unit division: 0
Unit division: 0

## 2016-11-15 LAB — BPAM RBC
Blood Product Expiration Date: 201811082359
Blood Product Expiration Date: 201811082359
Blood Product Expiration Date: 201811132359
Blood Product Expiration Date: 201811162359
ISSUE DATE / TIME: 201810281813
ISSUE DATE / TIME: 201810290127
ISSUE DATE / TIME: 201810290858
ISSUE DATE / TIME: 201810301349
Unit Type and Rh: 1700
Unit Type and Rh: 7300
Unit Type and Rh: 7300
Unit Type and Rh: 7300

## 2016-11-15 LAB — CBC
HCT: 27.1 % — ABNORMAL LOW (ref 40.0–52.0)
Hemoglobin: 8.2 g/dL — ABNORMAL LOW (ref 13.0–18.0)
MCH: 20.8 pg — AB (ref 26.0–34.0)
MCHC: 30.1 g/dL — ABNORMAL LOW (ref 32.0–36.0)
MCV: 69.2 fL — AB (ref 80.0–100.0)
PLATELETS: 726 10*3/uL — AB (ref 150–440)
RBC: 3.92 MIL/uL — AB (ref 4.40–5.90)
RDW: 29.3 % — ABNORMAL HIGH (ref 11.5–14.5)
WBC: 10.4 10*3/uL (ref 3.8–10.6)

## 2016-11-15 LAB — AEROBIC CULTURE  (SUPERFICIAL SPECIMEN)

## 2016-11-15 LAB — DAT, POLYSPECIFIC AHG (ARMC ONLY): Polyspecific AHG test: NEGATIVE

## 2016-11-15 LAB — GLUCOSE, CAPILLARY
GLUCOSE-CAPILLARY: 109 mg/dL — AB (ref 65–99)
Glucose-Capillary: 104 mg/dL — ABNORMAL HIGH (ref 65–99)
Glucose-Capillary: 85 mg/dL (ref 65–99)

## 2016-11-15 LAB — AEROBIC CULTURE W GRAM STAIN (SUPERFICIAL SPECIMEN)

## 2016-11-15 SURGERY — LOWER EXTREMITY ANGIOGRAPHY
Anesthesia: Moderate Sedation | Laterality: Right

## 2016-11-15 MED ORDER — METOPROLOL TARTRATE 5 MG/5ML IV SOLN
INTRAVENOUS | Status: AC
  Filled 2016-11-15: qty 5

## 2016-11-15 MED ORDER — FENTANYL CITRATE (PF) 100 MCG/2ML IJ SOLN
INTRAMUSCULAR | Status: AC
  Filled 2016-11-15: qty 2

## 2016-11-15 MED ORDER — TIROFIBAN HCL IN NACL 5-0.9 MG/100ML-% IV SOLN
0.1500 ug/kg/min | INTRAVENOUS | Status: AC
  Administered 2016-11-16: 0.15 ug/kg/min via INTRAVENOUS
  Filled 2016-11-15 (×3): qty 100

## 2016-11-15 MED ORDER — NITROGLYCERIN 1 MG/10 ML FOR IR/CATH LAB
INTRA_ARTERIAL | Status: DC | PRN
  Administered 2016-11-15: 250 ug

## 2016-11-15 MED ORDER — ONDANSETRON HCL 4 MG/2ML IJ SOLN: 4.0000 mg | Freq: Four times a day (QID) | INTRAMUSCULAR | Status: DC | PRN

## 2016-11-15 MED ORDER — HEPARIN SODIUM (PORCINE) 1000 UNIT/ML IJ SOLN
INTRAMUSCULAR | Status: DC | PRN
  Administered 2016-11-15: 5000 [IU] via INTRAVENOUS
  Administered 2016-11-15: 2000 [IU] via INTRAVENOUS

## 2016-11-15 MED ORDER — HYDROMORPHONE HCL 1 MG/ML IJ SOLN
INTRAMUSCULAR | Status: AC
  Filled 2016-11-15: qty 0.5

## 2016-11-15 MED ORDER — HEPARIN (PORCINE) IN NACL 2-0.9 UNIT/ML-% IJ SOLN
INTRAMUSCULAR | Status: AC
  Filled 2016-11-15: qty 1000

## 2016-11-15 MED ORDER — NITROGLYCERIN 5 MG/ML IV SOLN
INTRAVENOUS | Status: AC
  Filled 2016-11-15: qty 10

## 2016-11-15 MED ORDER — DIPHENHYDRAMINE HCL 50 MG/ML IJ SOLN
INTRAMUSCULAR | Status: AC
  Filled 2016-11-15: qty 1

## 2016-11-15 MED ORDER — MIDAZOLAM HCL 5 MG/5ML IJ SOLN
INTRAMUSCULAR | Status: AC
  Filled 2016-11-15: qty 5

## 2016-11-15 MED ORDER — LABETALOL HCL 5 MG/ML IV SOLN
INTRAVENOUS | Status: AC
  Filled 2016-11-15: qty 4

## 2016-11-15 MED ORDER — LABETALOL HCL 5 MG/ML IV SOLN
INTRAVENOUS | Status: DC | PRN
  Administered 2016-11-15: 10 mg via INTRAVENOUS

## 2016-11-15 MED ORDER — LIDOCAINE-EPINEPHRINE (PF) 1 %-1:200000 IJ SOLN
INTRAMUSCULAR | Status: AC
  Filled 2016-11-15: qty 30

## 2016-11-15 MED ORDER — TIROFIBAN HCL IV 12.5 MG/250 ML
INTRAVENOUS | Status: AC
  Filled 2016-11-15: qty 250

## 2016-11-15 MED ORDER — IOPAMIDOL (ISOVUE-300) INJECTION 61%
INTRAVENOUS | Status: DC | PRN
  Administered 2016-11-15: 75 mL via INTRAVENOUS

## 2016-11-15 MED ORDER — TIROFIBAN (AGGRASTAT) BOLUS VIA INFUSION
25.0000 ug/kg | Freq: Once | INTRAVENOUS | Status: AC
  Administered 2016-11-15: 1780 ug via INTRAVENOUS
  Filled 2016-11-15: qty 36

## 2016-11-15 MED ORDER — HYDROMORPHONE HCL 1 MG/ML IJ SOLN
1.0000 mg | Freq: Once | INTRAMUSCULAR | Status: AC | PRN
  Administered 2016-11-15: 0.5 mg via INTRAVENOUS

## 2016-11-15 MED ORDER — FENTANYL CITRATE (PF) 100 MCG/2ML IJ SOLN
INTRAMUSCULAR | Status: DC | PRN
  Administered 2016-11-15 (×2): 50 ug via INTRAVENOUS
  Administered 2016-11-15: 25 ug via INTRAVENOUS
  Administered 2016-11-15 (×2): 50 ug via INTRAVENOUS

## 2016-11-15 MED ORDER — HYDROMORPHONE HCL 1 MG/ML IJ SOLN: 1.0000 mg | Freq: Once | INTRAMUSCULAR | Status: DC

## 2016-11-15 MED ORDER — METOPROLOL TARTRATE 5 MG/5ML IV SOLN
INTRAVENOUS | Status: DC | PRN
  Administered 2016-11-15: 5 mg via INTRAVENOUS

## 2016-11-15 MED ORDER — HEPARIN SODIUM (PORCINE) 1000 UNIT/ML IJ SOLN
INTRAMUSCULAR | Status: AC
  Filled 2016-11-15: qty 1

## 2016-11-15 MED ORDER — SODIUM CHLORIDE 0.9 % IJ SOLN
INTRAMUSCULAR | Status: AC
  Filled 2016-11-15: qty 50

## 2016-11-15 MED ORDER — MIDAZOLAM HCL 2 MG/2ML IJ SOLN
INTRAMUSCULAR | Status: DC | PRN
  Administered 2016-11-15: 1 mg via INTRAVENOUS
  Administered 2016-11-15: 2 mg via INTRAVENOUS
  Administered 2016-11-15: 1 mg via INTRAVENOUS

## 2016-11-15 MED ORDER — CARVEDILOL 6.25 MG PO TABS
6.2500 mg | ORAL_TABLET | Freq: Two times a day (BID) | ORAL | Status: DC
  Administered 2016-11-15 – 2016-11-16 (×2): 6.25 mg via ORAL
  Filled 2016-11-15 (×3): qty 1

## 2016-11-15 MED ORDER — TIROFIBAN (AGGRASTAT) BOLUS VIA INFUSION
25.0000 ug/kg | Freq: Once | INTRAVENOUS | Status: DC
  Filled 2016-11-15: qty 36

## 2016-11-15 MED ORDER — TIROFIBAN HCL IN NACL 5-0.9 MG/100ML-% IV SOLN
0.1500 ug/kg/min | INTRAVENOUS | Status: AC
  Filled 2016-11-15 (×3): qty 100

## 2016-11-15 MED ORDER — DIPHENHYDRAMINE HCL 50 MG/ML IJ SOLN
INTRAMUSCULAR | Status: DC | PRN
  Administered 2016-11-15: 25 mg via INTRAVENOUS

## 2016-11-15 SURGICAL SUPPLY — 36 items
BALLN ARMADA 2X120X150 (BALLOONS) ×3
BALLN ARMADA 5.0X120X150 (BALLOONS) ×3
BALLN DORADO 5X200X135 (BALLOONS) ×3
BALLN DORADO 6X200X135 (BALLOONS) ×3
BALLN LUTONIX 4X150X130 (BALLOONS) ×3
BALLN ULTRVRSE 3.5X100X150 (BALLOONS) ×3
BALLN ULTRVRSE 3X300X150 (BALLOONS) ×2
BALLN ULTRVRSE 3X300X150 OTW (BALLOONS) ×1
BALLOON ARMADA 2X120X150 (BALLOONS) ×1 IMPLANT
BALLOON ARMADA 5.0X120X150 (BALLOONS) ×1 IMPLANT
BALLOON DORADO 5X200X135 (BALLOONS) ×1 IMPLANT
BALLOON DORADO 6X200X135 (BALLOONS) ×1 IMPLANT
BALLOON LUTONIX 4X150X130 (BALLOONS) ×1 IMPLANT
BALLOON ULTRVRSE 3.5X100X150 (BALLOONS) ×1 IMPLANT
BALLOON ULTRVRSE 3X300X150 OTW (BALLOONS) ×1 IMPLANT
CATH BEACON 5 .035 65 RIM TIP (CATHETERS) ×3 IMPLANT
CATH BEACON 5 .038 100 VERT TP (CATHETERS) ×3 IMPLANT
CATH CXI SUPP ANG 2.6FR 150CM (MICROCATHETER) ×3 IMPLANT
CATH CXI SUPP ANG 4FR 135 (MICROCATHETER) ×1 IMPLANT
CATH CXI SUPP ANG 4FR 135CM (MICROCATHETER) ×3
CATH PIG 70CM (CATHETERS) ×3 IMPLANT
DEVICE PRESTO INFLATION (MISCELLANEOUS) ×3 IMPLANT
DEVICE SAFEGUARD 24CM (GAUZE/BANDAGES/DRESSINGS) ×3 IMPLANT
DEVICE STARCLOSE SE CLOSURE (Vascular Products) ×3 IMPLANT
DEVICE TORQUE (MISCELLANEOUS) ×3 IMPLANT
GLIDEWIRE ADV .035X180CM (WIRE) ×3 IMPLANT
PACK ANGIOGRAPHY (CUSTOM PROCEDURE TRAY) ×3 IMPLANT
SHEATH ANL2 6FRX45 HC (SHEATH) ×3 IMPLANT
SHEATH BRITE TIP 5FRX11 (SHEATH) ×3 IMPLANT
STENT VIABAHN 5X250X120 (Permanent Stent) ×3 IMPLANT
STENT VIABAHN 6X250X120 (Permanent Stent) ×3 IMPLANT
SYR MEDRAD MARK V 150ML (SYRINGE) ×3 IMPLANT
TUBING CONTRAST HIGH PRESS 72 (TUBING) ×3 IMPLANT
WIRE G V18X300CM (WIRE) ×3 IMPLANT
WIRE J 3MM .035X145CM (WIRE) ×3 IMPLANT
WIRE MAGIC TORQUE 260C (WIRE) ×3 IMPLANT

## 2016-11-15 NOTE — H&P (Signed)
[facility] History & Physical Update  The patient was interviewed and re-examined.  The patient's previous History and Physical has been reviewed and is unchanged.  There is no change in the plan of care. We plan to proceed with the scheduled procedure.  Leotis Pain, MD  11/15/2016, 12:25 PM

## 2016-11-15 NOTE — Progress Notes (Signed)
[facility] NAME: Tristan [name]    MR#:  [id number]  DATE OF BIRTH:  1945/08/25  SUBJECTIVE:  CHIEF COMPLAINT:   Chief Complaint  Patient presents with  . Foot Pain  No complaints, tachycardic, hemoglobin 8.2, waiting for angiogram today REVIEW OF SYSTEMS:  Review of Systems  Constitutional: Negative for chills, fever and weight loss.  HENT: Negative for nosebleeds and sore throat.   Eyes: Negative for blurred vision.  Respiratory: Negative for cough, shortness of breath and wheezing.   Cardiovascular: Negative for chest pain, orthopnea, leg swelling and PND.  Gastrointestinal: Negative for abdominal pain, constipation, diarrhea, heartburn, nausea and vomiting.  Genitourinary: Negative for dysuria and urgency.  Musculoskeletal: Positive for joint pain (rt foot). Negative for back pain.  Skin: Negative for rash.  Neurological: Negative for dizziness, speech change, focal weakness and headaches.  Endo/Heme/Allergies: Does not bruise/bleed easily.  Psychiatric/Behavioral: Negative for depression.   DRUG ALLERGIES:  No Known Allergies VITALS:  Blood pressure 129/81, pulse (!) 118, temperature 98.2 F (36.8 C), resp. rate 19, height 5\' 11"  (1.803 m), weight 71.2 kg (157 lb), SpO2 98 %. PHYSICAL EXAMINATION:  Physical Exam  Constitutional: He is oriented to person, place, and time and well-developed, well-nourished, and in no distress.  HENT:  Head: Normocephalic and atraumatic.  Eyes: Pupils are equal, round, and reactive to light. Conjunctivae and EOM are normal.  Neck: Normal range of motion. Neck supple. No tracheal deviation present. No thyromegaly present.  Cardiovascular: Normal rate, regular rhythm and normal heart sounds.   Pulses:      Dorsalis pedis pulses are 0 on the right side.  Pulmonary/Chest: Effort normal and breath sounds normal. No respiratory distress. He has no wheezes. He exhibits no tenderness.    Abdominal: Soft. Bowel sounds are normal. He exhibits no distension. There is no tenderness.  Musculoskeletal: Normal range of motion.       Right ankle: He exhibits swelling and abnormal pulse.  Right leg has a very large ischemic necrotic area of skin from the mid tibia on the anterior aspect of the leg extending across the ankle to the dorsal aspect of the midfoot to forefoot region. Dry necrotic tissue is present. Lymphangitic streak on the anterior mid leg coursing proximally along the anterior mid portion of the leg proximally.  right fifth toe looks gangrenous   Neurological: He is alert and oriented to person, place, and time. No cranial nerve deficit.  Skin: Skin is warm and dry. Rash noted.     Necrotic/gangrenous skin on the right foot area  Psychiatric: Mood and affect normal.   LABORATORY PANEL:  Male CBC  Recent Labs Lab 11/15/16 0424  WBC 10.4  HGB 8.2*  HCT 27.1*  PLT 726*   ------------------------------------------------------------------------------------------------------------------ Chemistries   Recent Labs Lab 11/13/16 0453 11/13/16 1644  11/15/16 0424  NA 131*  --   --  134*  K 3.3*  --   --  3.7  CL 98*  --   --  102  CO2 24  --   --  24  GLUCOSE 111*  --   --  94  BUN 25*  --   < > 9  CREATININE 1.51*  --   < > 1.11  CALCIUM 7.9*  --   --  8.0*  MG  --  1.8  --   --   AST 19  --   --   --  ALT 9*  --   --   --   ALKPHOS 107  --   --   --   BILITOT 3.6*  --   --   --   < > = values in this interval not displayed. RADIOLOGY:  No results found. ASSESSMENT AND PLAN:  [name] a 71 y.o.malewith a known history of Hypertension, severe anemia admitted for right foot ulcer with severe cellulitis.   *Sepsis: Present on admission due to right leg infection - hold Coreg due to hypotension -This can be resumed post procedure if he remains tachycardic and if blood pressure can tolerate  * PAD with gangrene : Angiogram today -PTCA and  stenting by Dr. Lucky Cowboy  *Severe acute on chronic microcytic anemia: Likely of chronic disease, Stool is guaiac negative.   iron deficiency -s/p 3 units with improvement of hemoglobin from 5.3 to 7.1. will order additional unit today.  Goal hemoglobin is 7.5-8.0. -IV iron per oncology - No current evidence of GI or GU bleeding. EGD and colonoscopy on 10/30 revealed no active bleeding site.  * Necrotic gangrenous skin right lower leg/cellulitis - Appreciate podiatry evaluation.  vascular surgery is planning for angiogram today - Continue IV vancomycin and Zosyn  * Acute renal failure appears suspected pre-renal azotemia -recievedIV fluids, continue monitoring, avoid nephrotoxic agents  * Severe cardiomyopathy EF of 25% and severe ASnoted on recent echo - followed by Cardiology Dr Humphrey Rolls  - Due to renal failure and severe anemia no catheterization was performed on last admission      All the records are reviewed and case discussed with Care Management/Social Worker. Management plans discussed with the patient, nursing and they are in agreement.  CODE STATUS: Full Code  TOTAL TIME TAKING CARE OF THIS PATIENT: 35 minutes.   More than 50% of the time was spent in counseling/coordination of care: YES  POSSIBLE D/C IN 1-2 DAYS, DEPENDING ON CLINICAL CONDITION.  And vascular surgery/podiatry evaluation  Max Sane M.D on 11/15/2016 at 4:28 PM  Between 7am to 6pm - Pager - [phone]  After 6pm go to [email or web address] - Proofreader  [facility]  Office  [phone]  CC: Primary care physician; Patient, No Pcp Per  Note: This dictation was prepared with Dragon dictation along with smaller phrase technology. Any transcriptional errors that result from this process are unintentional.

## 2016-11-15 NOTE — Op Note (Addendum)
[facility] Percutaneous Study/Intervention Procedural Note   Date of Surgery: 11/15/2016  Surgeon(s):Nehemiah Montee   Assistants:none  Pre-operative Diagnosis: PAD with gangrene right leg  Post-operative diagnosis: Same  Procedure(s) Performed: 1. Ultrasound guidance for vascular access left femoral artery 2. Catheter placement into right anterior tibial artery from left femoral approach 3. Aortogram and selective right lower extremity angiogram 4. Percutaneous transluminal angioplasty of right anterior tibial artery with 2 and 3 mm diameter angioplasty balloons as well as a 4 mm diameter Lutonix drug-coated angioplasty balloon proximally 5. Percutaneous transluminal angioplasty of the right SFA and popliteal arteries with 3 mm diameter and 5 mm diameter angioplasty balloons  6.  Viabahn stent placement to the right SFA and proximal popliteal artery with a 6 mm diameter by 25 cm length covered stent 7. Viabahn stent placement to the right anterior tibial artery and popliteal artery with 5 mm diameter by 25 cm length covered stent   8.  StarClose closure device left femoral artery  EBL: 5 cc  Contrast: 75 cc  Fluoro Time: 24.1 minutes  Moderate Conscious Sedation Time: approximately 110 minutes using 4 mg of Versed and 225 Mcg of Fentanyl and 0.5 mg of Dilaudid  Indications: Patient is a 71 y.o.male with necrotic ulceration on the right lower leg and no distal pulses.  He has a previous history of intervention but did not return up evaluations. The patient is brought in for angiography for further evaluation and potential treatment. Risks and benefits are discussed and informed consent is obtained  Procedure: The patient was identified and appropriate procedural time out was performed. The patient was then placed supine on the table and prepped and draped in the usual  sterile fashion.Moderate conscious sedation was administered during a face to face encounter with the patient throughout the procedure with my supervision of the RN administering medicines and monitoring the patient's vital signs, pulse oximetry, telemetry and mental status throughout from the start of the procedure until the patient was taken to the recovery room. Ultrasound was used to evaluate the left common femoral artery. It was patent . A digital ultrasound image was acquired. A Seldinger needle was used to access the left common femoral artery under direct ultrasound guidance and a permanent image was performed. A 0.035 J wire was advanced without resistance and a 5Fr sheath was placed. Pigtail catheter was placed into the aorta and an AP aortogram was performed. This demonstrated normal renal arteries and normal aorta and iliac segments without significant stenosis. I then crossed the aortic bifurcation and advanced to the right femoral head. Selective right lower extremity angiogram was then performed. This demonstrated occlusion of the SFA several centimeters above the previously placed stent with very poor distal reconstitution with only a distal anterior tibial artery and peroneal artery seen on delayed imaging. The patient was systemically heparinized and a 6 [place] Ansell sheath was then placed over the [employer] wire. I then used a Kumpe catheter and the advantage wire to get down into the SFA occlusion.  Despite multiple attempts and passes, I was never able to reenter the previously placed stent in the SFA.  After trying this for an extended period of time, I elected to try to go around the stent in a subintimal plane and create a new channel in hopes of getting into 1 of the tibial vessels distally.  We were able to do this.  With the CXI catheter and the advantage wire was able SFA and popliteal occlusion  and get down into what was a diseased anterior tibial artery.  This was  occluded proximally with multiple areas of greater than 70% stenosis in the proximal and mid segments until it reconstituted distally in the lower leg and went into the foot.  I advanced with a CXI catheter and the 0.018 wire all the way into the foot confirming intraluminal flow.  There was a tremendous amount of stored energy particularly around the previously placed stent and I started by using small balloons to predilate the SFA and popliteal artery and treat the anterior tibial artery.  The first balloon was a 2 mm diameter by 15 cm length angioplasty balloon was used to treat from the mid to distal anterior tibial artery up through the popliteal artery and superficial femoral each inflation was to burst pressure for approximately 30 seconds.  I then upsized to a 3 mm diameter by 30 cm length angioplasty balloon and treated the proximal and mid anterior tibial artery back into the popliteal artery.  This inflation was to 10 atm for 1 minute.  The balloon was then pulled back into the popliteal artery and SFA to predilate this area to get a larger balloon in.  This was taken to burst inflation for 30 seconds up to the proximal SFA.  Angiogram following this showed continued lack of flow.  A 5 mm diameter by 20 cm length high-pressure angioplasty balloon was then selected and I was able to get into the popliteal artery at the level of the knee and treat the popliteal and distal SFA.  This was inflated to 18 atm for 1 minute.  It was then pulled back into the proximal and mid SFA and a second inflation to about 16-18 atm for 1 minute was performed minimal flow was still seen.  I then took a 6 mm diameter by 25 cm stent and started this about 5 or 6 cm above the previously placed stent around the stent in the SFA and down proximal popliteal artery near Hunter's canal.  This was then postdilated with a 5 and a 6 mm diameter high pressure angioplasty balloon with only mild constraint of about 20% residual after this.   In the popliteal artery and proximal anterior tibial artery there high-grade residual stenosis particularly at the origin of the anterior tibial artery.  I then took a 4 mm diameter by 15 cm length Lutonix drug-coated angioplasty balloon was treated from the proximal anterior tibial artery up through the popliteal artery.  Second inflation was required to get back to the stent at [facility] canal.  Completion angiogram showed continued high-grade stenosis at the origin of the anterior tibial artery and about 4-5 cm beyond the anterior tibial artery origin was another greater than 70% stenosis within the anterior tibial as well as thrombus within the proximal anterior tibial artery and the popliteal artery remains somewhat constrained as well.  At this point, and a clearly limb threatening situation with limited options I elected to place a covered stent.  A 5 mm diameter by 25 cm length Viabahn stent was deployed about 8-10 cm into the anterior tibial artery and taken back up to the previously placed stent that it terminated just past Hunter's canal.  This was postdilated with a 5 mm balloon in the popliteal and distal SFA and a 3.5 mm diameter angioplasty balloon in the anterior tibial artery.  At this point, there was in-line flow 20% residual stenosis in the artery although it was somewhat irregular throughout the  remainder of its course and diseased in the foot.  I felt at this point we would place the patient on Aggrastat overnight and he would likely require intensive medical therapy to help keep these disadvantage interventions open in hopes of wound healing.  There did not appear to be anything further we can do to improve perfusion from an endovascular standpoint.  I elected to terminate the procedure. The sheath was removed and StarClose closure device was deployed in the left femoral artery with excellent hemostatic result. The patient was taken to the recovery room in stable condition having tolerated the  procedure well.  Findings:  Aortogram:Renal arteries appeared normal.  The aorta and iliac segments were somewhat calcific but not stenotic Right Lower Extremity:Occlusion of the SFA several centimeters above the previously placed stent with very poor distal reconstitution with only a distal anterior tibial artery and peroneal artery seen on delayed imaging   Disposition: Patient was taken to the recovery room in stable condition having tolerated the procedure well.  Complications: None  Leotis Pain 11/15/2016 2:48 PM   This note was created with Dragon Medical transcription system. Any errors in dictation are purely unintentional.

## 2016-11-15 NOTE — Progress Notes (Signed)
Dr. Lucky Cowboy came to recovery area to speak with pt. Re: results of PTA/stent to right leg. Pt. Nodded understanding of MD.

## 2016-11-16 ENCOUNTER — Encounter: Payer: Self-pay | Admitting: Vascular Surgery

## 2016-11-16 LAB — CBC
HEMATOCRIT: 26.2 % — AB (ref 40.0–52.0)
Hemoglobin: 8 g/dL — ABNORMAL LOW (ref 13.0–18.0)
MCH: 21.2 pg — AB (ref 26.0–34.0)
MCHC: 30.5 g/dL — AB (ref 32.0–36.0)
MCV: 69.6 fL — AB (ref 80.0–100.0)
Platelets: 661 10*3/uL — ABNORMAL HIGH (ref 150–440)
RBC: 3.76 MIL/uL — ABNORMAL LOW (ref 4.40–5.90)
RDW: 30.6 % — AB (ref 11.5–14.5)
WBC: 10.6 10*3/uL (ref 3.8–10.6)

## 2016-11-16 LAB — BASIC METABOLIC PANEL
Anion gap: 5 (ref 5–15)
BUN: 9 mg/dL (ref 6–20)
CHLORIDE: 107 mmol/L (ref 101–111)
CO2: 24 mmol/L (ref 22–32)
CREATININE: 1.05 mg/dL (ref 0.61–1.24)
Calcium: 8 mg/dL — ABNORMAL LOW (ref 8.9–10.3)
GFR calc non Af Amer: >60 mL/min (ref >60)
Glucose, Bld: 110 mg/dL — ABNORMAL HIGH (ref 65–99)
Potassium: 3.9 mmol/L (ref 3.5–5.1)
Sodium: 136 mmol/L (ref 135–145)

## 2016-11-16 LAB — GLUCOSE, CAPILLARY: GLUCOSE-CAPILLARY: 112 mg/dL — AB (ref 65–99)

## 2016-11-16 LAB — VANCOMYCIN, TROUGH: VANCOMYCIN TR: 16 ug/mL (ref 15–20)

## 2016-11-16 LAB — SURGICAL PATHOLOGY

## 2016-11-16 MED ORDER — CARVEDILOL 12.5 MG PO TABS
12.5000 mg | ORAL_TABLET | Freq: Two times a day (BID) | ORAL | Status: DC
  Administered 2016-11-16: 12.5 mg via ORAL
  Filled 2016-11-16: qty 1

## 2016-11-16 NOTE — Progress Notes (Signed)
Patient ID: Tristan [name], male   DOB: 02-13-1945, 71 y.o.   MRN: [id number]  [facility] PROGRESS NOTE  Tristan [name] BHA:[id number] DOB: 1945/10/20 DOA: 11/12/2016 PCP: Patient, No Pcp Per  HPI/Subjective: Patient seen this morning.  He does have a little pain in his right lower extremity.  He states that he was very uncomfortable during the procedure yesterday on his awake for it.  No complaints of chest pain or shortness of breath.  Objective: Vitals:   11/16/16 0416 11/16/16 0757  BP: 125/71 138/80  Pulse: (!) 114 (!) 113  Resp: 16 (!) 22  Temp: 99 F (37.2 C) 99.7 F (37.6 C)  SpO2: 99% 97%    Filed Weights   11/14/16 0628 11/15/16 1135 11/16/16 0557  Weight: 71.2 kg (157 lb) 71.2 kg (157 lb) 72.8 kg (160 lb 8 oz)    ROS: Review of Systems  Constitutional: Negative for chills and fever.  Eyes: Negative for blurred vision.  Respiratory: Negative for cough and shortness of breath.   Cardiovascular: Negative for chest pain.  Gastrointestinal: Negative for abdominal pain, constipation, diarrhea, nausea and vomiting.  Genitourinary: Negative for dysuria.  Musculoskeletal: Positive for joint pain.  Neurological: Negative for dizziness and headaches.   Exam: Physical Exam  Constitutional: He is oriented to person, place, and time.  HENT:  Nose: No mucosal edema.  Mouth/Throat: No oropharyngeal exudate or posterior oropharyngeal edema.  Eyes: Pupils are equal, round, and reactive to light. Conjunctivae, EOM and lids are normal.  Neck: No JVD present. Carotid bruit is not present. No edema present. No thyroid mass and no thyromegaly present.  Cardiovascular: S1 normal and S2 normal.  Tachycardia present.  Exam reveals no gallop.   No murmur heard. Pulses:      Dorsalis pedis pulses are 2+ on the right side, and 2+ on the left side.  Respiratory: No respiratory distress. He has no wheezes. He has no rhonchi. He has no rales.  GI: Soft. Bowel sounds are  normal. There is no tenderness.  Musculoskeletal:       Right ankle: He exhibits no swelling.       Left ankle: He exhibits no swelling.  Lymphadenopathy:    He has no cervical adenopathy.  Neurological: He is alert and oriented to person, place, and time. No cranial nerve deficit.  Skin: Skin is warm. Nails show no clubbing.  Gangrene anterior foot and around the right ankle.  Patient able to wiggle toes.  Psychiatric: He has a normal mood and affect.      Data Reviewed: Basic Metabolic Panel:  Recent Labs Lab 11/12/16 1233 11/13/16 0453 11/13/16 1644 11/14/16 0437 11/15/16 0424 11/16/16 0422  NA 129* 131*  --   --  134* 136  K 4.1 3.3*  --   --  3.7 3.9  CL 92* 98*  --   --  102 107  CO2 26 24  --   --  24 24  GLUCOSE 145* 111*  --   --  94 110*  BUN 31* 25*  --  14 9 9   CREATININE 1.84* 1.51*  --  1.20 1.11 1.05  CALCIUM 8.7* 7.9*  --   --  8.0* 8.0*  MG  --   --  1.8  --   --   --    Liver Function Tests:  Recent Labs Lab 11/12/16 1233 11/13/16 0453  AST 24 19  ALT 14* 9*  ALKPHOS 126 107  BILITOT 1.9* 3.6*  PROT 7.8 6.2*  ALBUMIN 2.9* 2.3*   CBC:  Recent Labs Lab 11/12/16 1233 11/13/16 0453 11/13/16 1916 11/14/16 0437 11/15/16 0424 11/16/16 0422  WBC 10.8* 9.8  --  10.9* 10.4 10.6  NEUTROABS 9.2*  --   --   --   --   --   HGB 5.3* 6.2* 7.4* 7.1* 8.2* 8.0*  HCT 18.5* 20.0* 24.5* 23.5* 27.1* 26.2*  MCV 57.9* 63.1*  --  66.2* 69.2* 69.6*  PLT 798* 643*  --  718* 726* 661*   BNP (last 3 results)  Recent Labs  06/08/16 1505  BNP 3,078.0*    CBG:  Recent Labs Lab 11/14/16 0819 11/15/16 0901 11/15/16 1142 11/15/16 1505 11/16/16 0735  GLUCAP 107* 104* 85 109* 112*    Recent Results (from the past 240 hour(s))  Wound or Superficial Culture     Status: Abnormal   Collection Time: 11/12/16  3:48 PM  Result Value Ref Range Status   Specimen Description LEG PER RN ALICIA LEG ULCER  Final   Special Requests NONE  Final   Gram Stain    Final    RARE WBC PRESENT, PREDOMINANTLY PMN MODERATE GRAM NEGATIVE RODS MODERATE GRAM POSITIVE COCCI Performed at [facility], [place] [address], [place], [place] [address]    Culture MULTIPLE ORGANISMS PRESENT, NONE PREDOMINANT (A)  Final   Report Status 11/15/2016 FINAL  Final      Scheduled Meds: . allopurinol  100 mg Oral Daily  . carvedilol  6.25 mg Oral BID WC  . docusate sodium  100 mg Oral BID  .  HYDROmorphone (DILAUDID) injection  1 mg Intravenous Once  . pantoprazole  40 mg Oral BID AC  . tirofiban  25 mcg/kg Intravenous Once   Continuous Infusions: . sodium chloride Stopped (11/14/16 1103)  . sodium chloride    . sodium chloride 10 mL/hr at 11/14/16 1103  . piperacillin-tazobactam (ZOSYN)  IV Stopped (11/16/16 0948)  . vancomycin Stopped (11/16/16 0720)    Assessment/Plan:  1. Clinical sepsis with right leg infection with gangrene.  Patient status post angiogram yesterday.  Likely will end up needing an amputation.  Awaiting vascular surgery for plans on when this is.  Patient on IV vancomycin and Zosyn.  The patient is a higher risk surgical patient secondary to cardiomyopathy and severe aortic stenosis. 2. PAD with gangrene.  Status post angiogram and angioplasties and stents placed.  Patient was on Aggrastat when I saw him. 3. Acute on chronic anemia.  EGD showing gastritis.  The patient has been transfused 4 units of red blood cells.  IV iron. 4. Tachycardia increase Coreg to 12.5 mg twice daily 5. Acute kidney injury has improved with IV fluids. 6. Severe cardiomyopathy and severe aortic stenosis.  Increase Coreg to 12.5 mg twice daily.  ACE inhibitor and nitrates contraindicated.  Stop IV fluids. 7. Gout on allopurinol   Code Status:     Code Status Orders        Start     Ordered   11/12/16 1811  Full code  Continuous     11/12/16 1810    Code Status History    Date Active Date Inactive Code Status Order ID Comments User Context   06/08/2016   2:32 PM 06/12/2016  7:47 PM Partial Code [id number]  Vaughan Basta, MD Inpatient    Advance Directive Documentation     Most Recent Value  Type of Advance Directive  Healthcare Power of Attorney, Living will  Pre-existing  out of facility DNR order (yellow form or pink MOST form)  -  "MOST" Form in Place?  -     Disposition Plan: To be determined  Consultants:  Vascular surgery  Cardiology  Procedures:  Angiogram  Antibiotics:  Vancomycin  Zosyn  Time spent: 28 minutes  [place], [place]

## 2016-11-17 DIAGNOSIS — R770 Abnormality of albumin: Secondary | ICD-10-CM

## 2016-11-17 DIAGNOSIS — I70261 Atherosclerosis of native arteries of extremities with gangrene, right leg: Secondary | ICD-10-CM

## 2016-11-17 LAB — GLUCOSE, CAPILLARY
GLUCOSE-CAPILLARY: 118 mg/dL — AB (ref 65–99)
Glucose-Capillary: 98 mg/dL (ref 65–99)
Glucose-Capillary: 123 mg/dL — ABNORMAL HIGH (ref 65–99)

## 2016-11-17 LAB — CBC
HCT: 26.1 % — ABNORMAL LOW (ref 40.0–52.0)
Hemoglobin: 7.8 g/dL — ABNORMAL LOW (ref 13.0–18.0)
MCH: 20.8 pg — ABNORMAL LOW (ref 26.0–34.0)
MCHC: 29.8 g/dL — AB (ref 32.0–36.0)
MCV: 70 fL — ABNORMAL LOW (ref 80.0–100.0)
Platelets: 612 10*3/uL — ABNORMAL HIGH (ref 150–440)
RBC: 3.73 MIL/uL — ABNORMAL LOW (ref 4.40–5.90)
RDW: 31.3 % — AB (ref 11.5–14.5)
WBC: 11.1 10*3/uL — ABNORMAL HIGH (ref 3.8–10.6)

## 2016-11-17 LAB — PREPARE RBC (CROSSMATCH)

## 2016-11-17 MED ORDER — FUROSEMIDE 10 MG/ML IJ SOLN
40.0000 mg | Freq: Once | INTRAMUSCULAR | Status: AC
  Administered 2016-11-17: 40 mg via INTRAVENOUS
  Filled 2016-11-17: qty 4

## 2016-11-17 MED ORDER — ACETAMINOPHEN 325 MG PO TABS
650.0000 mg | ORAL_TABLET | Freq: Once | ORAL | Status: AC
  Administered 2016-11-17: 650 mg via ORAL
  Filled 2016-11-17: qty 2

## 2016-11-17 MED ORDER — ASPIRIN EC 81 MG PO TBEC
81.0000 mg | DELAYED_RELEASE_TABLET | Freq: Every day | ORAL | Status: DC
  Administered 2016-11-17 – 2016-11-25 (×8): 81 mg via ORAL
  Filled 2016-11-17 (×9): qty 1

## 2016-11-17 MED ORDER — CARVEDILOL 25 MG PO TABS
25.0000 mg | ORAL_TABLET | Freq: Two times a day (BID) | ORAL | Status: DC
  Administered 2016-11-17 – 2016-11-25 (×16): 25 mg via ORAL
  Filled 2016-11-17 (×17): qty 1

## 2016-11-17 MED ORDER — SODIUM CHLORIDE 0.9 % IV SOLN
Freq: Once | INTRAVENOUS | Status: AC
  Administered 2016-11-17: 08:00:00 via INTRAVENOUS

## 2016-11-17 MED ORDER — ATORVASTATIN CALCIUM 20 MG PO TABS
20.0000 mg | ORAL_TABLET | Freq: Every day | ORAL | Status: DC
  Administered 2016-11-18 – 2016-11-24 (×7): 20 mg via ORAL
  Filled 2016-11-17 (×7): qty 1

## 2016-11-17 MED ORDER — SODIUM CHLORIDE 0.9% FLUSH
3.0000 mL | Freq: Two times a day (BID) | INTRAVENOUS | Status: DC
  Administered 2016-11-17 – 2016-11-25 (×13): 3 mL via INTRAVENOUS

## 2016-11-17 MED ORDER — APIXABAN 5 MG PO TABS
5.0000 mg | ORAL_TABLET | Freq: Two times a day (BID) | ORAL | Status: DC
  Administered 2016-11-17 – 2016-11-20 (×7): 5 mg via ORAL
  Filled 2016-11-17 (×7): qty 1

## 2016-11-17 NOTE — Care Management (Signed)
Patient remains on IV antibiotics. Have reached out to attending for PT/OT consults when patient is medically stable. Continues with risk of limb loss.  has had SNF stay in May 2018

## 2016-11-17 NOTE — Progress Notes (Signed)
Pharmacy Antibiotic Note  Tristan [name] is a 71 y.o. male admitted on 11/12/2016 with sepsis.  Pharmacy has been consulted for Vancomycin and Zosyn dosing.  Plan: VT = 16 mcg/mL last night is therapeutic.  Continue vancomycin 1250 mg IV q18h and recheck trough on 11/4 Goal VT 15-20 mcg/mL  Continue piperacillin/tazobactam 3.375 g IV q8h EI   Height: 5\' 11"  (180.3 cm) Weight: 171 lb 6.4 oz (77.7 kg) IBW/kg (Calculated) : 75.3  Temp (24hrs), Avg:99.8 F (37.7 C), Min:99.1 F (37.3 C), Max:100.3 F (37.9 C)   Recent Labs Lab 11/12/16 1233 11/13/16 0453 11/14/16 0437 11/15/16 0424 11/16/16 0422 11/16/16 2149 11/17/16 0350  WBC 10.8* 9.8 10.9* 10.4 10.6  --  11.1*  CREATININE 1.84* 1.51* 1.20 1.11 1.05  --   --   VANCOTROUGH  --   --   --   --   --  16  --     Estimated Creatinine Clearance: 69.7 mL/min (by C-G formula based on SCr of 1.05 mg/dL).    No Known Allergies  Antimicrobials this admission: Vancomycin 10/28 >>  Zosyn 10/28 >>   Thank you for allowing pharmacy to be a part of this patient's care.  Lenis Noon, PharmD, BCPS Clinical Pharmacist 11/17/2016

## 2016-11-17 NOTE — Progress Notes (Signed)
PT Cancellation Note  Patient Details Name: Khalee Mazo MRN: [id number] DOB: [birth year]   Cancelled Treatment:    Reason Eval/Treat Not Completed: Patient not medically ready PT arrived in room, pt had just started blood transfusion (HGB 7.8) will be getting transfusion most of the afternoon.  Will attempt to see pt tomorrow.  Kreg Shropshire, DPT 11/17/2016, 2:24 PM

## 2016-11-17 NOTE — Progress Notes (Signed)
Patient ID: Tristan [name], male   DOB: Sep 27, 1945, 71 y.o.   MRN: [id number]  [facility] PROGRESS NOTE  Tristan [name] MLY:[id number] DOB: 08-10-1945 DOA: 11/12/2016 PCP: Patient, No Pcp Per  HPI/Subjective: The patient does have some pain in the right foot but less than previous.  He has not been walking recently.  His hemoglobin has drifted down to 7.8.  Some shortness of breath.  Still tachycardic.  He is already on dialysi  Objective: Vitals:   11/17/16 1231 11/17/16 1307  BP: 112/66 108/66  Pulse: 94 95  Resp: 18   Temp: 98.8 F (37.1 C) 99 F (37.2 C)  SpO2: 100% 99%    Filed Weights   11/15/16 1135 11/16/16 0557 11/17/16 0439  Weight: 71.2 kg (157 lb) 72.8 kg (160 lb 8 oz) 77.7 kg (171 lb 6.4 oz)    ROS: Review of Systems  Constitutional: Negative for chills and fever.  Eyes: Negative for blurred vision.  Respiratory: Negative for cough and shortness of breath.   Cardiovascular: Negative for chest pain.  Gastrointestinal: Negative for abdominal pain, constipation, diarrhea, nausea and vomiting.  Genitourinary: Negative for dysuria.  Musculoskeletal: Positive for joint pain.  Neurological: Negative for dizziness and headaches.   Exam: Physical Exam  Constitutional: He is oriented to person, place, and time.  HENT:  Nose: No mucosal edema.  Mouth/Throat: No oropharyngeal exudate or posterior oropharyngeal edema.  Eyes: Pupils are equal, round, and reactive to light. Conjunctivae, EOM and lids are normal.  Neck: No JVD present. Carotid bruit is not present. No edema present. No thyroid mass and no thyromegaly present.  Cardiovascular: S1 normal and S2 normal.  Tachycardia present.  Exam reveals no gallop.   No murmur heard. Pulses:      Dorsalis pedis pulses are 2+ on the right side, and 2+ on the left side.  Respiratory: No respiratory distress. He has no wheezes. He has no rhonchi. He has no rales.  GI: Soft. Bowel sounds are normal. There is no  tenderness.  Musculoskeletal:       Right ankle: He exhibits no swelling.       Left ankle: He exhibits no swelling.  Lymphadenopathy:    He has no cervical adenopathy.  Neurological: He is alert and oriented to person, place, and time. No cranial nerve deficit.  Skin: Skin is warm. Nails show no clubbing.  Gangrene anterior foot and around the right ankle.  Patient able to wiggle toes.  Psychiatric: He has a normal mood and affect.      Data Reviewed: Basic Metabolic Panel:  Recent Labs Lab 11/12/16 1233 11/13/16 0453 11/13/16 1644 11/14/16 0437 11/15/16 0424 11/16/16 0422  NA 129* 131*  --   --  134* 136  K 4.1 3.3*  --   --  3.7 3.9  CL 92* 98*  --   --  102 107  CO2 26 24  --   --  24 24  GLUCOSE 145* 111*  --   --  94 110*  BUN 31* 25*  --  14 9 9   CREATININE 1.84* 1.51*  --  1.20 1.11 1.05  CALCIUM 8.7* 7.9*  --   --  8.0* 8.0*  MG  --   --  1.8  --   --   --    Liver Function Tests:  Recent Labs Lab 11/12/16 1233 11/13/16 0453  AST 24 19  ALT 14* 9*  ALKPHOS 126 107  BILITOT 1.9* 3.6*  PROT  7.8 6.2*  ALBUMIN 2.9* 2.3*   CBC:  Recent Labs Lab 11/12/16 1233 11/13/16 0453 11/13/16 1916 11/14/16 0437 11/15/16 0424 11/16/16 0422 11/17/16 0350  WBC 10.8* 9.8  --  10.9* 10.4 10.6 11.1*  NEUTROABS 9.2*  --   --   --   --   --   --   HGB 5.3* 6.2* 7.4* 7.1* 8.2* 8.0* 7.8*  HCT 18.5* 20.0* 24.5* 23.5* 27.1* 26.2* 26.1*  MCV 57.9* 63.1*  --  66.2* 69.2* 69.6* 70.0*  PLT 798* 643*  --  718* 726* 661* 612*   BNP (last 3 results)  Recent Labs  06/08/16 1505  BNP 3,078.0*    CBG:  Recent Labs Lab 11/15/16 1142 11/15/16 1505 11/16/16 0735 11/17/16 0739 11/17/16 1154  GLUCAP 85 109* 112* 98 123*    Recent Results (from the past 240 hour(s))  Wound or Superficial Culture     Status: Abnormal   Collection Time: 11/12/16  3:48 PM  Result Value Ref Range Status   Specimen Description LEG PER RN ALICIA LEG ULCER  Final   Special Requests  NONE  Final   Gram Stain   Final    RARE WBC PRESENT, PREDOMINANTLY PMN MODERATE GRAM NEGATIVE RODS MODERATE GRAM POSITIVE COCCI Performed at [facility], [place] [address], [place], [place] [address]    Culture MULTIPLE ORGANISMS PRESENT, NONE PREDOMINANT (A)  Final   Report Status 11/15/2016 FINAL  Final      Scheduled Meds: . allopurinol  100 mg Oral Daily  . apixaban  5 mg Oral BID  . aspirin EC  81 mg Oral Daily  . atorvastatin  20 mg Oral q1800  . carvedilol  25 mg Oral BID WC  . docusate sodium  100 mg Oral BID  .  HYDROmorphone (DILAUDID) injection  1 mg Intravenous Once  . pantoprazole  40 mg Oral BID AC   Continuous Infusions: . sodium chloride    . sodium chloride 10 mL/hr at 11/14/16 1103  . piperacillin-tazobactam (ZOSYN)  IV Stopped (11/17/16 1026)  . vancomycin Stopped (11/17/16 0045)    Assessment/Plan:  1. Clinical sepsis with right leg infection with gangrene.  Patient status post angiogram Wednesday.   Case discussed with Dr. Lucky Cowboy vascular surgery and the patient will likely will end up needing an amputation.  The patient wanted to wait on this.  Patient on IV vancomycin and Zosyn.  We will get physical therapy and Occupational Therapy consultations 2. PAD with gangrene.  Status post angiogram and angioplasties and stents placed.   3. Acute on chronic anemia.  EGD showing gastritis.  The patient has been transfused 4 units of red blood cells and IV iron.  Since hemoglobin drifted down to 7.8 today I will give another unit of packed red blood cells today 4. Tachycardia increased Coreg to 25 mg twice daily 5. Acute kidney injury has improved with IV fluids. 6. Severe cardiomyopathy and severe aortic stenosis.  Increase Coreg to 12.5 mg twice daily.  ACE inhibitor and nitrates contraindicated.  Stopped IV fluids. 7. Gout on allopurinol   Code Status:     Code Status Orders        Start     Ordered   11/12/16 1811  Full code  Continuous     11/12/16  1810    Code Status History    Date Active Date Inactive Code Status Order ID Comments User Context   06/08/2016  2:32 PM 06/12/2016  7:47 PM  Partial Code [id number]  Vaughan Basta, MD Inpatient    Advance Directive Documentation     Most Recent Value  Type of Advance Directive  Healthcare Power of Attorney, Living will  Pre-existing out of facility DNR order (yellow form or pink MOST form)  -  "MOST" Form in Place?  -     Disposition Plan: To be determined  Consultants:  Vascular surgery  Cardiology  Procedures:  Angiogram  Antibiotics:  Vancomycin  Zosyn  Time spent: 26 minutes  [place], [place]

## 2016-11-17 NOTE — Progress Notes (Signed)
OT Cancellation Note  Patient Details Name: Tristan [name] MRN: [id number] DOB: 21-Sep-1945   Cancelled Treatment:    Reason Eval/Treat Not Completed: Medical issues which prohibited therapy (Pt. receiving Blood transfusion this afternoon. WIll continue to monitor, and intervene at a later date/time. )  Harrel Carina, MS, OTR/L 11/17/2016, 2:25 PM

## 2016-11-17 NOTE — Progress Notes (Signed)
PAD removed from L groin with no hematoma or active bleeding noted. Site is clean, dry, and intact. Gauze and Tegaderm dressing applied to site. Nursing staff will continue to monitor for any changes in patient status. Earleen Reaper, RN

## 2016-11-17 NOTE — Care Management (Signed)
Patient would be agreeable to skilled nursing placement if it is recommended.  He confirms he lives alone and now that "my leg has gotten so bad" is not able to use his walker.  Is currently using a wheelchair.

## 2016-11-17 NOTE — Progress Notes (Signed)
Patient receiving blood this nurse stayed with patient for the first 15 minutes. No signs of reaction noted explained to patient the signs and symptoms of reaction. Verbalized understanding.  Resting quietly with the bed in the lowest position with call light in reach and bed alarm on. Will continue to monitor

## 2016-11-17 NOTE — Care Management Important Message (Signed)
Important Message  Patient Details  Name: Tristan [name] MRN: [id number] Date of Birth: Feb 17, 1945   Medicare Important Message Given:  Yes  Signed IM notice given   Katrina Stack, RN 11/17/2016, 9:01 AM

## 2016-11-17 NOTE — Clinical Social Work Note (Signed)
CSW received consult that patient may need SNF placement, awaiting PT recommendations.  Jones Broom. Norval Morton, MSW, [place]  11/17/2016 8:48 PM

## 2016-11-17 NOTE — Progress Notes (Signed)
 Vein and Vascular Surgery  Daily Progress Note   Subjective  - 2 Days Post-Op  Patient says his leg is feeling somewhat better.  No major events.  Objective Vitals:   11/16/16 1722 11/16/16 2141 11/17/16 0439 11/17/16 1011  BP: 131/89 117/71 124/72 117/72  Pulse: (!) 117 (!) 110 (!) 108 (!) 112  Resp:  18 18 18   Temp:  100.3 F (37.9 C) 99.7 F (37.6 C) 99.1 F (37.3 C)  TempSrc:  Oral Oral Oral  SpO2: 100% 98% 97% 98%  Weight:   77.7 kg (171 lb 6.4 oz)   Height:        Intake/Output Summary (Last 24 hours) at 11/17/16 1119 Last data filed at 11/17/16 0950  Gross per 24 hour  Intake              480 ml  Output              400 ml  Net               80 ml    PULM  CTAB CV  tachycardic VASC  foot is reasonably warm although palpable pulses are not present.  Laboratory CBC    Component Value Date/Time   WBC 11.1 (H) 11/17/2016 0350   HGB 7.8 (L) 11/17/2016 0350   HGB 14.6 11/20/2013 0447   HCT 26.1 (L) 11/17/2016 0350   HCT 44.6 11/20/2013 0447   PLT 612 (H) 11/17/2016 0350   PLT 218 11/20/2013 0447    BMET    Component Value Date/Time   NA 136 11/16/2016 0422   NA 140 11/20/2013 0447   K 3.9 11/16/2016 0422   K 4.0 11/20/2013 0447   CL 107 11/16/2016 0422   CL 104 11/20/2013 0447   CO2 24 11/16/2016 0422   CO2 28 11/20/2013 0447   GLUCOSE 110 (H) 11/16/2016 0422   GLUCOSE 83 11/20/2013 0447   BUN 9 11/16/2016 0422   BUN 8 11/20/2013 0447   CREATININE 1.05 11/16/2016 0422   CREATININE 1.18 11/24/2013 0501   CALCIUM 8.0 (L) 11/16/2016 0422   CALCIUM 8.8 11/20/2013 0447   GFRNONAA >60 11/16/2016 0422   GFRNONAA >60 11/24/2013 0501   GFRAA >60 11/16/2016 0422   GFRAA >60 11/24/2013 0501    Assessment/Planning: POD #2 s/p extensive right lower extremity revascularization   Patient has a very high risk of limb loss and his wound is quite extensive.  He says he is not currently in that much pain and would like to give this a chance to  heal before proceeding with amputation.  That is certainly reasonable.  Given his extensive arterial disease and high risk of thrombosis, consideration for Eliquis in addition to aspirin would be a very reasonable option.  The patient is debilitated and will need rehab for both strengthening and local wound care.  I do not think he would be safe to go home at this time.  Can follow-up with me in the office in 2-3 weeks.    Leotis Pain  11/17/2016, 11:19 AM

## 2016-11-18 LAB — TYPE AND SCREEN
ABO/RH(D): B POS
ANTIBODY SCREEN: NEGATIVE
UNIT DIVISION: 0

## 2016-11-18 LAB — BPAM RBC
Blood Product Expiration Date: 201811162359
ISSUE DATE / TIME: 201811021235
UNIT TYPE AND RH: 7300

## 2016-11-18 LAB — CBC
HEMATOCRIT: 29 % — AB (ref 40.0–52.0)
HEMOGLOBIN: 8.9 g/dL — AB (ref 13.0–18.0)
MCH: 22.1 pg — ABNORMAL LOW (ref 26.0–34.0)
MCHC: 30.8 g/dL — ABNORMAL LOW (ref 32.0–36.0)
MCV: 71.9 fL — ABNORMAL LOW (ref 80.0–100.0)
Platelets: 568 10*3/uL — ABNORMAL HIGH (ref 150–440)
RBC: 4.03 MIL/uL — AB (ref 4.40–5.90)
RDW: 31.7 % — ABNORMAL HIGH (ref 11.5–14.5)
WBC: 10.6 10*3/uL (ref 3.8–10.6)

## 2016-11-18 LAB — CREATININE, SERUM: CREATININE: 0.96 mg/dL (ref 0.61–1.24)

## 2016-11-18 LAB — GLUCOSE, CAPILLARY
GLUCOSE-CAPILLARY: 109 mg/dL — AB (ref 65–99)
GLUCOSE-CAPILLARY: 118 mg/dL — AB (ref 65–99)
Glucose-Capillary: 135 mg/dL — ABNORMAL HIGH (ref 65–99)

## 2016-11-18 NOTE — NC FL2 (Signed)
[place] LEVEL OF CARE SCREENING TOOL     IDENTIFICATION  Patient Name: Tristan [name] Birthdate: 1945-08-16 Sex: male Admission Date (Current Location): 11/12/2016  [place] and [place] Number:  Engineering geologist and Address:  [facility], [address], [place], [place] [address]      Provider Number: [id number]  Attending Physician Name and Address:  Hillary Bow, MD  Relative Name and Phone Number:       Current Level of Care: Hospital Recommended Level of Care: [place] Prior Approval Number:    Date Approved/Denied: 06/12/16 PASRR Number: [id number]  Discharge Plan: SNF    Current Diagnoses: Patient Active Problem List   Diagnosis Date Noted  . Iron deficiency anemia due to chronic blood loss   . Thrombocytosis ([place]) 11/12/2016  . Cellulitis 11/12/2016  . Foot ulcer ([place]) 11/12/2016  . Symptomatic anemia 06/08/2016  . Acute renal failure (ARF) ([place]) 06/08/2016  . Hypothermia 06/08/2016  . NSTEMI (non-ST elevated myocardial infarction) ([place]) 06/08/2016    Orientation RESPIRATION BLADDER Height & Weight     Self, Time, Situation, Place  Normal Continent Weight: 169 lb 9.6 oz (76.9 kg) Height:  5\' 11"  (180.3 cm)  BEHAVIORAL SYMPTOMS/MOOD NEUROLOGICAL BOWEL NUTRITION STATUS      Continent Diet (heart healthy)  AMBULATORY STATUS COMMUNICATION OF NEEDS Skin   Extensive Assist Verbally Skin abrasions (Cellulitis right leg)                       Personal Care Assistance Level of Assistance  Bathing, Feeding, Dressing Bathing Assistance: Limited assistance Feeding assistance: Independent Dressing Assistance: Limited assistance     Functional Limitations Info             SPECIAL CARE FACTORS FREQUENCY  PT (By licensed PT), OT (By licensed OT)     PT Frequency: Up to 5X per day, 5 days per week OT Frequency: Up to 5X per day, 5 days per week            Contractures  Contractures Info: Not present    Additional Factors Info  Code Status, Allergies Code Status Info: Full Allergies Info: No known Allergies           Current Medications (11/18/2016):  This is the current hospital active medication list Current Facility-Administered Medications  Medication Dose Route Frequency Provider Last Rate Last Dose  . 0.9 %  sodium chloride infusion   Intravenous Once Pyreddy, [place], MD      . 0.9 %  sodium chloride infusion   Intravenous Once Max Sane, MD 10 mL/hr at 11/14/16 1103    . acetaminophen (TYLENOL) tablet 650 mg  650 mg Oral Q6H PRN Idelle Crouch, MD   650 mg at 11/12/16 2319   Or  . acetaminophen (TYLENOL) suppository 650 mg  650 mg Rectal Q6H PRN Idelle Crouch, MD      . allopurinol (ZYLOPRIM) tablet 100 mg  100 mg Oral Daily Idelle Crouch, MD   100 mg at 11/18/16 0909  . apixaban (ELIQUIS) tablet 5 mg  5 mg Oral BID Algernon Huxley, MD   5 mg at 11/18/16 0909  . aspirin EC tablet 81 mg  81 mg Oral Daily Algernon Huxley, MD   81 mg at 11/18/16 1025  . atorvastatin (LIPITOR) tablet 20 mg  20 mg Oral q1800 Algernon Huxley, MD      . bisacodyl (DULCOLAX) suppository 10 mg  10 mg Rectal Daily PRN Idelle Crouch, MD      . carvedilol (COREG) tablet 25 mg  25 mg Oral BID WC Loletha Grayer, MD   25 mg at 11/18/16 0909  . docusate sodium (COLACE) capsule 100 mg  100 mg Oral BID Idelle Crouch, MD   100 mg at 11/18/16 0909  . HYDROmorphone (DILAUDID) injection 1 mg  1 mg Intravenous Once Algernon Huxley, MD      . morphine 2 MG/ML injection 2 mg  2 mg Intravenous Q4H PRN Hillary Bow, MD   2 mg at 11/18/16 1054  . ondansetron (ZOFRAN) tablet 4 mg  4 mg Oral Q6H PRN Idelle Crouch, MD       Or  . ondansetron [facility]) injection 4 mg  4 mg Intravenous Q6H PRN Idelle Crouch, MD      . ondansetron [facility]) injection 4 mg  4 mg Intravenous Q6H PRN Stegmayer, Kimberly A, PA-C      . pantoprazole (PROTONIX) EC tablet 40 mg  40 mg Oral BID AC  Max Sane, MD   40 mg at 11/18/16 0909  . piperacillin-tazobactam (ZOSYN) IVPB 3.375 g  3.375 g Intravenous Q8H Fulton Reek D, MD 12.5 mL/hr at 11/18/16 1420 3.375 g at 11/18/16 1420  . sodium chloride flush (NS) 0.9 % injection 3 mL  3 mL Intravenous Q12H Loletha Grayer, MD   3 mL at 11/18/16 0914  . vancomycin (VANCOCIN) 1,250 mg in sodium chloride 0.9 % 250 mL IVPB  1,250 mg Intravenous Q18H Carrie Mew, MD   Stopped at 11/18/16 1039     Discharge Medications: Please see discharge summary for a list of discharge medications.  Relevant Imaging Results:  Relevant Lab Results:   Additional Information [id number]  Zettie Pho, LCSW

## 2016-11-18 NOTE — Progress Notes (Signed)
Patient has rested quietly this shift. Up to chair for awhile, said it feels good to sit up. Patient states he is ready to have his foot amputated "so it will quit bothering me so much." -Per MD notes, this will happen some time next week.

## 2016-11-18 NOTE — Progress Notes (Signed)
Patient ID: Tristan [name], male   DOB: Sep 09, 1945, 71 y.o.   MRN: [id number]  [facility] PROGRESS NOTE  Denham Mose TIR:[id number] DOB: [birth year] DOA: 11/12/2016 PCP: Patient, No Pcp Per  HPI/Subjective:  Pain well controlled.  Patient is decided to go ahead with amputation which he refused earlier.  Objective: Vitals:   11/18/16 0335 11/18/16 0736  BP: 119/72 126/70  Pulse: 96 95  Resp: 17 16  Temp: 99.5 F (37.5 C) 98.5 F (36.9 C)  SpO2: 99% 97%    Filed Weights   11/16/16 0557 11/17/16 0439 11/17/16 1545  Weight: 72.8 kg (160 lb 8 oz) 77.7 kg (171 lb 6.4 oz) 76.9 kg (169 lb 9.6 oz)    ROS: Review of Systems  Constitutional: Negative for chills and fever.  Eyes: Negative for blurred vision.  Respiratory: Negative for cough and shortness of breath.   Cardiovascular: Negative for chest pain.  Gastrointestinal: Negative for abdominal pain, constipation, diarrhea, nausea and vomiting.  Genitourinary: Negative for dysuria.  Musculoskeletal: Positive for joint pain.  Neurological: Negative for dizziness and headaches.   Exam: Physical Exam  Constitutional: He is oriented to person, place, and time.  HENT:  Nose: No mucosal edema.  Mouth/Throat: No oropharyngeal exudate or posterior oropharyngeal edema.  Eyes: Pupils are equal, round, and reactive to light. Conjunctivae, EOM and lids are normal.  Neck: No JVD present. Carotid bruit is not present. No edema present. No thyroid mass and no thyromegaly present.  Cardiovascular: S1 normal and S2 normal.  Tachycardia present.  Exam reveals no gallop.   No murmur heard. Pulses:      Dorsalis pedis pulses are 2+ on the right side, and 2+ on the left side.  Respiratory: No respiratory distress. He has no wheezes. He has no rhonchi. He has no rales.  GI: Soft. Bowel sounds are normal. There is no tenderness.  Musculoskeletal:       Right ankle: He exhibits no swelling.       Left ankle: He exhibits no  swelling.  Lymphadenopathy:    He has no cervical adenopathy.  Neurological: He is alert and oriented to person, place, and time. No cranial nerve deficit.  Skin: Skin is warm. Nails show no clubbing.  Gangrene anterior foot and around the right ankle.  Patient able to wiggle toes.  Psychiatric: He has a normal mood and affect.      Data Reviewed: Basic Metabolic Panel:  Recent Labs Lab 11/12/16 1233 11/13/16 0453 11/13/16 1644 11/14/16 0437 11/15/16 0424 11/16/16 0422 11/18/16 0416  NA 129* 131*  --   --  134* 136  --   K 4.1 3.3*  --   --  3.7 3.9  --   CL 92* 98*  --   --  102 107  --   CO2 26 24  --   --  24 24  --   GLUCOSE 145* 111*  --   --  94 110*  --   BUN 31* 25*  --  14 9 9   --   CREATININE 1.84* 1.51*  --  1.20 1.11 1.05 0.96  CALCIUM 8.7* 7.9*  --   --  8.0* 8.0*  --   MG  --   --  1.8  --   --   --   --    Liver Function Tests:  Recent Labs Lab 11/12/16 1233 11/13/16 0453  AST 24 19  ALT 14* 9*  ALKPHOS 126 107  BILITOT 1.9*  3.6*  PROT 7.8 6.2*  ALBUMIN 2.9* 2.3*   CBC:  Recent Labs Lab 11/12/16 1233  11/14/16 0437 11/15/16 0424 11/16/16 0422 11/17/16 0350 11/18/16 0416  WBC 10.8*  < > 10.9* 10.4 10.6 11.1* 10.6  NEUTROABS 9.2*  --   --   --   --   --   --   HGB 5.3*  < > 7.1* 8.2* 8.0* 7.8* 8.9*  HCT 18.5*  < > 23.5* 27.1* 26.2* 26.1* 29.0*  MCV 57.9*  < > 66.2* 69.2* 69.6* 70.0* 71.9*  PLT 798*  < > 718* 726* 661* 612* 568*  < > = values in this interval not displayed. BNP (last 3 results)  Recent Labs  06/08/16 1505  BNP 3,078.0*    CBG:  Recent Labs Lab 11/17/16 0739 11/17/16 1154 11/17/16 1716 11/18/16 0735 11/18/16 1148  GLUCAP 98 123* 118* 109* 135*    Recent Results (from the past 240 hour(s))  Wound or Superficial Culture     Status: Abnormal   Collection Time: 11/12/16  3:48 PM  Result Value Ref Range Status   Specimen Description LEG PER RN ALICIA LEG ULCER  Final   Special Requests NONE  Final   Gram  Stain   Final    RARE WBC PRESENT, PREDOMINANTLY PMN MODERATE GRAM NEGATIVE RODS MODERATE GRAM POSITIVE COCCI Performed at [facility], [place] [address], [place], [place] [address]    Culture MULTIPLE ORGANISMS PRESENT, NONE PREDOMINANT (A)  Final   Report Status 11/15/2016 FINAL  Final      Scheduled Meds: . allopurinol  100 mg Oral Daily  . apixaban  5 mg Oral BID  . aspirin EC  81 mg Oral Daily  . atorvastatin  20 mg Oral q1800  . carvedilol  25 mg Oral BID WC  . docusate sodium  100 mg Oral BID  .  HYDROmorphone (DILAUDID) injection  1 mg Intravenous Once  . pantoprazole  40 mg Oral BID AC  . sodium chloride flush  3 mL Intravenous Q12H   Continuous Infusions: . sodium chloride    . sodium chloride 10 mL/hr at 11/14/16 1103  . piperacillin-tazobactam (ZOSYN)  IV Stopped (11/18/16 0955)  . vancomycin Stopped (11/18/16 1039)    Assessment/Plan:  1. 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  Will need amputation.  Discussed with Dr. Lorenso Courier.  Will be scheduled next week. Patient on IV vancomycin and Zosyn.   2. PAD with gangrene.  Status post angiogram and angioplasties and stents placed.   3. Acute on chronic anemia.  EGD showing gastritis.  The patient has been transfused 4 units of red blood cells and IV iron.  4. HTN - continue medications 5. Acute kidney injury - resolved 6. Severe cardiomyopathy and severe aortic stenosis.  No signs of Fluid overload 7. Gout on allopurinol   Code Status:     Code Status Orders        Start     Ordered   11/12/16 1811  Full code  Continuous     11/12/16 1810    Code Status History    Date Active Date Inactive Code Status Order ID Comments User Context   06/08/2016  2:32 PM 06/12/2016  7:47 PM Partial Code [id number]  Vaughan Basta, MD Inpatient    Advance Directive Documentation     Most Recent Value  Type of Advance Directive  Healthcare Power of Attorney, Living will  Pre-existing out  of facility DNR  order (yellow form or pink MOST form)  -  "MOST" Form in Place?  -     Disposition Plan: To be determined  Consultants:  Vascular surgery  Cardiology  Procedures:  Angiogram  Antibiotics:  Vancomycin  Zosyn  Time spent: 30 minutes  Evyn Kooyman, [employer]

## 2016-11-18 NOTE — Evaluation (Signed)
Occupational Therapy Evaluation Patient Details Name: Tristan [name] MRN: [id number] DOB: 02-08-1945 Today's Date: 11/18/2016    History of Present Illness 71 y.o. male who was admitted 10/28 with a history of iron deficiency anemia who was admitted with symptomatic anemia and right lower extremity cellulitis. Blood transfusion 11/2, RLE revascularization 10/31.   Clinical Impression   Pt seen for OT evaluation this date. Pt is a pleasant 71yo male who has been living by himself in a first floor apt with 2 steps to enter and no handrails. Pt has been generally independent with basic ADL, but experiencing increased difficulty with ADL tasks and mobility due to increasing RLE pain over past 6-8 months with acute worsening over past couple weeks leading to this admission. Pt does not drive, has his cousin or son drive him to get groceries or to doctor appts. Pt presents with 8/10 pain in RLE distal of knee with noted black and necrotic tissue, decreased ROM and weightbearing during mobility, impaired balance, impaired strength, increased falls risk, and decreased ability to perform self care tasks. Pt will benefit from skilled OT services in order to maximize return to PLOF and minimize risk of future falls, injury, readmission, and increased need for caregiver assistance. Recommend transition to STR to support safer eventual return home.    Follow Up Recommendations  SNF    Equipment Recommendations  3 in 1 bedside commode;Toilet rise with handles    Recommendations for Other Services       Precautions / Restrictions Precautions Precautions: Fall Restrictions Weight Bearing Restrictions: No      Mobility Bed Mobility Overal bed mobility: Needs Assistance Bed Mobility: Supine to Sit;Sit to Supine     Supine to sit: Supervision;HOB elevated Sit to supine: Supervision   General bed mobility comments: increased time, guarding RLE with pain noted  Transfers Overall transfer  level: Needs assistance Equipment used: 1 person hand held assist Transfers: Squat Pivot Transfers     Squat pivot transfers: Min guard     General transfer comment: min guard with min verbal cues for hand placement for squat pivot to BSC, decreased weightbearing through RLE    Balance Overall balance assessment: Needs assistance Sitting-balance support: Feet unsupported;Feet supported;Single extremity supported Sitting balance-Leahy Scale: Good     Standing balance support: Bilateral upper extremity supported;During functional activity Standing balance-Leahy Scale: Poor Standing balance comment: decreased weightbearing through RLE                           ADL either performed or assessed with clinical judgement   ADL Overall ADL's : Needs assistance/impaired Eating/Feeding: Sitting;Set up   Grooming: Sitting;Set up   Upper Body Bathing: Sitting;Set up;Supervision/ safety   Lower Body Bathing: Sitting/lateral leans;Moderate assistance   Upper Body Dressing : Sitting;Set up   Lower Body Dressing: Sitting/lateral leans;Moderate assistance   Toilet Transfer: BSC;Squat-pivot;Min Psychiatric nurse Details (indicate cue type and reason): VC for sequencing/hand placement, pt cautious, decreased weightbearing through RLE Toileting- Clothing Manipulation and Hygiene: Maximal assistance;Sit to/from stand               Vision Baseline Vision/History: No visual deficits Patient Visual Report: No change from baseline Vision Assessment?: No apparent visual deficits     Perception     Praxis      Pertinent Vitals/Pain Pain Assessment: 0-10 Pain Score: 8  Pain Location: RLE distal of knee to ankle Pain Descriptors / Indicators: Aching;Grimacing;Guarding Pain Intervention(s): Limited activity  within patient's tolerance;Monitored during session;Repositioned     Hand Dominance  (ambidextrous per pt report)   Extremity/Trunk Assessment Upper Extremity  Assessment Upper Extremity Assessment: RUE deficits/detail;LUE deficits/detail RUE Deficits / Details: old R shoulder RTC injury per pt report 3+/5 shoulder flex, 4+/5 elbow ext/flex, 5/5 grip LUE Deficits / Details: grossly 4+/5, grip 5/5   Lower Extremity Assessment Lower Extremity Assessment: Defer to PT evaluation;Generalized weakness;RLE deficits/detail (LLE grossly 4+/5 ) RLE Deficits / Details: significant pain, necrotic tissue distal to knee, no sensation per pt report in bottom of foot RLE: Unable to fully assess due to pain RLE Sensation: decreased light touch   Cervical / Trunk Assessment Cervical / Trunk Assessment: Normal   Communication Communication Communication: No difficulties   Cognition Arousal/Alertness: Awake/alert Behavior During Therapy: WFL for tasks assessed/performed Overall Cognitive Status: Within Functional Limits for tasks assessed                                     General Comments       Exercises     Shoulder Instructions      Home Living Family/patient expects to be discharged to:: Private residence Living Arrangements: Alone   Type of Home: Apartment Home Access: Stairs to enter Technical brewer of Steps: 2 Entrance Stairs-Rails: None Home Layout: One level     Bathroom Shower/Tub: Tub/shower unit (garden tub)   Bathroom Toilet: Standard     Home Equipment: Geologist, engineering - 2 wheels;Wheelchair - manual;Shower seat          Prior Functioning/Environment Level of Independence: Needs assistance  Gait / Transfers Assistance Needed: was mod indep w/ HH mobility using axillary crutches until approx 2 wks ago due to RLE pain, has been using the w/c since ADL's / Homemaking Assistance Needed: pt roughly indep with ADL, seated shower, son or cousin drives him to appts/groceries, increasing difficulty noted for tub transfers and toilet transfers per pt report   Comments: 1 fall approx 12 months ago per pt report,  "when I had my heart attack"        OT Problem List: Decreased strength;Pain;Decreased range of motion;Impaired sensation;Decreased activity tolerance;Impaired balance (sitting and/or standing);Decreased knowledge of use of DME or AE      OT Treatment/Interventions: Self-care/ADL training;Therapeutic exercise;Therapeutic activities;DME and/or AE instruction;Patient/family education    OT Goals(Current goals can be found in the care plan section) Acute Rehab OT Goals Patient Stated Goal: have less pain OT Goal Formulation: With patient Time For Goal Achievement: 12/02/16 Potential to Achieve Goals: Good  OT Frequency: Min 1X/week   Barriers to D/C: Inaccessible home environment;Decreased caregiver support          Co-evaluation              AM-PAC PT "6 Clicks" Daily Activity     Outcome Measure Help from another person eating meals?: None Help from another person taking care of personal grooming?: None Help from another person toileting, which includes using toliet, bedpan, or urinal?: A Lot Help from another person bathing (including washing, rinsing, drying)?: A Lot Help from another person to put on and taking off regular upper body clothing?: A Little Help from another person to put on and taking off regular lower body clothing?: A Lot 6 Click Score: 17   End of Session Nurse Communication: Other (comment) (pt requesting cream or something for dry skin in R foot)  Activity Tolerance: Patient tolerated  treatment well Patient left: in bed;with call bell/phone within reach;with bed alarm set  OT Visit Diagnosis: Other abnormalities of gait and mobility (R26.89);Muscle weakness (generalized) (M62.81);Pain Pain - Right/Left: Right Pain - part of body: Ankle and joints of foot                Time: 3254-9826 OT Time Calculation (min): 19 min Charges:  OT General Charges $OT Visit: 1 Visit OT Evaluation $OT Eval Low Complexity: 1 Low G-Codes: OT G-codes **NOT FOR  INPATIENT CLASS** Functional Assessment Tool Used: AM-PAC 6 Clicks Daily Activity;Clinical judgement Functional Limitation: Self care Self Care Current Status (E1583): At least 40 percent but less than 60 percent impaired, limited or restricted Self Care Goal Status (E9407): At least 20 percent but less than 40 percent impaired, limited or restricted   Jeni Salles, MPH, MS, OTR/L ascom [phone] 11/18/16, 9:41 AM

## 2016-11-18 NOTE — Progress Notes (Signed)
[facility] Hematology/Oncology Progress Note  Date of admission: 11/12/2016  Hospital day:  11/17/2016  Chief Complaint: Tristan [name] is a 71 y.o. male who was admitted with a history of iron deficiency anemia who was admitted with symptomatic anemia and right lower extremity cellulitis.  Subjective:  Right lower extremity pain, slightly improved.  He denies any bleeding.  Social History: The patient is alone today.  Allergies: No Known Allergies  Scheduled Medications: . allopurinol  100 mg Oral Daily  . apixaban  5 mg Oral BID  . aspirin EC  81 mg Oral Daily  . atorvastatin  20 mg Oral q1800  . carvedilol  25 mg Oral BID WC  . docusate sodium  100 mg Oral BID  .  HYDROmorphone (DILAUDID) injection  1 mg Intravenous Once  . pantoprazole  40 mg Oral BID AC  . sodium chloride flush  3 mL Intravenous Q12H    Review of Systems: GENERAL: Fatigue. No sweats. Weight up 4 pounds since 05/2016 admission. PERFORMANCE STATUS (ECOG): 3 HEENT: No visual changes, runny nose, sore throat, mouth sores or tenderness. Lungs: No shortness of breath. No cough. No hemoptysis. Cardiac: Aortic stenosis.  No chest pain, palpitations, orthopnea, or PND. GI: No nausea, vomiting, diarrhea, constipation, melena or hematochezia. GU: No urgency, frequency, or hematuria. Musculoskeletal: Right foot pain.  No back pain. No muscle tenderness. Extremities: h/o gout. Right lower extremity pain. Skin: No rashes or skin changes. Neuro: No headache, numbness or weakness, balance or coordination issues. Endocrine: No diabetes, thyroid issues, hot flashes or night sweats. Psych: No mood changes, depression or anxiety. Pain: Right lower extremity pain. Review of systems: All other systems reviewed and found to be negative.  Physical Exam: Blood pressure 119/72, pulse 96, temperature 99.5 F (37.5 C), temperature source Oral, resp. rate 17, height 5\' 11"  (1.803 m),  weight 171 lb 6.4 oz (77.7 kg), SpO2 99 %.  GENERAL:Thin gentleman lying comfortably on the medical unit in no acute distress.  He yells out when the sheets are moved off of his leg. MENTAL STATUS: Alert and oriented to person, place and time. HEAD:Gray hair. Normocephalic, atraumatic, face symmetric, no Cushingoid features. EYES:Brown eyes. Pupils equal round and reactive to light and accomodation. No conjunctivitis or scleral icterus. IRJ:JOACZYSAYT clear without lesion. Tonguenormal. Poor dentition. Mucous membranes moist. RESPIRATORY:Clear to auscultationwithout rales, wheezes or rhonchi. CARDIOVASCULAR:Regular rate and rhythmwith III/VI murmur.  No rub or gallop. ABDOMEN:Soft, non-tender, with active bowel sounds, and no hepatosplenomegaly. No masses. SKIN: No rashes, ulcers or lesions. EXTREMITIES: Large area of black necrotic dry tissue extending from tibia into foot.  Irregular circular area at superior margin deep.  Chronic left lower extremity changes. NEUROLOGICAL: Able to move right sided toes. PSYCH: Appropriate.   Results for orders placed or performed during the hospital encounter of 11/12/16 (from the past 48 hour(s))  Glucose, capillary     Status: Abnormal   Collection Time: 11/16/16  7:35 AM  Result Value Ref Range   Glucose-Capillary 112 (H) 65 - 99 mg/dL  Vancomycin, trough     Status: None   Collection Time: 11/16/16  9:49 PM  Result Value Ref Range   Vancomycin Tr 16 15 - 20 ug/mL  CBC     Status: Abnormal   Collection Time: 11/17/16  3:50 AM  Result Value Ref Range   WBC 11.1 (H) 3.8 - 10.6 K/uL   RBC 3.73 (L) 4.40 - 5.90 MIL/uL   Hemoglobin 7.8 (L) 13.0 - 18.0  g/dL   HCT 26.1 (L) 40.0 - 52.0 %   MCV 70.0 (L) 80.0 - 100.0 fL   MCH 20.8 (L) 26.0 - 34.0 pg   MCHC 29.8 (L) 32.0 - 36.0 g/dL   RDW 31.3 (H) 11.5 - 14.5 %   Platelets 612 (H) 150 - 440 K/uL  Glucose, capillary     Status: None   Collection Time: 11/17/16  7:39 AM   Result Value Ref Range   Glucose-Capillary 98 65 - 99 mg/dL  Prepare RBC     Status: None   Collection Time: 11/17/16  9:21 AM  Result Value Ref Range   Order Confirmation ORDER PROCESSED BY BLOOD BANK   Type and screen [place]     Status: None (Preliminary result)   Collection Time: 11/17/16  9:21 AM  Result Value Ref Range   ABO/RH(D) B POS    Antibody Screen NEG    Sample Expiration 11/20/2016    Unit Number [id number]    Blood Component Type RED CELLS,LR    Unit division 00    Status of Unit ISSUED    Transfusion Status OK TO TRANSFUSE    Crossmatch Result Compatible   Glucose, capillary     Status: Abnormal   Collection Time: 11/17/16 11:54 AM  Result Value Ref Range   Glucose-Capillary 123 (H) 65 - 99 mg/dL  Glucose, capillary     Status: Abnormal   Collection Time: 11/17/16  5:16 PM  Result Value Ref Range   Glucose-Capillary 118 (H) 65 - 99 mg/dL   No results found.  Assessment:  Tristan [name] is a 71 y.o. male with iron deficiency anemia and reactive thrombocytosis.  Per his history, diet is normal.  However, his albumen is low.  He denies any melena, hematochezia or hematuria.  Work[facility]08[facility]2016 [facility](ferritin 7; iron saturation 7%). Normal labsincluded: B12, folate, TSH, HIV testing, acute hepatitis panel, hepatitis B core antibody, [place], and SPEP. Free light chain ratio was slightly elevated (2.09) of unclear significance. Retic was 1.6% (low).  Peripheral smear revealed no schistocytes.  There was no evidence of hemolysis. Stools were guaiac negative.    EGD on 11/14/2016 revealed a normal esophagus, gastritis, texture changed mucosa in the greater curvature (biopsied), and normal duodenum.  Colonoscopy on 11/14/2016 revealed diverticulosis in the sigmoid colon and in the descending colon.  He has symptomatic anemia c/w ongoing iron deficiency.  Ferritin is  30 (falsely elevated secondary to high sed rate).  Iron saturation is 3% (low).  Retic is 1.8% (inappropriately low for level of anemia).  LDH is normal.  B12 is 269 (borderline).  Folate is normal.  ANA is +  (SSA/Ro 4.0).  Repeat SPEP was negative.  He has a right lower extremity gangrene.  He is on vancomycin and Zosyn.  Sed rate is 107.  Plan:   1.  Hematology:  Iron deficiency anemia.  Patient has received 5 units of PRBCs to date (1 unit- 10/28, 2 units- 10/29, 1 unit- 10/30, and 1 unit-11/02) with improvement of hemoglobin from 5.3 to 7.8.  Suspect patient dry on admission (BUN 31 and Cr 1.84) and initial hemoglobin may have been lower.  Goal hemoglobin is 7.5-8.0.  Patient was unclear if he has been taking oral iron.  Patient did not follow-up in our clinic for IV iron.  Discuss the plan for IV iron weekly.  Patient to receive Venofer 200 mg IV x 1 while hospitalized  then additional infusions weekly in clinic (ferritin goal 100 if sed rate normal).   Patient states diet is good, although albumen is low.  No current evidence of GI or GU bleeding.  Guaiac all stools.  Urinalysis reveals a small amount of hemoglobin and 0-5 RBCs/HPF.  EGD and colonoscopy on 11/14/2016 revealed no active bleeding site.  Suspect reactive thrombocytosis.  Platelet count has improved from 718,000 to 568,000.  Anticipate continued improvement with resolution of iron deficiency anemia and management of infection and gangrene.  Check MMA to document whether patient has B12 deficiency (ordered).  Follow CBC daily.  Maintain active type and screen.   2.  Gastroenterology:  No evidence of bleeding.  Upper and lower endoscopy revealed no source of bleeding.  Pathology from gastric biopsy (texture change) revealed chronic active gastritis, negative for dysplasia or malignancy.  H pylori was negative, although histology was suspicious.  3.  Vascular:  Gangrene of the right lower extremity.  Patient s/p lower extremity  angiogram and extensive RLE revascularization on 11/15/2016.  Eliquis was added.   Lequita Asal, MD  11/17/2016

## 2016-11-18 NOTE — Progress Notes (Signed)
3 Days Post-Op   Subjective/Chief Complaint: Noted significant pain. States pain is unbearable. Extensive conversation with patient, he expressed that he wants to have an amputation.   Objective: Vital signs in last 24 hours: Temp:  [98.2 F (36.8 C)-99.5 F (37.5 C)] 98.5 F (36.9 C) (11/03 0736) Pulse Rate:  [88-96] 95 (11/03 0736) Resp:  [16-19] 16 (11/03 0736) BP: (97-126)/(61-72) 126/70 (11/03 0736) SpO2:  [97 %-100 %] 97 % (11/03 0736) Weight:  [76.9 kg (169 lb 9.6 oz)] 76.9 kg (169 lb 9.6 oz) (11/02 1545) Last BM Date: 11/14/16  Intake/Output from previous day: 11/02 0701 - 11/03 0700 In: 720 [P.O.:720] Out: 1600 [Urine:1600] Intake/Output this shift: Total I/O In: 240 [P.O.:240] Out: -   General appearance: alert and moderate distress Cardio: regular rate and rhythm, S1, S2 normal, no murmur, click, rub or gallop Extremities: Right leg: Edema, necrotic, eschar  Lab Results:   Recent Labs  11/17/16 0350 11/18/16 0416  WBC 11.1* 10.6  HGB 7.8* 8.9*  HCT 26.1* 29.0*  PLT 612* 568*   BMET  Recent Labs  11/16/16 0422 11/18/16 0416  NA 136  --   K 3.9  --   CL 107  --   CO2 24  --   GLUCOSE 110*  --   BUN 9  --   CREATININE 1.05 0.96  CALCIUM 8.0*  --    PT/INR No results for input(s): LABPROT, INR in the last 72 hours. ABG No results for input(s): PHART, HCO3 in the last 72 hours.  Invalid input(s): PCO2, PO2  Studies/Results: No results found.  Anti-infectives: Anti-infectives    Start     Dose/Rate Route Frequency Ordered Stop   11/15/16 0000  ceFAZolin (ANCEF) 2 g in dextrose 5 % 100 mL IVPB  Status:  Discontinued     2 g 240 mL/hr over 30 Minutes Intravenous  Once 11/14/16 1211 11/15/16 1250   11/14/16 1500  vancomycin (VANCOCIN) 1,250 mg in sodium chloride 0.9 % 250 mL IVPB     1,250 mg 166.7 mL/hr over 90 Minutes Intravenous Every 18 hours 11/14/16 1400     11/14/16 1200  ceFAZolin (ANCEF) 2 g in dextrose 5 % 100 mL IVPB  Status:   Discontinued     2 g 240 mL/hr over 30 Minutes Intravenous  Once 11/14/16 1140 11/14/16 1211   11/13/16 2100  vancomycin (VANCOCIN) IVPB 1000 mg/200 mL premix  Status:  Discontinued     1,000 mg 200 mL/hr over 60 Minutes Intravenous Every 18 hours 11/13/16 1006 11/14/16 1400   11/13/16 0300  vancomycin (VANCOCIN) 1,250 mg in sodium chloride 0.9 % 250 mL IVPB  Status:  Discontinued     1,250 mg 166.7 mL/hr over 90 Minutes Intravenous Every 24 hours 11/12/16 1903 11/13/16 1006   11/12/16 2200  piperacillin-tazobactam (ZOSYN) IVPB 3.375 g     3.375 g 12.5 mL/hr over 240 Minutes Intravenous Every 8 hours 11/12/16 1903     11/12/16 1500  piperacillin-tazobactam (ZOSYN) IVPB 3.375 g     3.375 g 100 mL/hr over 30 Minutes Intravenous  Once 11/12/16 1455 11/12/16 1533   11/12/16 1500  vancomycin (VANCOCIN) IVPB 1000 mg/200 mL premix     1,000 mg 200 mL/hr over 60 Minutes Intravenous  Once 11/12/16 1455 11/12/16 1634      Assessment/Plan: s/p Procedure(s): Lower Extremity Angiography (Right)   LOS: 6 days   Pain control Continue antibiotics Will plan for BKA next week. Jamesetta So A 11/18/2016

## 2016-11-18 NOTE — Evaluation (Signed)
Physical Therapy Evaluation Patient Details Name: Tristan [name] MRN: [id number] DOB: 24-Dec-1945 Today's Date: 11/18/2016   History of Present Illness  71 y.o. male who was admitted 10/28 with a history of iron deficiency anemia who was admitted with symptomatic anemia and right lower extremity cellulitis. Blood transfusion 11/2, RLE revascularization 10/31.  Clinical Impression  Pt is a pleasant 71 year old male who was admitted for R LE cellulitis. Pt performs bed mobility with cga and transfers/ambulation with min assist and RW. Pt demonstrates deficits with strength/pain/balance/mobility. Pt educated on use of RW, however is very unsteady on feet, needs hands on assist for performance. Pt is not at baseline level at this time. Would benefit from skilled PT to address above deficits and promote optimal return to PLOF; recommend transition to STR upon discharge from acute hospitalization.       Follow Up Recommendations SNF    Equipment Recommendations       Recommendations for Other Services       Precautions / Restrictions Precautions Precautions: Fall Restrictions Weight Bearing Restrictions: No      Mobility  Bed Mobility Overal bed mobility: Needs Assistance Bed Mobility: Supine to Sit     Supine to sit: Min guard Sit to supine: Supervision   General bed mobility comments: Pt needs cues for sequencing and set up with bed controls. Once seated at EOB, able to sit with upright posture  Transfers Overall transfer level: Needs assistance Equipment used: Rolling walker (2 wheeled) Transfers: Sit to/from Stand Sit to Stand: Min assist   Squat pivot transfers: Min guard     General transfer comment: initially tried standing without RW. Pt unable to fully stand secondary to impaired balance. 2nd attempt with RW and improved stability although L LE is slightly weak. Pt complains of R LE pain, unable to bear weight through LE.  Ambulation/Gait Ambulation/Gait  assistance: Min assist Ambulation Distance (Feet): 2 Feet Assistive device: Rolling walker (2 wheeled) Gait Pattern/deviations: Step-to pattern     General Gait Details: Pt abe to pivot on L foot and transfer to recliner. SLight posterior leaning noted and unsteadiness presents, needs hands on assist  Stairs            Wheelchair Mobility    Modified Rankin (Stroke Patients Only)       Balance Overall balance assessment: Needs assistance Sitting-balance support: Feet supported Sitting balance-Leahy Scale: Good     Standing balance support: Bilateral upper extremity supported;During functional activity Standing balance-Leahy Scale: Poor Standing balance comment: unable to weight bear through R LE                             Pertinent Vitals/Pain Pain Assessment: Faces Pain Score: 8  Faces Pain Scale: Hurts even more Pain Location: RLE distal of knee to ankle Pain Descriptors / Indicators: Aching;Grimacing;Guarding Pain Intervention(s): Limited activity within patient's tolerance;Repositioned    Home Living Family/patient expects to be discharged to:: Private residence Living Arrangements: Alone   Type of Home: Apartment Home Access: Stairs to enter Entrance Stairs-Rails: None Entrance Stairs-Number of Steps: 2 Home Layout: One level Home Equipment: Crutches;Walker - 2 wheels;Wheelchair - manual;Shower seat      Prior Function Level of Independence: Needs assistance   Gait / Transfers Assistance Needed: was mod indep w/ HH mobility using axillary crutches until approx 2 wks ago due to RLE pain, has been using the w/c since  ADL's / Homemaking Assistance Needed: pt  roughly indep with ADL, seated shower, son or cousin drives him to appts/groceries, increasing difficulty noted for tub transfers and toilet transfers per pt report  Comments: 1 fall approx 12 months ago per pt report, "when I had my heart attack"     Hand Dominance   Dominant Hand:   (ambidextrous per pt report)    Extremity/Trunk Assessment   Upper Extremity Assessment Upper Extremity Assessment: Defer to OT evaluation RUE Deficits / Details: old R shoulder RTC injury per pt report 3+/5 shoulder flex, 4+/5 elbow ext/flex, 5/5 grip LUE Deficits / Details: grossly 4+/5, grip 5/5    Lower Extremity Assessment Lower Extremity Assessment: Generalized weakness (L LE grossly 4/5; R LE grossly 3+/5) RLE Deficits / Details: significant pain, necrotic tissue distal to knee, no sensation per pt report in bottom of foot RLE: Unable to fully assess due to pain RLE Sensation: decreased light touch    Cervical / Trunk Assessment Cervical / Trunk Assessment: Normal  Communication   Communication: No difficulties  Cognition Arousal/Alertness: Awake/alert Behavior During Therapy: WFL for tasks assessed/performed Overall Cognitive Status: Within Functional Limits for tasks assessed                                        General Comments      Exercises Other Exercises Other Exercises: L LE ther-ex performed including quad sets, LAQ, and SLRs. All ther-ex performed x 10 reps with cga and cues for correct technique   Assessment/Plan    PT Assessment Patient needs continued PT services  PT Problem List Decreased strength;Decreased activity tolerance;Decreased balance;Decreased mobility;Pain       PT Treatment Interventions Gait training;DME instruction;Therapeutic exercise    PT Goals (Current goals can be found in the Care Plan section)  Acute Rehab PT Goals Patient Stated Goal: have less pain PT Goal Formulation: With patient Time For Goal Achievement: 12/02/16 Potential to Achieve Goals: Good    Frequency Min 2X/week   Barriers to discharge Decreased caregiver support      Co-evaluation               AM-PAC PT "6 Clicks" Daily Activity  Outcome Measure Difficulty turning over in bed (including adjusting bedclothes, sheets and  blankets)?: Unable Difficulty moving from lying on back to sitting on the side of the bed? : Unable Difficulty sitting down on and standing up from a chair with arms (e.g., wheelchair, bedside commode, etc,.)?: Unable Help needed moving to and from a bed to chair (including a wheelchair)?: A Little Help needed walking in hospital room?: A Lot Help needed climbing 3-5 steps with a railing? : A Lot 6 Click Score: 10    End of Session Equipment Utilized During Treatment: Gait belt Activity Tolerance: Patient limited by pain Patient left: in chair;with chair alarm set Nurse Communication: Mobility status PT Visit Diagnosis: Unsteadiness on feet (R26.81);Muscle weakness (generalized) (M62.81);Difficulty in walking, not elsewhere classified (R26.2);Pain Pain - Right/Left: Right Pain - part of body: Leg    Time: 0951-1010 PT Time Calculation (min) (ACUTE ONLY): 19 min   Charges:   PT Evaluation $PT Eval Moderate Complexity: 1 Mod PT Treatments $Therapeutic Exercise: 8-22 mins   PT G Codes:   PT G-Codes **NOT FOR INPATIENT CLASS** Functional Assessment Tool Used: AM-PAC 6 Clicks Basic Mobility Functional Limitation: Mobility: Walking and moving around Mobility: Walking and Moving Around Current Status (D5329): At least 60  percent but less than 80 percent impaired, limited or restricted Mobility: Walking and Moving Around Goal Status [phone]): At least 40 percent but less than 60 percent impaired, limited or restricted    Greggory Stallion, PT, DPT [phone]   Kal,Tagg Eustice 11/18/2016, 12:57 PM

## 2016-11-19 ENCOUNTER — Other Ambulatory Visit: Payer: Self-pay

## 2016-11-19 LAB — VANCOMYCIN, TROUGH: Vancomycin Tr: 15 ug/mL (ref 15–20)

## 2016-11-19 LAB — GLUCOSE, CAPILLARY
Glucose-Capillary: 96 mg/dL (ref 65–99)
Glucose-Capillary: 115 mg/dL — ABNORMAL HIGH (ref 65–99)

## 2016-11-19 NOTE — Progress Notes (Signed)
Patient ID: Tristan [name], male   DOB: 1945-08-10, 71 y.o.   MRN: [id number]  [facility] PROGRESS NOTE  Tristan [name] WUJ:[id number] DOB: 26-Jan-1945 DOA: 11/12/2016 PCP: Patient, No Pcp Per  HPI/Subjective:  Pain well controlled.  Amputation next week On IV abx  Objective: Vitals:   11/19/16 0329 11/19/16 0745  BP: 115/69 133/79  Pulse: 92 95  Resp: 18 19  Temp: 98.3 F (36.8 C) 99.1 F (37.3 C)  SpO2: 99% 99%    Filed Weights   11/17/16 0439 11/17/16 1545 11/19/16 0329  Weight: 77.7 kg (171 lb 6.4 oz) 76.9 kg (169 lb 9.6 oz) 75.8 kg (167 lb 3.2 oz)    ROS: Review of Systems  Constitutional: Negative for chills and fever.  Eyes: Negative for blurred vision.  Respiratory: Negative for cough and shortness of breath.   Cardiovascular: Negative for chest pain.  Gastrointestinal: Negative for abdominal pain, constipation, diarrhea, nausea and vomiting.  Genitourinary: Negative for dysuria.  Musculoskeletal: Positive for joint pain.  Neurological: Negative for dizziness and headaches.   Exam: Physical Exam  Constitutional: He is oriented to person, place, and time.  HENT:  Nose: No mucosal edema.  Mouth/Throat: No oropharyngeal exudate or posterior oropharyngeal edema.  Eyes: Conjunctivae, EOM and lids are normal. Pupils are equal, round, and reactive to light.  Neck: No JVD present. Carotid bruit is not present. No edema present. No thyroid mass and no thyromegaly present.  Cardiovascular: S1 normal and S2 normal. Tachycardia present. Exam reveals no gallop.  No murmur heard. Pulses:      Dorsalis pedis pulses are 2+ on the right side, and 2+ on the left side.  Respiratory: No respiratory distress. He has no wheezes. He has no rhonchi. He has no rales.  GI: Soft. Bowel sounds are normal. There is no tenderness.  Musculoskeletal:       Right ankle: He exhibits no swelling.       Left ankle: He exhibits no swelling.  Lymphadenopathy:    He has no  cervical adenopathy.  Neurological: He is alert and oriented to person, place, and time. No cranial nerve deficit.  Skin: Skin is warm. Nails show no clubbing.  Gangrene anterior foot and around the right ankle.  Patient able to wiggle toes.  Psychiatric: He has a normal mood and affect.      Data Reviewed: Basic Metabolic Panel: Recent Labs  Lab 11/13/16 0453 11/13/16 1644 11/14/16 0437 11/15/16 0424 11/16/16 0422 11/18/16 0416  NA 131*  --   --  134* 136  --   K 3.3*  --   --  3.7 3.9  --   CL 98*  --   --  102 107  --   CO2 24  --   --  24 24  --   GLUCOSE 111*  --   --  94 110*  --   BUN 25*  --  14 9 9   --   CREATININE 1.51*  --  1.20 1.11 1.05 0.96  CALCIUM 7.9*  --   --  8.0* 8.0*  --   MG  --  1.8  --   --   --   --    Liver Function Tests: Recent Labs  Lab 11/13/16 0453  AST 19  ALT 9*  ALKPHOS 107  BILITOT 3.6*  PROT 6.2*  ALBUMIN 2.3*   CBC: Recent Labs  Lab 11/14/16 0437 11/15/16 0424 11/16/16 0422 11/17/16 0350 11/18/16 0416  WBC 10.9* 10.4  10.6 11.1* 10.6  HGB 7.1* 8.2* 8.0* 7.8* 8.9*  HCT 23.5* 27.1* 26.2* 26.1* 29.0*  MCV 66.2* 69.2* 69.6* 70.0* 71.9*  PLT 718* 726* 661* 612* 568*   BNP (last 3 results) Recent Labs    06/08/16 1505  BNP 3,078.0*    CBG: Recent Labs  Lab 11/18/16 0735 11/18/16 1148 11/18/16 1652 11/19/16 0744 11/19/16 1153  GLUCAP 109* 135* 118* 96 115*    Recent Results (from the past 240 hour(s))  Wound or Superficial Culture     Status: Abnormal   Collection Time: 11/12/16  3:48 PM  Result Value Ref Range Status   Specimen Description LEG PER RN ALICIA LEG ULCER  Final   Special Requests NONE  Final   Gram Stain   Final    RARE WBC PRESENT, PREDOMINANTLY PMN MODERATE GRAM NEGATIVE RODS MODERATE GRAM POSITIVE COCCI Performed at [facility], [place] [address], [place], [place] [address]    Culture MULTIPLE ORGANISMS PRESENT, NONE PREDOMINANT (A)  Final   Report Status 11/15/2016 FINAL  Final       Scheduled Meds: . allopurinol  100 mg Oral Daily  . apixaban  5 mg Oral BID  . aspirin EC  81 mg Oral Daily  . atorvastatin  20 mg Oral q1800  . carvedilol  25 mg Oral BID WC  . docusate sodium  100 mg Oral BID  .  HYDROmorphone (DILAUDID) injection  1 mg Intravenous Once  . pantoprazole  40 mg Oral BID AC  . sodium chloride flush  3 mL Intravenous Q12H   Continuous Infusions: . sodium chloride    . sodium chloride 10 mL/hr at 11/14/16 1103  . piperacillin-tazobactam (ZOSYN)  IV Stopped (11/19/16 0901)  . vancomycin Stopped (11/19/16 0414)    Assessment/Plan:  1. 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  Will need amputation.  Discussed with Dr. Lorenso Courier.    Patient on IV vancomycin and Zosyn.   We will stop antibiotics after amputation. 2. PAD with gangrene.  Status post angiogram and angioplasties and stents placed.   3. Acute on chronic anemia.  EGD showing gastritis.  The patient has been transfused 4 units of red blood cells and IV iron.  4. HTN - continue medications 5. Acute kidney injury - resolved 6. Severe cardiomyopathy and severe aortic stenosis.  No signs of Fluid overload 7. Gout on allopurinol   Code Status:     Code Status Orders        Start     Ordered   11/12/16 1811  Full code  Continuous     11/12/16 1810    Code Status History    Date Active Date Inactive Code Status Order ID Comments User Context   06/08/2016  2:32 PM 06/12/2016  7:47 PM Partial Code [id number]  Vaughan Basta, MD Inpatient    Advance Directive Documentation     Most Recent Value  Type of Advance Directive  Healthcare Power of Attorney, Living will  Pre-existing out of facility DNR order (yellow form or pink MOST form)  -  "MOST" Form in Place?  -     Disposition Plan: To be determined  Consultants:  Vascular surgery  Cardiology  Procedures:  Angiogram  Antibiotics:  Vancomycin  Zosyn  Time spent: 30 minutes  Jules Baty,  [employer]

## 2016-11-19 NOTE — Clinical Social Work Note (Signed)
Clinical Social Work Assessment  Patient Details  Name: Tristan [name] MRN: [id number] Date of Birth: 08/14/1945  Date of referral:  11/19/16               Reason for consult:  Facility Placement                Permission sought to share information with:  Facility Contact Representative Permission granted to share information::  Yes, Verbal Permission Granted  Name::        Agency::     Relationship::     Contact Information:     Housing/Transportation Living arrangements for the past 2 months:  Apartment Source of Information:  Patient, Medical Team Patient Interpreter Needed:  None Criminal Activity/Legal Involvement Pertinent to Current Situation/Hospitalization:  No - Comment as needed Significant Relationships:  Church, Community Support, Siblings Lives with:  Self Do you feel safe going back to the place where you live?  Yes Need for family participation in patient care:  No (Coment)  Care giving concerns: PT recommendation for STR   Social Worker assessment / plan:  CSW met with the patient at bedside to discuss discharge planning. The patient verbalized understanding of the PT recommendation and gave permission to conduct a referral. The patient reported that [facility] prefers [facility]. [facility], and the patient has accepted such.   Currently, the patient is deliberating about having a BKA. Discharge will be dependent on his final decision about such. The CSW will continue to follow for discharge facilitation.   Employment status:  Retired Insurance information:  Medicare PT Recommendations:  Skilled Nursing Facility Information / Referral to community resources:  Skilled Nursing Facility  Patient/Family's Response to care:  The patient was pleasant and thanked the CSW for assistance and explanation.  Patient/Family's Understanding of and Emotional Response to Diagnosis, Current Treatment, and Prognosis:  The patient understands the need for SNF  level of care and is in agreement.  Emotional Assessment Appearance:  Appears stated age Attitude/Demeanor/Rapport:  Other(Pleasant, alert, cooperative) Affect (typically observed):  Appropriate, Pleasant Orientation:  Oriented to Self, Oriented to Place, Oriented to  Time, Oriented to Situation Alcohol / Substance use:  Never Used Psych involvement (Current and /or in the community):  No (Comment)  Discharge Needs  Concerns to be addressed:  Basic Needs, Care Coordination Readmission within the last 30 days:  No Current discharge risk:  Lives alone Barriers to Discharge:  Continued Medical Work up    M , LCSW 11/19/2016, 10:39 AM  

## 2016-11-19 NOTE — Progress Notes (Signed)
Pharmacy Antibiotic Note  Tristan [name] is a 71 y.o. male admitted on 11/12/2016 with sepsis.  Pharmacy has been consulted for Vancomycin and Zosyn dosing.  Plan: VT = 16 mcg/mL last night is therapeutic.  Continue vancomycin 1250 mg IV q18h and recheck trough on 11/4 Goal VT 15-20 mcg/mL  11/4 2030 vancomycin trough 15 mcg/mL. Continue current dose. Pharmacy will continue to follow and adjust as needed to maintain trough 15 to 20 mcg/mL.   Continue piperacillin/tazobactam 3.375 g IV q8h EI   Height: 5\' 11"  (180.3 cm) Weight: 167 lb 3.2 oz (75.8 kg) IBW/kg (Calculated) : 75.3  Temp (24hrs), Avg:98.6 F (37 C), Min:98.2 F (36.8 C), Max:99.1 F (37.3 C)  Recent Labs  Lab 11/13/16 0453 11/14/16 0437 11/15/16 0424 11/16/16 0422 11/16/16 2149 11/17/16 0350 11/18/16 0416 11/19/16 2030  WBC 9.8 10.9* 10.4 10.6  --  11.1* 10.6  --   CREATININE 1.51* 1.20 1.11 1.05  --   --  0.96  --   VANCOTROUGH  --   --   --   --  16  --   --  15    Estimated Creatinine Clearance: 76.3 mL/min (by C-G formula based on SCr of 0.96 mg/dL).    No Known Allergies  Antimicrobials this admission: Vancomycin 10/28 >>  Zosyn 10/28 >>   Thank you for allowing pharmacy to be a part of this patient's care.  Laural Benes, PharmD, BCPS Clinical Pharmacist 11/19/2016

## 2016-11-19 NOTE — Clinical Social Work Placement (Signed)
   CLINICAL SOCIAL WORK PLACEMENT  NOTE  Date:  11/19/2016  Patient Details  Name: Tristan [name] MRN: [id number] Date of Birth: 09-29-1945  Clinical Social Work is seeking post-discharge placement for this patient at the [place] level of care (*CSW will initial, date and re-position this form in  chart as items are completed):  Yes   Patient/family provided with [facility] Work Department's list of facilities offering this level of care within the geographic area requested by the patient (or if unable, by the patient's family).  Yes   Patient/family informed of their freedom to choose among providers that offer the needed level of care, that participate in Medicare, Medicaid or managed care program needed by the patient, have an available bed and are willing to accept the patient.  Yes   Patient/family informed of [facility]'s ownership interest in [facility] and [facility], as well as of the fact that they are under no obligation to receive care at these facilities.  PASRR submitted to EDS on 11/19/16     PASRR number received on 11/19/16     Existing PASRR number confirmed on       FL2 transmitted to all facilities in geographic area requested by pt/family on 11/19/16     FL2 transmitted to all facilities within larger geographic area on       Patient informed that his/her managed care company has contracts with or will negotiate with certain facilities, including the following:        Yes   Patient/family informed of bed offers received.  Patient [facility] bed at [facility]     Physician recommends and patient chooses bed at [facility] [place]    Patient to be transferred to [facility] on  .  Patient to be transferred to facility by       Patient family notified on   of transfer.  Name of family member notified:        PHYSICIAN       Additional Comment:     _______________________________________________ Zettie Pho, LCSW 11/19/2016, 10:43 AM

## 2016-11-20 DIAGNOSIS — I70261 Atherosclerosis of native arteries of extremities with gangrene, right leg: Secondary | ICD-10-CM

## 2016-11-20 LAB — GLUCOSE, CAPILLARY: Glucose-Capillary: 112 mg/dL — ABNORMAL HIGH (ref 65–99)

## 2016-11-20 MED ORDER — APIXABAN 5 MG PO TABS: 5.0000 mg | ORAL_TABLET | Freq: Two times a day (BID) | ORAL | Status: AC

## 2016-11-20 NOTE — Progress Notes (Signed)
Tristan [name] and Vascular Surgery  Daily Progress Note   Subjective  - 5 Days Post-Op  Lots of [name] in his foot.  Would like to go ahead and proceed with amputation. since overnight, no new events  Objective Vitals:   11/19/16 1958 11/20/16 0447 11/20/16 0820 11/20/16 1532  BP: 106/64 114/65 130/73 122/72  Pulse: 81 93 94 85  Resp: 19 18 15 16   Temp: 98.2 F (36.8 C) 98.9 F (37.2 C) 98.9 F (37.2 C) 98.7 F (37.1 C)  TempSrc: Oral Oral Oral Oral  SpO2: 100% 99% 100% 99%  Weight:  77.3 kg (170 lb 6.4 oz)    Height:        Intake/Output Summary (Last 24 hours) at 11/20/2016 1735 Last data filed at 11/20/2016 1727 Gross per 24 hour  Intake 1013 ml  Output 1150 ml  Net -137 ml    PULM  CTAB CV  RRR VASC  foot is cool, gangrenous changes on the anterior surface of the foot and lower leg  Laboratory CBC    Component Value Date/Time   WBC 10.6 11/18/2016 0416   HGB 8.9 (L) 11/18/2016 0416   HGB 14.6 11/20/2013 0447   HCT 29.0 (L) 11/18/2016 0416   HCT 44.6 11/20/2013 0447   PLT 568 (H) 11/18/2016 0416   PLT 218 11/20/2013 0447    BMET    Component Value Date/Time   NA 136 11/16/2016 0422   NA 140 11/20/2013 0447   K 3.9 11/16/2016 0422   K 4.0 11/20/2013 0447   CL 107 11/16/2016 0422   CL 104 11/20/2013 0447   CO2 24 11/16/2016 0422   CO2 28 11/20/2013 0447   GLUCOSE 110 (H) 11/16/2016 0422   GLUCOSE 83 11/20/2013 0447   BUN 9 11/16/2016 0422   BUN 8 11/20/2013 0447   CREATININE 0.96 11/18/2016 0416   CREATININE 1.18 11/24/2013 0501   CALCIUM 8.0 (L) 11/16/2016 0422   CALCIUM 8.8 11/20/2013 0447   GFRNONAA >60 11/18/2016 0416   GFRNONAA >60 11/24/2013 0501   GFRAA >60 11/18/2016 0416   GFRAA >60 11/24/2013 0501    Assessment/Planning: POD #5 s/p RLE angiogram   Has disease that is unlikely to heal, and he would like to go ahead and have the amputation which is reasonable.  No OR block time tomorrow, so will schedule for Wed or Thursday as  schedule allows  Will need rehab after  BKA planned, but possible AKA an option as well and he is aware    Tristan [name]  11/20/2016, 5:35 PM

## 2016-11-20 NOTE — Care Management (Signed)
In agreement with amputation and it is scheduled for 11/22/2016. Eliquis to be placed on hold prior to surgery.

## 2016-11-20 NOTE — Progress Notes (Signed)
When up to [facility] rt foot started to bleed, area cleansed and wrapped with kerlex, will monitor.  Dr. Darvin Neighbours made aware

## 2016-11-20 NOTE — Progress Notes (Signed)
Pt arrived to 1A just after 7pm. Pt appears comfortable, in no distress. Skin assessment done with night RN Lexine Baton.

## 2016-11-20 NOTE — Progress Notes (Addendum)
Patient ID: Robby Bulkley, male   DOB: Oct 29, 1945, 71 y.o.   MRN: [id number]  [facility] PROGRESS NOTE  Babe Clenney JME:[id number] DOB: [birth year] DOA: 11/12/2016 PCP: Patient, No Pcp Per  HPI/Subjective:  Pain well controlled.  Amputation wednesday On IV abx.  Some bleeding from foot has stopped  Objective: Vitals:   11/20/16 0447 11/20/16 0820  BP: 114/65 130/73  Pulse: 93 94  Resp: 18 15  Temp: 98.9 F (37.2 C) 98.9 F (37.2 C)  SpO2: 99% 100%    Filed Weights   11/17/16 1545 11/19/16 0329 11/20/16 0447  Weight: 76.9 kg (169 lb 9.6 oz) 75.8 kg (167 lb 3.2 oz) 77.3 kg (170 lb 6.4 oz)    ROS: Review of Systems  Constitutional: Negative for chills and fever.  Eyes: Negative for blurred vision.  Respiratory: Negative for cough and shortness of breath.   Cardiovascular: Negative for chest pain.  Gastrointestinal: Negative for abdominal pain, constipation, diarrhea, nausea and vomiting.  Genitourinary: Negative for dysuria.  Musculoskeletal: Positive for joint pain.  Neurological: Negative for dizziness and headaches.   Exam: Physical Exam  Constitutional: He is oriented to person, place, and time.  HENT:  Nose: No mucosal edema.  Mouth/Throat: No oropharyngeal exudate or posterior oropharyngeal edema.  Eyes: Conjunctivae, EOM and lids are normal. Pupils are equal, round, and reactive to light.  Neck: No JVD present. Carotid bruit is not present. No edema present. No thyroid mass and no thyromegaly present.  Cardiovascular: S1 normal and S2 normal. Tachycardia present. Exam reveals no gallop.  No murmur heard. Pulses:      Dorsalis pedis pulses are 2+ on the right side, and 2+ on the left side.  Respiratory: No respiratory distress. He has no wheezes. He has no rhonchi. He has no rales.  GI: Soft. Bowel sounds are normal. There is no tenderness.  Musculoskeletal:       Right ankle: He exhibits no swelling.       Left ankle: He exhibits no  swelling.  Lymphadenopathy:    He has no cervical adenopathy.  Neurological: He is alert and oriented to person, place, and time. No cranial nerve deficit.  Skin: Skin is warm. Nails show no clubbing.  Gangrene anterior foot and around the right ankle.  Patient able to wiggle toes.  Psychiatric: He has a normal mood and affect.      Data Reviewed: Basic Metabolic Panel: Recent Labs  Lab 11/13/16 1644 11/14/16 0437 11/15/16 0424 11/16/16 0422 11/18/16 0416  NA  --   --  134* 136  --   K  --   --  3.7 3.9  --   CL  --   --  102 107  --   CO2  --   --  24 24  --   GLUCOSE  --   --  94 110*  --   BUN  --  14 9 9   --   CREATININE  --  1.20 1.11 1.05 0.96  CALCIUM  --   --  8.0* 8.0*  --   MG 1.8  --   --   --   --    Liver Function Tests: No results for input(s): AST, ALT, ALKPHOS, BILITOT, PROT, ALBUMIN in the last 168 hours. CBC: Recent Labs  Lab 11/14/16 0437 11/15/16 0424 11/16/16 0422 11/17/16 0350 11/18/16 0416  WBC 10.9* 10.4 10.6 11.1* 10.6  HGB 7.1* 8.2* 8.0* 7.8* 8.9*  HCT 23.5* 27.1* 26.2* 26.1* 29.0*  MCV 66.2* 69.2* 69.6* 70.0* 71.9*  PLT 718* 726* 661* 612* 568*   BNP (last 3 results) Recent Labs    06/08/16 1505  BNP 3,078.0*    CBG: Recent Labs  Lab 11/18/16 1148 11/18/16 1652 11/19/16 0744 11/19/16 1153 11/20/16 0825  GLUCAP 135* 118* 96 115* 112*    Recent Results (from the past 240 hour(s))  Wound or Superficial Culture     Status: Abnormal   Collection Time: 11/12/16  3:48 PM  Result Value Ref Range Status   Specimen Description LEG PER RN ALICIA LEG ULCER  Final   Special Requests NONE  Final   Gram Stain   Final    RARE WBC PRESENT, PREDOMINANTLY PMN MODERATE GRAM NEGATIVE RODS MODERATE GRAM POSITIVE COCCI Performed at [facility], [place] [address], [place], [place] [address]    Culture MULTIPLE ORGANISMS PRESENT, NONE PREDOMINANT (A)  Final   Report Status 11/15/2016 FINAL  Final      Scheduled Meds: .  allopurinol  100 mg Oral Daily  . apixaban  5 mg Oral BID  . aspirin EC  81 mg Oral Daily  . atorvastatin  20 mg Oral q1800  . carvedilol  25 mg Oral BID WC  . docusate sodium  100 mg Oral BID  .  HYDROmorphone (DILAUDID) injection  1 mg Intravenous Once  . pantoprazole  40 mg Oral BID AC  . sodium chloride flush  3 mL Intravenous Q12H   Continuous Infusions: . sodium chloride    . sodium chloride 10 mL/hr at 11/14/16 1103  . piperacillin-tazobactam (ZOSYN)  IV Stopped (11/20/16 1012)    Assessment/Plan:  1. 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  Will need amputation.  Discussed with Dr. Lorenso Courier.    Patient on IV vancomycin and Zosyn.   Stop vancomycin today. Can stop Zosyn after amputation. 2. PAD with gangrene.  Status post angiogram and angioplasties and stents placed.   On Eliquis. Hold eliquis from tomorrow AM for surgery 3. Acute on chronic anemia.  EGD showing gastritis.  The patient has been transfused 4 units of red blood cells and IV iron.  4. HTN - continue medications 5. Acute kidney injury - resolved 6. Severe cardiomyopathy and severe aortic stenosis.  No signs of Fluid overload. 7. Gout - on allopurinol   Code Status:     Code Status Orders        Start     Ordered   11/12/16 1811  Full code  Continuous     11/12/16 1810    Code Status History    Date Active Date Inactive Code Status Order ID Comments User Context   06/08/2016  2:32 PM 06/12/2016  7:47 PM Partial Code [id number]  Vaughan Basta, MD Inpatient    Advance Directive Documentation     Most Recent Value  Type of Advance Directive  Healthcare Power of Attorney, Living will  Pre-existing out of facility DNR order (yellow form or pink MOST form)  -  "MOST" Form in Place?  -     Disposition Plan: To be determined  Consultants:  Vascular surgery  Cardiology  Procedures:  Angiogram  Antibiotics:  Zosyn  Time spent: 30 minutes  Linet Brash, [employer]

## 2016-11-20 NOTE — Progress Notes (Signed)
Report called to [facility], pt to be transferred to 1A room 145.

## 2016-11-20 NOTE — Plan of Care (Signed)
Remains on IV antibiotics, prn pain medications.  Rt lower foot/leg remains cool to touch, pending amputation on Wednesday this week.  Fall precautions in place, non skid socks in place

## 2016-11-21 LAB — SURGICAL PCR SCREEN
MRSA, PCR: NEGATIVE
STAPHYLOCOCCUS AUREUS: NEGATIVE

## 2016-11-21 LAB — CBC
HEMATOCRIT: 28.1 % — AB (ref 40.0–52.0)
Hemoglobin: 8.5 g/dL — ABNORMAL LOW (ref 13.0–18.0)
MCH: 22 pg — AB (ref 26.0–34.0)
MCHC: 30.3 g/dL — ABNORMAL LOW (ref 32.0–36.0)
MCV: 72.5 fL — AB (ref 80.0–100.0)
PLATELETS: 487 10*3/uL — AB (ref 150–440)
RBC: 3.87 MIL/uL — AB (ref 4.40–5.90)
RDW: 31.7 % — ABNORMAL HIGH (ref 11.5–14.5)
WBC: 8.4 10*3/uL (ref 3.8–10.6)

## 2016-11-21 LAB — CREATININE, SERUM
Creatinine, Ser: 0.96 mg/dL (ref 0.61–1.24)
GFR calc Af Amer: >60 mL/min (ref >60)
GFR calc non Af Amer: >60 mL/min (ref >60)

## 2016-11-21 LAB — GLUCOSE, CAPILLARY: GLUCOSE-CAPILLARY: 120 mg/dL — AB (ref 65–99)

## 2016-11-21 MED ORDER — SODIUM CHLORIDE 0.9 % IV SOLN
INTRAVENOUS | Status: DC
  Administered 2016-11-22: via INTRAVENOUS

## 2016-11-21 NOTE — Progress Notes (Signed)
Patient ID: Tristan [name], male   DOB: [birth year], 71 y.o.   MRN: [id number]  [facility] PROGRESS NOTE  Tristan [name] IDP:[id number] DOB: 1945/04/10 DOA: 11/12/2016 PCP: Patient, No Pcp Per  HPI/Subjective:  Pain is well controlled with the as needed medications.  Afebrile.  Surgery tomorrow for amputation.  Objective: Vitals:   11/20/16 1920 11/21/16 0910  BP: 115/65 121/71  Pulse: 98 92  Resp: 18 16  Temp: 99.2 F (37.3 C) 98.6 F (37 C)  SpO2: 99% 98%    Filed Weights   11/19/16 0329 11/20/16 0447 11/21/16 0500  Weight: 75.8 kg (167 lb 3.2 oz) 77.3 kg (170 lb 6.4 oz) 77.6 kg (171 lb)    ROS: Review of Systems  Constitutional: Negative for chills and fever.  Eyes: Negative for blurred vision.  Respiratory: Negative for cough and shortness of breath.   Cardiovascular: Negative for chest pain.  Gastrointestinal: Negative for abdominal pain, constipation, diarrhea, nausea and vomiting.  Genitourinary: Negative for dysuria.  Musculoskeletal: Positive for joint pain.  Neurological: Negative for dizziness and headaches.   Exam: Physical Exam  Constitutional: He is oriented to person, place, and time.  HENT:  Nose: No mucosal edema.  Mouth/Throat: No oropharyngeal exudate or posterior oropharyngeal edema.  Eyes: Conjunctivae, EOM and lids are normal. Pupils are equal, round, and reactive to light.  Neck: No JVD present. Carotid bruit is not present. No edema present. No thyroid mass and no thyromegaly present.  Cardiovascular: S1 normal and S2 normal. Tachycardia present. Exam reveals no gallop.  No murmur heard. Pulses:      Dorsalis pedis pulses are 2+ on the right side, and 2+ on the left side.  Respiratory: No respiratory distress. He has no wheezes. He has no rhonchi. He has no rales.  GI: Soft. Bowel sounds are normal. There is no tenderness.  Musculoskeletal:       Right ankle: He exhibits no swelling.       Left ankle: He exhibits no swelling.   Lymphadenopathy:    He has no cervical adenopathy.  Neurological: He is alert and oriented to person, place, and time. No cranial nerve deficit.  Skin: Skin is warm. Nails show no clubbing.  Gangrene anterior foot and around the right ankle.  Patient able to wiggle toes.  Psychiatric: He has a normal mood and affect.      Data Reviewed: Basic Metabolic Panel: Recent Labs  Lab 11/15/16 0424 11/16/16 0422 11/18/16 0416 11/21/16 0525  NA 134* 136  --   --   K 3.7 3.9  --   --   CL 102 107  --   --   CO2 24 24  --   --   GLUCOSE 94 110*  --   --   BUN 9 9  --   --   CREATININE 1.11 1.05 0.96 0.96  CALCIUM 8.0* 8.0*  --   --    Liver Function Tests: No results for input(s): AST, ALT, ALKPHOS, BILITOT, PROT, ALBUMIN in the last 168 hours. CBC: Recent Labs  Lab 11/15/16 0424 11/16/16 0422 11/17/16 0350 11/18/16 0416 11/21/16 0525  WBC 10.4 10.6 11.1* 10.6 8.4  HGB 8.2* 8.0* 7.8* 8.9* 8.5*  HCT 27.1* 26.2* 26.1* 29.0* 28.1*  MCV 69.2* 69.6* 70.0* 71.9* 72.5*  PLT 726* 661* 612* 568* 487*   BNP (last 3 results) Recent Labs    06/08/16 1505  BNP 3,078.0*    CBG: Recent Labs  Lab 11/18/16 1652 11/19/16 0744  11/19/16 1153 11/20/16 0825 11/21/16 0745  GLUCAP 118* 96 115* 112* 120*    Recent Results (from the past 240 hour(s))  Wound or Superficial Culture     Status: Abnormal   Collection Time: 11/12/16  3:48 PM  Result Value Ref Range Status   Specimen Description LEG PER RN ALICIA LEG ULCER  Final   Special Requests NONE  Final   Gram Stain   Final    RARE WBC PRESENT, PREDOMINANTLY PMN MODERATE GRAM NEGATIVE RODS MODERATE GRAM POSITIVE COCCI Performed at [facility], [place] [address], [place], [place] [address]    Culture MULTIPLE ORGANISMS PRESENT, NONE PREDOMINANT (A)  Final   Report Status 11/15/2016 FINAL  Final      Scheduled Meds: . allopurinol  100 mg Oral Daily  . aspirin EC  81 mg Oral Daily  . atorvastatin  20 mg Oral q1800  .  carvedilol  25 mg Oral BID WC  . docusate sodium  100 mg Oral BID  .  HYDROmorphone (DILAUDID) injection  1 mg Intravenous Once  . pantoprazole  40 mg Oral BID AC  . sodium chloride flush  3 mL Intravenous Q12H   Continuous Infusions: . [START ON 11/22/2016] sodium chloride    . piperacillin-tazobactam (ZOSYN)  IV Stopped (11/21/16 0940)    Assessment/Plan:  1. 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  Will need amputation.  Scheduled for tomorrow. Vancomycin stopped.  Continue IV Zosyn.  Stop IV antibiotics after surgery.  2. PAD with gangrene.  Status post angiogram and angioplasties and stents placed.   On Eliquis. Hold eliquis  for surgery  3. Acute on chronic anemia.  EGD showing gastritis.  The patient has been transfused 4 units of red blood cells and IV iron.  4.  5. HTN - continue medications 6. Acute kidney injury - resolved 7. Severe cardiomyopathy and severe aortic stenosis.  No signs of Fluid overload. 8. Gout - on allopurinol   Code Status:     Code Status Orders        Start     Ordered   11/12/16 1811  Full code  Continuous     11/12/16 1810    Code Status History    Date Active Date Inactive Code Status Order ID Comments User Context   06/08/2016  2:32 PM 06/12/2016  7:47 PM Partial Code [id number]  Vaughan Basta, MD Inpatient    Advance Directive Documentation     Most Recent Value  Type of Advance Directive  Healthcare Power of Attorney, Living will  Pre-existing out of facility DNR order (yellow form or pink MOST form)  -  "MOST" Form in Place?  -     Disposition Plan: To be determined  Consultants:  Vascular surgery  Cardiology  Procedures:  Angiogram  Antibiotics:  Zosyn  Time spent: 30 minutes  Brylie Sneath, [employer]

## 2016-11-21 NOTE — Progress Notes (Signed)
Physical Therapy Treatment Patient Details Name: Tristan [name] MRN: [id number] DOB: Oct 09, 1945 Today's Date: 11/21/2016    History of Present Illness 71 y.o. male who was admitted 10/28 with a history of iron deficiency anemia who was admitted with symptomatic anemia and right lower extremity cellulitis. Blood transfusion 11/2, RLE revascularization 10/31.    PT Comments    Mr. Bunte made modest progress with mobility this session.  He hopped on LLE to perform pivot to chair, ambulating 5 ft to get to the chair with min assist due to instability.  Pt unable to WB through RLE due to pain.  Pt has scheduled amputation tomorrow so pt educated on what to expect with PT following surgery.  He was instructed that if he has a BKA he is to keep his knee straight and to elevate with pillow below the knee.  Pt verbalized understanding.  Pt will need a new PT order following surgery.  SNF remains appropriate d/c plan.    Follow Up Recommendations  SNF     Equipment Recommendations       Recommendations for Other Services       Precautions / Restrictions Precautions Precautions: Fall Restrictions Weight Bearing Restrictions: No    Mobility  Bed Mobility               General bed mobility comments: Pt sitting EOB upon PT arrival  Transfers Overall transfer level: Needs assistance Equipment used: Rolling walker (2 wheeled) Transfers: Stand Pivot Transfers;Sit to/from Stand Sit to Stand: Min guard Stand pivot transfers: Min assist       General transfer comment: Cues for proper hand placement.  Pt demonstrates mild instability and requires min assist to remain steady with sit>stand.  Pt requires min assist for controlled descent to sit.   Ambulation/Gait Ambulation/Gait assistance: Min assist Ambulation Distance (Feet): 5 Feet Assistive device: Rolling walker (2 wheeled) Gait Pattern/deviations: (hop to)     General Gait Details: Pt hops on LLE as R foot too painful  for WBing.  Pt requires intermittent min assist due to instability.  Pt declines attempting farther ambulation at this time.    Stairs            Wheelchair Mobility    Modified Rankin (Stroke Patients Only)       Balance Overall balance assessment: Needs assistance Sitting-balance support: Feet supported Sitting balance-Leahy Scale: Good     Standing balance support: Bilateral upper extremity supported;During functional activity Standing balance-Leahy Scale: Poor Standing balance comment: Pt relies on BUE support for static and dynamic activities                            Cognition Arousal/Alertness: Awake/alert Behavior During Therapy: WFL for tasks assessed/performed Overall Cognitive Status: No family/caregiver present to determine baseline cognitive functioning                                 General Comments: Pt with delayed responses at times.  Slower processing with commands.       Exercises Amputee Exercises Quad Sets: Strengthening;Both;10 reps;Seated Hip Flexion/Marching: Both;10 reps;Seated Straight Leg Raises: Strengthening;Right;5 reps;Seated Other Exercises Other Exercises: LAQ Bil x10 each side sitting EOB.     General Comments General comments (skin integrity, edema, etc.): Pt educated on what to expect from PT following amputation.  He was instructed that if he has a BKA he is  to keep his knee straight and to elevate with pillow below the knee.  Pt verbalized understanding.        Pertinent Vitals/Pain Pain Assessment: Faces Faces Pain Scale: Hurts whole lot Pain Location: R foot Pain Descriptors / Indicators: Aching;Grimacing;Guarding Pain Intervention(s): Limited activity within patient's tolerance;Monitored during session;Repositioned    Home Living                      Prior Function            PT Goals (current goals can now be found in the care plan section) Acute Rehab PT Goals Patient Stated  Goal: decreased pain PT Goal Formulation: With patient Time For Goal Achievement: 12/02/16 Potential to Achieve Goals: Good Progress towards PT goals: Progressing toward goals(modestly)    Frequency    Min 2X/week      PT Plan Current plan remains appropriate    Co-evaluation              AM-PAC PT "6 Clicks" Daily Activity  Outcome Measure  Difficulty turning over in bed (including adjusting bedclothes, sheets and blankets)?: Unable Difficulty moving from lying on back to sitting on the side of the bed? : Unable Difficulty sitting down on and standing up from a chair with arms (e.g., wheelchair, bedside commode, etc,.)?: Unable Help needed moving to and from a bed to chair (including a wheelchair)?: A Little Help needed walking in hospital room?: A Lot Help needed climbing 3-5 steps with a railing? : A Lot 6 Click Score: 10    End of Session Equipment Utilized During Treatment: Gait belt Activity Tolerance: Patient limited by pain Patient left: in chair;with chair alarm set;with call bell/phone within reach Nurse Communication: Mobility status PT Visit Diagnosis: Unsteadiness on feet (R26.81);Muscle weakness (generalized) (M62.81);Difficulty in walking, not elsewhere classified (R26.2);Pain Pain - Right/Left: Right Pain - part of body: Leg     Time: 1047-1106 PT Time Calculation (min) (ACUTE ONLY): 19 min  Charges:  $Therapeutic Activity: 8-22 mins                    G Codes:       Collie Siad PT, DPT 11/21/2016, 1:42 PM

## 2016-11-21 NOTE — Anesthesia Preprocedure Evaluation (Addendum)
Anesthesia Evaluation  Patient identified by MRN, date of birth, ID band Patient awake    Reviewed: Allergy & Precautions, NPO status , Patient's Chart, lab work & pertinent test results, reviewed documented beta blocker date and time   Airway Mallampati: II  TM Distance: >3 FB     Dental  (+) Chipped   Pulmonary former smoker,           Cardiovascular hypertension, Pt. on medications and Pt. on home beta blockers + Past MI and + Peripheral Vascular Disease  + Valvular Problems/Murmurs AS      Neuro/Psych    GI/Hepatic   Endo/Other    Renal/GU Renal disease     Musculoskeletal   Abdominal   Peds  Hematology  (+) anemia ,   Anesthesia Other Findings ECHO 06/09/16 - EF 25%, severe LV dysfunction, severe aortic stenosis gout.  BMP 11/16/16 K- 3.9  Na- 136  CBC 11/21/16 H/H- 8.5/28.1  Reproductive/Obstetrics                           Anesthesia Physical Anesthesia Plan  ASA: III  Anesthesia Plan:    Post-op Pain Management:    Induction: Intravenous  PONV Risk Score and Plan: 1  Airway Management Planned: Oral ETT  Additional Equipment:   Intra-op Plan:   Post-operative Plan:   Informed Consent: I have reviewed the patients History and Physical, chart, labs and discussed the procedure including the risks, benefits and alternatives for the proposed anesthesia with the patient or authorized representative who has indicated his/her understanding and acceptance.     Plan Discussed with: CRNA  Anesthesia Plan Comments:       Anesthesia Quick Evaluation

## 2016-11-21 NOTE — Progress Notes (Signed)
[facility]  Daily Progress Note   Subjective  - 6 Days Post-Op  Patient continues to have pain. Ready for surgery tomorrow  Objective Vitals:   11/20/16 1532 11/20/16 1920 11/21/16 0500 11/21/16 0910  BP: 122/72 115/65  121/71  Pulse: 85 98  92  Resp: 16 18  16   Temp: 98.7 F (37.1 C) 99.2 F (37.3 C)  98.6 F (37 C)  TempSrc: Oral Oral  Oral  SpO2: 99% 99%  98%  Weight:   77.6 kg (171 lb)   Height:        Intake/Output Summary (Last 24 hours) at 11/21/2016 1623 Last data filed at 11/21/2016 1500 Gross per 24 hour  Intake 731 ml  Output 1900 ml  Net -1169 ml    PULM  CTAB CV  RRR VASC  Right leg necrotic wound is stable  Laboratory CBC    Component Value Date/Time   WBC 8.4 11/21/2016 0525   HGB 8.5 (L) 11/21/2016 0525   HGB 14.6 11/20/2013 0447   HCT 28.1 (L) 11/21/2016 0525   HCT 44.6 11/20/2013 0447   PLT 487 (H) 11/21/2016 0525   PLT 218 11/20/2013 0447    BMET    Component Value Date/Time   NA 136 11/16/2016 0422   NA 140 11/20/2013 0447   K 3.9 11/16/2016 0422   K 4.0 11/20/2013 0447   CL 107 11/16/2016 0422   CL 104 11/20/2013 0447   CO2 24 11/16/2016 0422   CO2 28 11/20/2013 0447   GLUCOSE 110 (H) 11/16/2016 0422   GLUCOSE 83 11/20/2013 0447   BUN 9 11/16/2016 0422   BUN 8 11/20/2013 0447   CREATININE 0.96 11/21/2016 0525   CREATININE 1.18 11/24/2013 0501   CALCIUM 8.0 (L) 11/16/2016 0422   CALCIUM 8.8 11/20/2013 0447   GFRNONAA >60 11/21/2016 0525   GFRNONAA >60 11/24/2013 0501   GFRAA >60 11/21/2016 0525   GFRAA >60 11/24/2013 0501    Assessment/Planning: POD #6 s/p right leg angio   Gangrene right leg unlikely to be able to get to heal well enough for limb salvage.   With persistent pain, I think it would be reasonable to go ahead and proceed with right below the knee amputation.  This would be assuming that the tissue at the mid calf level is healthy enough for amputation which it would appear.  If the  tissue at the mid calf is not healthy, above-knee amputation would be required and the patient was informed of this.  Risks and benefits the procedure were discussed.  On the schedule tomorrow for right leg amputation    Leotis Pain  11/21/2016, 4:23 PM

## 2016-11-21 NOTE — Plan of Care (Signed)
Patient is working with PT to transport in and out of wheelchair.  Tristan [name]

## 2016-11-21 NOTE — Progress Notes (Signed)
Plan is for patient to have surgery tomorrow 11/22/16 and D/C to [facility] for short term rehab when medically stable. Patient is aware of above. [facility] liaison is aware of above. Clinical Social Worker (CSW) will continue to follow and assist as needed.   [employer], LCSW [phone]

## 2016-11-21 NOTE — Progress Notes (Signed)
Occupational Therapy Treatment Patient Details Name: Tristan [name] MRN: [id number] DOB: 12-31-1945 Today's Date: 11/21/2016    History of present illness 71 y.o. male who was admitted 10/28 with a history of iron deficiency anemia who was admitted with symptomatic anemia and right lower extremity cellulitis. Blood transfusion 11/2, RLE revascularization 10/31.   OT comments  Pt pleasant and agreeable to OT session. Pt educated briefly in cognitive behavioral strategies for pain management. Will benefit from additional training to support recall and carryover in order to support greater pain management following RLE amputation (scheduled for tomorrow, 11/7). Pt also educated in basic precautions and what to expect following RLE amputation surgery, including R stump positioning, goal of wrapping, and desensitization. Will benefit from additional training to support recall and carryover in order to maximize management of stump, functional mobility, and ADL. *Will need new OT orders following surgery.*   Follow Up Recommendations  SNF    Equipment Recommendations  3 in 1 bedside commode;Toilet rise with handles    Recommendations for Other Services      Precautions / Restrictions Precautions Precautions: Fall Restrictions Weight Bearing Restrictions: No       Mobility Bed Mobility               General bed mobility comments: deferred, pt up in recliner   Transfers Overall transfer level: Needs assistance Equipment used: Rolling walker (2 wheeled) Transfers: Stand Pivot Transfers;Sit to/from Stand Sit to Stand: Min guard Stand pivot transfers: Min assist       General transfer comment: Cues for proper hand placement.  Pt demonstrates mild instability and requires min assist to remain steady with sit>stand.  Pt requires min assist for controlled descent to sit.     Balance Overall balance assessment: Needs assistance Sitting-balance support: Feet supported Sitting  balance-Leahy Scale: Good     Standing balance support: Bilateral upper extremity supported;During functional activity Standing balance-Leahy Scale: Poor Standing balance comment: Pt relies on BUE support for static and dynamic activities                           ADL either performed or assessed with clinical judgement   ADL Overall ADL's : Needs assistance/impaired                                       General ADL Comments: generally min-mod assist for LB ADL during transitional movements due to impaired balance as not weightbearing through RLE anymore because of pain     Vision Baseline Vision/History: No visual deficits Patient Visual Report: No change from baseline Vision Assessment?: No apparent visual deficits   Perception     Praxis      Cognition Arousal/Alertness: Awake/alert Behavior During Therapy: WFL for tasks assessed/performed Overall Cognitive Status: Within Functional Limits for tasks assessed                                 General Comments: Pt with delayed responses at times.  Slower processing with commands.         Exercises Other Exercises Other Exercises: Pt educated briefly in cognitive behavioral strategies for pain management. Will benefit from additional training to support recall and carryover in order to support greater pain management following RLE amputation (scheduled for tomorrow, 11/7) Other Exercises: Pt educated  in basic precautions and what to expect following RLE amputation surgery, including R stump positioning, goal of wrapping, and desensitization. Will benefit from additional training to support recall and carryover in order to maximize management of stump, functional mobility, and ADL.   Shoulder Instructions       General Comments       Pertinent Vitals/ Pain       Pain Assessment: Faces Pain Score: 4  Faces Pain Scale: Hurts whole lot Pain Location: R foot Pain Descriptors /  Indicators: Aching;Grimacing;Guarding Pain Intervention(s): Limited activity within patient's tolerance;Monitored during session;Repositioned  Home Living                                          Prior Functioning/Environment              Frequency  Min 1X/week        Progress Toward Goals  OT Goals(current goals can now be found in the care plan section)  Progress towards OT goals: Progressing toward goals  Acute Rehab OT Goals Patient Stated Goal: decreased pain OT Goal Formulation: With patient Time For Goal Achievement: 12/02/16 Potential to Achieve Goals: Good  Plan Discharge plan remains appropriate;Frequency remains appropriate(Will need new OT orders following surgery)    Co-evaluation                 AM-PAC PT "6 Clicks" Daily Activity     Outcome Measure   Help from another person eating meals?: None Help from another person taking care of personal grooming?: None Help from another person toileting, which includes using toliet, bedpan, or urinal?: A Lot Help from another person bathing (including washing, rinsing, drying)?: A Lot Help from another person to put on and taking off regular upper body clothing?: A Little Help from another person to put on and taking off regular lower body clothing?: A Lot 6 Click Score: 17    End of Session    OT Visit Diagnosis: Other abnormalities of gait and mobility (R26.89);Muscle weakness (generalized) (M62.81);Pain Pain - Right/Left: Right Pain - part of body: Ankle and joints of foot   Activity Tolerance Patient tolerated treatment well   Patient Left in chair;with call bell/phone within reach;with chair alarm set   Nurse Communication          Time: 1355-1406 OT Time Calculation (min): 11 min  Charges: OT General Charges $OT Visit: 1 Visit OT Treatments $Self Care/Home Management : 8-22 mins  Jeni Salles, MPH, MS, OTR/L ascom [phone] 11/21/16, 2:19 PM

## 2016-11-22 ENCOUNTER — Inpatient Hospital Stay: Payer: Medicare Other | Admitting: Registered Nurse

## 2016-11-22 ENCOUNTER — Encounter: Payer: Self-pay | Admitting: Anesthesiology

## 2016-11-22 ENCOUNTER — Encounter
Admission: EM | Disposition: A | Discharge status: Skilled Nursing Facility | Payer: Self-pay | Source: Home / Self Care | Attending: Internal Medicine

## 2016-11-22 HISTORY — PX: AMPUTATION: SHX166

## 2016-11-22 LAB — BASIC METABOLIC PANEL
Anion gap: 6 (ref 5–15)
BUN: 9 mg/dL (ref 6–20)
CHLORIDE: 105 mmol/L (ref 101–111)
CO2: 24 mmol/L (ref 22–32)
CREATININE: 0.91 mg/dL (ref 0.61–1.24)
Calcium: 8.4 mg/dL — ABNORMAL LOW (ref 8.9–10.3)
GFR calc non Af Amer: >60 mL/min (ref >60)
Glucose, Bld: 100 mg/dL — ABNORMAL HIGH (ref 65–99)
Potassium: 4.1 mmol/L (ref 3.5–5.1)
Sodium: 135 mmol/L (ref 135–145)

## 2016-11-22 LAB — APTT: aPTT: 53 seconds — ABNORMAL HIGH (ref 24–36)

## 2016-11-22 LAB — TYPE AND SCREEN
ABO/RH(D): B POS
ANTIBODY SCREEN: NEGATIVE

## 2016-11-22 LAB — CBC
HCT: 28 % — ABNORMAL LOW (ref 40.0–52.0)
Hemoglobin: 8.5 g/dL — ABNORMAL LOW (ref 13.0–18.0)
MCH: 22.1 pg — AB (ref 26.0–34.0)
MCHC: 30.5 g/dL — ABNORMAL LOW (ref 32.0–36.0)
MCV: 72.6 fL — ABNORMAL LOW (ref 80.0–100.0)
Platelets: 508 10*3/uL — ABNORMAL HIGH (ref 150–440)
RBC: 3.86 MIL/uL — AB (ref 4.40–5.90)
RDW: 31 % — AB (ref 11.5–14.5)
WBC: 7.7 10*3/uL (ref 3.8–10.6)

## 2016-11-22 LAB — PROTIME-INR
INR: 1.3
PROTHROMBIN TIME: 16.1 s — AB (ref 11.4–15.2)

## 2016-11-22 LAB — GLUCOSE, CAPILLARY: Glucose-Capillary: 94 mg/dL (ref 65–99)

## 2016-11-22 LAB — MAGNESIUM: Magnesium: 2.2 mg/dL (ref 1.7–2.4)

## 2016-11-22 SURGERY — AMPUTATION BELOW KNEE
Anesthesia: General | Laterality: Right | Wound class: Dirty or Infected

## 2016-11-22 MED ORDER — LIDOCAINE HCL (CARDIAC) 20 MG/ML IV SOLN
INTRAVENOUS | Status: DC | PRN
  Administered 2016-11-22: 80 mg via INTRAVENOUS

## 2016-11-22 MED ORDER — ACETAMINOPHEN 10 MG/ML IV SOLN
INTRAVENOUS | Status: DC | PRN
  Administered 2016-11-22: 1000 mg via INTRAVENOUS

## 2016-11-22 MED ORDER — MIDAZOLAM HCL 2 MG/2ML IJ SOLN
INTRAMUSCULAR | Status: DC | PRN
  Administered 2016-11-22: 1 mg via INTRAVENOUS

## 2016-11-22 MED ORDER — SODIUM CHLORIDE FLUSH 0.9 % IV SOLN
INTRAVENOUS | Status: AC
  Filled 2016-11-22: qty 10

## 2016-11-22 MED ORDER — FENTANYL CITRATE (PF) 100 MCG/2ML IJ SOLN: 25.0000 ug | INTRAMUSCULAR | Status: DC | PRN

## 2016-11-22 MED ORDER — FENTANYL CITRATE (PF) 100 MCG/2ML IJ SOLN
INTRAMUSCULAR | Status: DC | PRN
  Administered 2016-11-22 (×3): 50 ug via INTRAVENOUS

## 2016-11-22 MED ORDER — ETOMIDATE 2 MG/ML IV SOLN
INTRAVENOUS | Status: AC
  Filled 2016-11-22: qty 10

## 2016-11-22 MED ORDER — ETOMIDATE 2 MG/ML IV SOLN
INTRAVENOUS | Status: DC | PRN
  Administered 2016-11-22: 14 mg via INTRAVENOUS

## 2016-11-22 MED ORDER — ONDANSETRON HCL 4 MG/2ML IJ SOLN
INTRAMUSCULAR | Status: AC
  Filled 2016-11-22: qty 2

## 2016-11-22 MED ORDER — FENTANYL CITRATE (PF) 100 MCG/2ML IJ SOLN
INTRAMUSCULAR | Status: AC
  Filled 2016-11-22: qty 2

## 2016-11-22 MED ORDER — PHENYLEPHRINE HCL 10 MG/ML IJ SOLN
INTRAMUSCULAR | Status: DC | PRN
  Administered 2016-11-22 (×4): 100 ug via INTRAVENOUS

## 2016-11-22 MED ORDER — MIDAZOLAM HCL 2 MG/2ML IJ SOLN
INTRAMUSCULAR | Status: AC
  Filled 2016-11-22: qty 2

## 2016-11-22 MED ORDER — VASOPRESSIN 20 UNIT/ML IV SOLN
INTRAVENOUS | Status: DC | PRN
  Administered 2016-11-22 (×2): 1 [IU] via INTRAVENOUS

## 2016-11-22 MED ORDER — ROCURONIUM BROMIDE 50 MG/5ML IV SOLN
INTRAVENOUS | Status: AC
  Filled 2016-11-22: qty 1

## 2016-11-22 MED ORDER — SUGAMMADEX SODIUM 200 MG/2ML IV SOLN
INTRAVENOUS | Status: DC | PRN
  Administered 2016-11-22: 150 mg via INTRAVENOUS

## 2016-11-22 MED ORDER — ONDANSETRON HCL 4 MG/2ML IJ SOLN
INTRAMUSCULAR | Status: DC | PRN
  Administered 2016-11-22: 4 mg via INTRAVENOUS

## 2016-11-22 MED ORDER — LIDOCAINE HCL (PF) 2 % IJ SOLN
INTRAMUSCULAR | Status: AC
  Filled 2016-11-22: qty 10

## 2016-11-22 MED ORDER — SUCCINYLCHOLINE CHLORIDE 20 MG/ML IJ SOLN
INTRAMUSCULAR | Status: DC | PRN
  Administered 2016-11-22: 80 mg via INTRAVENOUS

## 2016-11-22 MED ORDER — ROCURONIUM BROMIDE 100 MG/10ML IV SOLN
INTRAVENOUS | Status: DC | PRN
  Administered 2016-11-22: 5 mg via INTRAVENOUS
  Administered 2016-11-22: 30 mg via INTRAVENOUS
  Administered 2016-11-22 (×2): 10 mg via INTRAVENOUS

## 2016-11-22 MED ORDER — ACETAMINOPHEN NICU IV SYRINGE 10 MG/ML
INTRAVENOUS | Status: AC
  Filled 2016-11-22: qty 1

## 2016-11-22 SURGICAL SUPPLY — 36 items
BANDAGE ELASTIC 6 LF NS (GAUZE/BANDAGES/DRESSINGS) ×3 IMPLANT
BLADE SAGITTAL WIDE XTHICK NO (BLADE) ×3 IMPLANT
BNDG COHESIVE 4X5 TAN STRL (GAUZE/BANDAGES/DRESSINGS) ×3 IMPLANT
BNDG GAUZE 4.5X4.1 6PLY STRL (MISCELLANEOUS) ×6 IMPLANT
BRUSH SCRUB EZ  4% CHG (MISCELLANEOUS) ×2
BRUSH SCRUB EZ 4% CHG (MISCELLANEOUS) ×1 IMPLANT
CANISTER SUCT 1200ML W/VALVE (MISCELLANEOUS) ×3 IMPLANT
DRAIN PENROSE 1/4X12 LTX (DRAIN) ×3 IMPLANT
DURAPREP 26ML APPLICATOR (WOUND CARE) ×3 IMPLANT
ELECT CAUTERY BLADE 6.4 (BLADE) ×3 IMPLANT
ELECT REM PT RETURN 9FT ADLT (ELECTROSURGICAL) ×3
ELECTRODE REM PT RTRN 9FT ADLT (ELECTROSURGICAL) ×1 IMPLANT
GAUZE PETRO XEROFOAM 1X8 (MISCELLANEOUS) ×6 IMPLANT
GLOVE BIO SURGEON STRL SZ7 (GLOVE) ×6 IMPLANT
GLOVE INDICATOR 7.5 STRL GRN (GLOVE) ×3 IMPLANT
GOWN STRL REUS W/ TWL LRG LVL3 (GOWN DISPOSABLE) ×2 IMPLANT
GOWN STRL REUS W/ TWL XL LVL3 (GOWN DISPOSABLE) ×1 IMPLANT
GOWN STRL REUS W/TWL LRG LVL3 (GOWN DISPOSABLE) ×4
GOWN STRL REUS W/TWL XL LVL3 (GOWN DISPOSABLE) ×2
HANDLE YANKAUER SUCT BULB TIP (MISCELLANEOUS) ×3 IMPLANT
KIT RM TURNOVER STRD PROC AR (KITS) ×3 IMPLANT
LABEL OR SOLS (LABEL) ×3 IMPLANT
NS IRRIG 1000ML POUR BTL (IV SOLUTION) ×3 IMPLANT
PACK EXTREMITY ARMC (MISCELLANEOUS) ×3 IMPLANT
PAD ABD DERMACEA PRESS 5X9 (GAUZE/BANDAGES/DRESSINGS) ×6 IMPLANT
PAD PREP 24X41 OB/GYN DISP (PERSONAL CARE ITEMS) ×3 IMPLANT
SPONGE LAP 18X18 5 PK (GAUZE/BANDAGES/DRESSINGS) ×3 IMPLANT
STAPLER SKIN PROX 35W (STAPLE) ×3 IMPLANT
STOCKINETTE M/LG 89821 (MISCELLANEOUS) ×3 IMPLANT
SUT SILK 2 0 (SUTURE) ×2
SUT SILK 2 0 SH (SUTURE) ×6 IMPLANT
SUT SILK 2-0 18XBRD TIE 12 (SUTURE) ×1 IMPLANT
SUT SILK 3 0 (SUTURE) ×2
SUT SILK 3-0 18XBRD TIE 12 (SUTURE) ×1 IMPLANT
SUT VIC AB 0 CT1 36 (SUTURE) ×6 IMPLANT
SUT VIC AB 2-0 CT1 (SUTURE) ×6 IMPLANT

## 2016-11-22 NOTE — Transfer of Care (Signed)
Immediate Anesthesia Transfer of Care Note  Patient: Tristan [name]  Procedure(s) Performed: AMPUTATION BELOW KNEE (Right )  Patient Location: PACU  Anesthesia Type:General  Level of Consciousness: awake and alert   Airway & Oxygen Therapy: Patient Spontanous Breathing and Patient connected to face mask oxygen  Post-op Assessment: Report given to RN and Post -op Vital signs reviewed and stable  Post vital signs: Reviewed and stable  Last Vitals:  Vitals:   11/22/16 1540 11/22/16 1542  BP: 126/77 (!) 85/55  Pulse: 84 83  Resp: 13 16  Temp:  36.8 C  SpO2: 100% 100%    Last Pain:  Vitals:   11/22/16 1259  TempSrc: Tympanic  PainSc: 8       Patients Stated Pain Goal: 2 (90/21/11 5520)  Complications: No apparent anesthesia complications

## 2016-11-22 NOTE — H&P (Signed)
 [facility] History & Physical Update  The patient was interviewed and re-examined.  The patient's previous History and Physical has been reviewed and is unchanged.  There is no change in the plan of care. We plan to proceed with the scheduled procedure.  Leotis Pain, MD  11/22/2016, 1:29 PM

## 2016-11-22 NOTE — Plan of Care (Signed)
Patient is A+O with no signs of distress. Pain is controlled with current medications. No signs of distress.

## 2016-11-22 NOTE — Anesthesia Procedure Notes (Signed)
Procedure Name: Intubation Performed by: Lance Muss, CRNA Pre-anesthesia Checklist: Patient identified, Patient being monitored, Timeout performed, Emergency Drugs available and Suction available Patient Re-evaluated:Patient Re-evaluated prior to induction Oxygen Delivery Method: Circle system utilized Preoxygenation: Pre-oxygenation with 100% oxygen Induction Type: IV induction Ventilation: Mask ventilation without difficulty Laryngoscope Size: Mac and 3 Grade View: Grade I Tube type: Oral Tube size: 7.5 mm Number of attempts: 1 Airway Equipment and Method: Stylet Placement Confirmation: ETT inserted through vocal cords under direct vision,  positive ETCO2 and breath sounds checked- equal and bilateral Secured at: 22 cm Tube secured with: Tape Dental Injury: Teeth and Oropharynx as per pre-operative assessment

## 2016-11-22 NOTE — Anesthesia Post-op Follow-up Note (Signed)
Anesthesia QCDR form completed.        

## 2016-11-22 NOTE — Progress Notes (Signed)
Patient ID: Tristan [name], male   DOB: 15-Feb-1945, 71 y.o.   MRN: [id number]  [facility] PROGRESS NOTE  Tristan [name] VOZ:[id number] DOB: 06/10/1945 DOA: 11/12/2016 PCP: Patient, No Pcp Per  HPI/Subjective:  Pain is well controlled with the as needed medications.  Afebrile.   Surgery today.  Objective: Vitals:   11/21/16 2024 11/22/16 0841  BP: 96/60 126/71  Pulse: 77 91  Resp: 17 20  Temp: 98.7 F (37.1 C) 97.7 F (36.5 C)  SpO2: 100% 98%    Filed Weights   11/20/16 0447 11/21/16 0500 11/22/16 0425  Weight: 77.3 kg (170 lb 6.4 oz) 77.6 kg (171 lb) 77.1 kg (169 lb 14.4 oz)    ROS: Review of Systems  Constitutional: Negative for chills and fever.  Eyes: Negative for blurred vision.  Respiratory: Negative for cough and shortness of breath.   Cardiovascular: Negative for chest pain.  Gastrointestinal: Negative for abdominal pain, constipation, diarrhea, nausea and vomiting.  Genitourinary: Negative for dysuria.  Musculoskeletal: Positive for joint pain.  Neurological: Negative for dizziness and headaches.   Exam: Physical Exam  Constitutional: He is oriented to person, place, and time.  HENT:  Nose: No mucosal edema.  Mouth/Throat: No oropharyngeal exudate or posterior oropharyngeal edema.  Eyes: Conjunctivae, EOM and lids are normal. Pupils are equal, round, and reactive to light.  Neck: No JVD present. Carotid bruit is not present. No edema present. No thyroid mass and no thyromegaly present.  Cardiovascular: S1 normal and S2 normal. Tachycardia present. Exam reveals no gallop.  No murmur heard. Pulses:      Dorsalis pedis pulses are 2+ on the right side, and 2+ on the left side.  Respiratory: No respiratory distress. He has no wheezes. He has no rhonchi. He has no rales.  GI: Soft. Bowel sounds are normal. There is no tenderness.  Musculoskeletal:       Right ankle: He exhibits no swelling.       Left ankle: He exhibits no swelling.   Lymphadenopathy:    He has no cervical adenopathy.  Neurological: He is alert and oriented to person, place, and time. No cranial nerve deficit.  Skin: Skin is warm. Nails show no clubbing.  Gangrene anterior foot and around the right ankle.  Patient able to wiggle toes.  Psychiatric: He has a normal mood and affect.      Data Reviewed: Basic Metabolic Panel: Recent Labs  Lab 11/16/16 0422 11/18/16 0416 11/21/16 0525 11/22/16 0448  NA 136  --   --  135  K 3.9  --   --  4.1  CL 107  --   --  105  CO2 24  --   --  24  GLUCOSE 110*  --   --  100*  BUN 9  --   --  9  CREATININE 1.05 0.96 0.96 0.91  CALCIUM 8.0*  --   --  8.4*  MG  --   --   --  2.2   Liver Function Tests: No results for input(s): AST, ALT, ALKPHOS, BILITOT, PROT, ALBUMIN in the last 168 hours. CBC: Recent Labs  Lab 11/16/16 0422 11/17/16 0350 11/18/16 0416 11/21/16 0525 11/22/16 0448  WBC 10.6 11.1* 10.6 8.4 7.7  HGB 8.0* 7.8* 8.9* 8.5* 8.5*  HCT 26.2* 26.1* 29.0* 28.1* 28.0*  MCV 69.6* 70.0* 71.9* 72.5* 72.6*  PLT 661* 612* 568* 487* 508*   BNP (last 3 results) Recent Labs    06/08/16 1505  BNP 3,078.0*  CBG: Recent Labs  Lab 11/19/16 0744 11/19/16 1153 11/20/16 0825 11/21/16 0745 11/22/16 0740  GLUCAP 96 115* 112* 120* 94    Recent Results (from the past 240 hour(s))  Wound or Superficial Culture     Status: Abnormal   Collection Time: 11/12/16  3:48 PM  Result Value Ref Range Status   Specimen Description LEG PER RN ALICIA LEG ULCER  Final   Special Requests NONE  Final   Gram Stain   Final    RARE WBC PRESENT, PREDOMINANTLY PMN MODERATE GRAM NEGATIVE RODS MODERATE GRAM POSITIVE COCCI Performed at [facility], [place] [address], [place], [place] [address]    Culture MULTIPLE ORGANISMS PRESENT, NONE PREDOMINANT (A)  Final   Report Status 11/15/2016 FINAL  Final  Surgical PCR screen     Status: None   Collection Time: 11/21/16  5:56 PM  Result Value Ref Range Status    MRSA, PCR NEGATIVE NEGATIVE Final   Staphylococcus aureus NEGATIVE NEGATIVE Final    Comment: (NOTE) The Xpert SA Assay (FDA approved for NASAL specimens in patients 45 years of age and older), is one component of a comprehensive surveillance program. It is not intended to diagnose infection nor to guide or monitor treatment.       Scheduled Meds: . allopurinol  100 mg Oral Daily  . aspirin EC  81 mg Oral Daily  . atorvastatin  20 mg Oral q1800  . carvedilol  25 mg Oral BID WC  . docusate sodium  100 mg Oral BID  .  HYDROmorphone (DILAUDID) injection  1 mg Intravenous Once  . pantoprazole  40 mg Oral BID AC  . sodium chloride flush  3 mL Intravenous Q12H   Continuous Infusions: . sodium chloride 75 mL/hr at 11/22/16 0020  . piperacillin-tazobactam (ZOSYN)  IV 3.375 g (11/22/16 0425)    Assessment/Plan:  1. 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  Will need amputation.  Scheduled for tomorrow. Vancomycin stopped.  Continue IV Zosyn.   Surgery today. Discussed with Dr. Lucky Cowboy  2. PAD with gangrene.  Status post angiogram and angioplasties and stents placed.   On Eliquis. Hold eliquis  for surgery  3. Acute on chronic anemia.  EGD showing gastritis.  The patient has been transfused 4 units of red blood cells and IV iron.   4. HTN - continue medications 5. Acute kidney injury - resolved 6. Severe cardiomyopathy and severe aortic stenosis.  No signs of Fluid overload. 7. Gout - on allopurinol   Code Status:     Code Status Orders        Start     Ordered   11/12/16 1811  Full code  Continuous     11/12/16 1810    Code Status History    Date Active Date Inactive Code Status Order ID Comments User Context   06/08/2016  2:32 PM 06/12/2016  7:47 PM Partial Code [id number]  Vaughan Basta, MD Inpatient    Advance Directive Documentation     Most Recent Value  Type of Advance Directive  Healthcare Power of Attorney, Living will   Pre-existing out of facility DNR order (yellow form or pink MOST form)  -  "MOST" Form in Place?  -     Disposition Plan: To be determined  Consultants:  Vascular surgery  Cardiology  Procedures:  Angiogram  Antibiotics:  Zosyn  Time spent: 30 minutes  Rosela Supak, [employer]

## 2016-11-22 NOTE — Op Note (Signed)
   OPERATIVE NOTE   PROCEDURE: Right below-the-knee amputation  PRE-OPERATIVE DIAGNOSIS: Right foot gangrene  POST-OPERATIVE DIAGNOSIS: same as above  SURGEON: Leotis Pain, MD  ASSISTANT(S): none  ANESTHESIA: general  ESTIMATED BLOOD LOSS: 75 cc  FINDING(S): none  SPECIMEN(S):  Right below-the-knee amputation  INDICATIONS:   Tristan [name] is a 71 y.o. male who presents with right lower leg gangrene.  The patient is scheduled for a right below-the-knee amputation.  I discussed in depth with the patient the risks, benefits, and alternatives to this procedure.  The patient is aware that the risk of this operation included but are not limited to:  bleeding, infection, myocardial infarction, stroke, death, failure to heal amputation wound, and possible need for more proximal amputation.  The patient is aware of the risks and agrees proceed forward with the procedure.  DESCRIPTION:  After full informed written consent was obtained from the patient, the patient was brought back to the operating room, and placed supine upon the operating table.  Prior to induction, the patient received IV antibiotics.  The patient was then prepped and draped in the standard fashion for a below-the-knee amputation.  After obtaining adequate anesthesia, the patient was prepped and draped in the standard fashion for a right below-the-knee amputation.  I marked out the anterior incision two finger breadths below the tibial tuberosity and then the marked out a posterior flap that was one third of the circumference of the calf in length.   I made the incisions for these flaps, and then dissected through the subcutaneous tissue, fascia, and muscle anteriorly.  I elevated  the periosteal tissue superiorly so that the tibia was about 3-4 cm shorter than the anterior skin flap.  I then transected the tibia with a power saw and then took a wedge off the tibia anteriorly with the power saw.  Then I smoothed out the rough  edges.  In a similar fashion, I cut back the fibula about two centimeters higher than the level of the tibia with a bone cutter.  I put a bone hook into the distal tibia and then used a large amputation knife to sharply develop a tissue plane through the muscle along the fibula.  In such fashion, the posterior flap was developed.  At this point, the specimen was passed off the field as the below-the-knee amputation.  At this point, I clamped all visibly bleeding arteries and veins using a combination of suture ligation with Silk suture and electrocautery.  Bleeding continued to be controlled with electrocautery and suture ligature.  The stump was washed off with sterile normal saline and no further active bleeding was noted.  I reapproximated the anterior and posterior fascia  with interrupted stitches of 0 Vicryl.  This was completed along the entire length of anterior and posterior fascia until there were no more loose space in the fascial line. I then placed a layer of 2-0 Vicryl sutures in the subcutaneous tissue. The skin was then  reapproximated with staples.  The stump was washed off and dried.  The incision was dressed with Xeroform and  then fluffs were applied.  Kerlix was wrapped around the leg and then gently an ACE wrap was applied.    COMPLICATIONS: none  CONDITION: stable   Leotis Pain  11/22/2016, 3:41 PM    This note was created with Dragon Medical transcription system. Any errors in dictation are purely unintentional.

## 2016-11-23 ENCOUNTER — Encounter: Payer: Self-pay | Admitting: Vascular Surgery

## 2016-11-23 LAB — BASIC METABOLIC PANEL
ANION GAP: 8 (ref 5–15)
BUN: 9 mg/dL (ref 6–20)
CALCIUM: 8.2 mg/dL — AB (ref 8.9–10.3)
CO2: 22 mmol/L (ref 22–32)
CREATININE: 0.88 mg/dL (ref 0.61–1.24)
Chloride: 106 mmol/L (ref 101–111)
Glucose, Bld: 121 mg/dL — ABNORMAL HIGH (ref 65–99)
Potassium: 4.1 mmol/L (ref 3.5–5.1)
SODIUM: 136 mmol/L (ref 135–145)

## 2016-11-23 LAB — CBC
HCT: 25.8 % — ABNORMAL LOW (ref 40.0–52.0)
Hemoglobin: 8 g/dL — ABNORMAL LOW (ref 13.0–18.0)
MCH: 22.8 pg — ABNORMAL LOW (ref 26.0–34.0)
MCHC: 31 g/dL — AB (ref 32.0–36.0)
MCV: 73.6 fL — ABNORMAL LOW (ref 80.0–100.0)
PLATELETS: 470 10*3/uL — AB (ref 150–440)
RBC: 3.5 MIL/uL — ABNORMAL LOW (ref 4.40–5.90)
RDW: 31.5 % — AB (ref 11.5–14.5)
WBC: 8.9 10*3/uL (ref 3.8–10.6)

## 2016-11-23 LAB — GLUCOSE, CAPILLARY: Glucose-Capillary: 114 mg/dL — ABNORMAL HIGH (ref 65–99)

## 2016-11-23 MED ORDER — AMOXICILLIN-POT CLAVULANATE 875-125 MG PO TABS
1.0000 | ORAL_TABLET | Freq: Two times a day (BID) | ORAL | Status: DC
  Administered 2016-11-23 – 2016-11-25 (×5): 1 via ORAL
  Filled 2016-11-23 (×5): qty 1

## 2016-11-23 MED ORDER — ADULT MULTIVITAMIN W/MINERALS CH
1.0000 | ORAL_TABLET | Freq: Every day | ORAL | Status: DC
  Administered 2016-11-23 – 2016-11-25 (×3): 1 via ORAL
  Filled 2016-11-23 (×3): qty 1

## 2016-11-23 MED ORDER — ENSURE ENLIVE PO LIQD
237.0000 mL | Freq: Two times a day (BID) | ORAL | Status: DC
  Administered 2016-11-23 – 2016-11-25 (×5): 237 mL via ORAL

## 2016-11-23 NOTE — Progress Notes (Signed)
Initial Nutrition Assessment  DOCUMENTATION CODES:   Non-severe (moderate) malnutrition in context of chronic illness  INTERVENTION:   Recommend IV iron supplementation   Ensure Enlive po BID, each supplement provides 350 kcal and 20 grams of protein  MVI daily  Yogurt with all meal trays   Liberalize diet   Bowel regimen as needed   NUTRITION DIAGNOSIS:   Moderate Malnutrition related to chronic illness, decreased appetite as evidenced by severe fat depletion, moderate muscle depletion.  GOAL:   Patient will meet greater than or equal to 90% of their needs  MONITOR:   PO intake, Supplement acceptance, Labs, Weight trends, Skin  REASON FOR ASSESSMENT:   LOS    ASSESSMENT:   71 y/o male with right leg gangrene and cellulitis with sepsis present on admission. Now s/p R BKA   Met with pt in room today. Pt reports chronic poor appetite and oral intake pta. Pt reports that "I do the best I can" but states that he only eats about 50% of his meals at home. Pt reports that he likes to snack on foods like yogurt and eggs vs eating large meals. Pt reports his UBW is around 174lbs; It appears that pt has lost about 5lbs but RD is unsure of the time frame. Pt is currently eating 30-100% of his meals.  Pt likes strawberry Ensure; RD will order. RD discussed with pt the importance of adequate protein intake needed for healing. Pt to discharge to [facility] when medically stable.     Medications reviewed and include: allopurinol, augmentin, aspirin, colace, hydromorphone, protonix, morphine  Labs reviewed: Ca 8.2(L), Mg 2.2 wnl Hgb 8.0(L), Hct 25.8(L) Iron- 7(L)- 10/28 B12- 269 WNL- 10/28  Nutrition-Focused physical exam completed. Findings are moderate fat depletions in buccal regions and chest, severe fat depletions in arms, moderate muscle depletions in temporal regions, hands, clavicles, and legs and no edema.   Diet Order:  Diet regular Room service appropriate? Yes; Fluid  consistency: Thin  EDUCATION NEEDS:   Education needs have been addressed  Skin:  Incisions: s/p R BKA  Last BM:  11/5- type 5  Height:   Ht Readings from Last 1 Encounters:  11/22/16 5' 11"  (1.803 m)    Weight:   Wt Readings from Last 1 Encounters:  11/23/16 166 lb (75.3 kg)    Ideal Body Weight:  73.5 kg(adjusted for BKA )  BMI:  Body mass index is 23.15 kg/m.  Estimated Nutritional Needs:   Kcal:  1700-2000kcal/day   Protein:  90-105g/day   Fluid:  >1.8L/day   Koleen Distance MS, RD, LDN Pager #[phone] After Hours Pager: [phone]

## 2016-11-23 NOTE — Anesthesia Postprocedure Evaluation (Signed)
Anesthesia Post Note  Patient: Tristan [name]  Procedure(s) Performed: AMPUTATION BELOW KNEE (Right )  Patient location during evaluation: PACU Anesthesia Type: General Level of consciousness: awake and alert Pain management: pain level controlled Vital Signs Assessment: post-procedure vital signs reviewed and stable Respiratory status: spontaneous breathing, nonlabored ventilation, respiratory function stable and patient connected to nasal cannula oxygen Cardiovascular status: blood pressure returned to baseline and stable Postop Assessment: no apparent nausea or vomiting Anesthetic complications: no     Last Vitals:  Vitals:   11/22/16 2359 11/23/16 0400  BP: 118/69 134/75  Pulse: 94 93  Resp: 19 19  Temp: 37.2 C 37.3 C  SpO2: 98% 98%    Last Pain:  Vitals:   11/23/16 0400  TempSrc: Oral  PainSc:                  Precious Haws Piscitello

## 2016-11-23 NOTE — Progress Notes (Signed)
[facility]  Daily Progress Note   Subjective  - 1 Day Post-Op  Patient with typical pain Straightening knee pretty well No events overnight  Objective Vitals:   11/23/16 0403 11/23/16 0738 11/23/16 1000 11/23/16 1257  BP:  137/74 (!) 105/56 111/66  Pulse:  95 88 86  Resp:  20 20   Temp:  99.4 F (37.4 C) 98.7 F (37.1 C) 98.6 F (37 C)  TempSrc:  Oral Oral Oral  SpO2:  99% 100% 98%  Weight: 75.3 kg (166 lb)     Height:        Intake/Output Summary (Last 24 hours) at 11/23/2016 1431 Last data filed at 11/23/2016 1100 Gross per 24 hour  Intake 1692.5 ml  Output 685 ml  Net 1007.5 ml    PULM  CTAB CV  RRR VASC  Dressing C/D/I  Laboratory CBC    Component Value Date/Time   WBC 8.9 11/23/2016 0337   HGB 8.0 (L) 11/23/2016 0337   HGB 14.6 11/20/2013 0447   HCT 25.8 (L) 11/23/2016 0337   HCT 44.6 11/20/2013 0447   PLT 470 (H) 11/23/2016 0337   PLT 218 11/20/2013 0447    BMET    Component Value Date/Time   NA 136 11/23/2016 0337   NA 140 11/20/2013 0447   K 4.1 11/23/2016 0337   K 4.0 11/20/2013 0447   CL 106 11/23/2016 0337   CL 104 11/20/2013 0447   CO2 22 11/23/2016 0337   CO2 28 11/20/2013 0447   GLUCOSE 121 (H) 11/23/2016 0337   GLUCOSE 83 11/20/2013 0447   BUN 9 11/23/2016 0337   BUN 8 11/20/2013 0447   CREATININE 0.88 11/23/2016 0337   CREATININE 1.18 11/24/2013 0501   CALCIUM 8.2 (L) 11/23/2016 0337   CALCIUM 8.8 11/20/2013 0447   GFRNONAA >60 11/23/2016 0337   GFRNONAA >60 11/24/2013 0501   GFRAA >60 11/23/2016 0337   GFRAA >60 11/24/2013 0501    Assessment/Planning: POD #1 s/p right BKA   Doing well  PT  Push oral intake  Will need rehab at discharge, likely in 2-3 days    Leotis Pain  11/23/2016, 2:31 PM

## 2016-11-23 NOTE — Progress Notes (Signed)
Plan is for patient to D/C to [facility] when medically stable. Clinical Social Worker (CSW) will continue to follow and assist as needed.   [employer], LCSW [phone]

## 2016-11-23 NOTE — Care Management Important Message (Signed)
Important Message  Patient Details  Name: Tristan [name] MRN: [id number] Date of Birth: 09/18/1945   Medicare Important Message Given:  Yes    Marshell Garfinkel, RN 11/23/2016, 10:36 AM

## 2016-11-23 NOTE — Progress Notes (Signed)
OT Cancellation Note  Patient Details Name: Tristan [name] MRN: [id number] DOB: 1945-05-09   Cancelled Treatment:    Reason Eval/Treat Not Completed: Pain limiting ability to participate. Pt POD#1 from R BKA, continue upon transfer orders in place for OT. Upon attempt to re-evaluate pt, pt politely but firmly declining. In too much pain today to participate. Pt continues to state that it's "too soon" after his R BKA yesterday for therapy to start. Pt thankful for the warm blankets that the therapist brought him.  Will re-attempt OT re-evaluation at a later date/time.   Jeni Salles, MPH, MS, OTR/L ascom [phone] 11/23/16, 3:14 PM

## 2016-11-23 NOTE — Progress Notes (Signed)
PT Cancellation Note  Patient Details Name: Tristan [name] MRN: [id number] DOB: [birth year]   Cancelled Treatment:    Reason Eval/Treat Not Completed: Pain limiting ability to participate.  Pt reporting 8/10 R LE amputation site pain and just received some pain meds from nursing.  Pt declining PT at this time d/t pain.  Will re-attempt PT re-evaluation (s/p R BKA 11/22/16) at a later date/time.  Leitha Bleak, PT 11/23/16, 10:02 AM [phone]

## 2016-11-23 NOTE — Progress Notes (Signed)
PT Cancellation Note  Patient Details Name: Tristan [name] MRN: [id number] DOB: [birth year]   Cancelled Treatment:    Reason Eval/Treat Not Completed: Pain limiting ability to participate.  Pt firmly refusing PT and verbalizing that he was not happy that therapist had returned to attempt therapy today (although PT had educated pt this morning that therapist would try back in the afternoon).  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  Nursing notified and aware of above.  Will re-attempt PT re-evaluation at a later date/time.  Leitha Bleak, PT 11/23/16, 2:22 PM [phone]

## 2016-11-23 NOTE — Progress Notes (Signed)
Patient ID: Fabricio Endsley, male   DOB: [birth year], 71 y.o.   MRN: [id number]  [facility] PROGRESS NOTE  Jacey Pelc NUU:[id number] DOB: July 11, 1945 DOA: 11/12/2016 PCP: Patient, No Pcp Per  HPI/Subjective:  Pain at surgical site. No bleeding  Objective: Vitals:   11/23/16 0738 11/23/16 1000  BP: 137/74 (!) 105/56  Pulse: 95 88  Resp: 20 20  Temp: 99.4 F (37.4 C) 98.7 F (37.1 C)  SpO2: 99% 100%    Filed Weights   11/22/16 0425 11/22/16 1259 11/23/16 0403  Weight: 77.1 kg (169 lb 14.4 oz) 76.7 kg (169 lb) 75.3 kg (166 lb)    ROS: Review of Systems  Constitutional: Negative for chills and fever.  Eyes: Negative for blurred vision.  Respiratory: Negative for cough and shortness of breath.   Cardiovascular: Negative for chest pain.  Gastrointestinal: Negative for abdominal pain, constipation, diarrhea, nausea and vomiting.  Genitourinary: Negative for dysuria.  Musculoskeletal: Positive for joint pain.  Neurological: Negative for dizziness and headaches.   Exam: Physical Exam  Constitutional: He is oriented to person, place, and time.  HENT:  Nose: No mucosal edema.  Mouth/Throat: No oropharyngeal exudate or posterior oropharyngeal edema.  Eyes: Conjunctivae, EOM and lids are normal. Pupils are equal, round, and reactive to light.  Neck: No JVD present. Carotid bruit is not present. No edema present. No thyroid mass and no thyromegaly present.  Cardiovascular: S1 normal and S2 normal. Tachycardia present. Exam reveals no gallop.  No murmur heard. Pulses:      Dorsalis pedis pulses are 2+ on the right side, and 2+ on the left side.  Respiratory: No respiratory distress. He has no wheezes. He has no rhonchi. He has no rales.  GI: Soft. Bowel sounds are normal. There is no tenderness.  Musculoskeletal:       Right ankle: He exhibits no swelling.       Left ankle: He exhibits no swelling.  Lymphadenopathy:    He has no cervical adenopathy.   Neurological: He is alert and oriented to person, place, and time. No cranial nerve deficit.  Skin: Skin is warm. Nails show no clubbing.  Psychiatric: He has a normal mood and affect.  Right BKA with dressing. No bleeding    Data Reviewed: Basic Metabolic Panel: Recent Labs  Lab 11/18/16 0416 11/21/16 0525 11/22/16 0448 11/23/16 0337  NA  --   --  135 136  K  --   --  4.1 4.1  CL  --   --  105 106  CO2  --   --  24 22  GLUCOSE  --   --  100* 121*  BUN  --   --  9 9  CREATININE 0.96 0.96 0.91 0.88  CALCIUM  --   --  8.4* 8.2*  MG  --   --  2.2  --    Liver Function Tests: No results for input(s): AST, ALT, ALKPHOS, BILITOT, PROT, ALBUMIN in the last 168 hours. CBC: Recent Labs  Lab 11/17/16 0350 11/18/16 0416 11/21/16 0525 11/22/16 0448 11/23/16 0337  WBC 11.1* 10.6 8.4 7.7 8.9  HGB 7.8* 8.9* 8.5* 8.5* 8.0*  HCT 26.1* 29.0* 28.1* 28.0* 25.8*  MCV 70.0* 71.9* 72.5* 72.6* 73.6*  PLT 612* 568* 487* 508* 470*   BNP (last 3 results) Recent Labs    06/08/16 1505  BNP 3,078.0*    CBG: Recent Labs  Lab 11/19/16 1153 11/20/16 0825 11/21/16 0745 11/22/16 0740 11/23/16 0741  GLUCAP 115*  112* 120* 94 114*    Recent Results (from the past 240 hour(s))  Surgical PCR screen     Status: None   Collection Time: 11/21/16  5:56 PM  Result Value Ref Range Status   MRSA, PCR NEGATIVE NEGATIVE Final   Staphylococcus aureus NEGATIVE NEGATIVE Final    Comment: (NOTE) The Xpert SA Assay (FDA approved for NASAL specimens in patients 7 years of age and older), is one component of a comprehensive surveillance program. It is not intended to diagnose infection nor to guide or monitor treatment.       Scheduled Meds: . allopurinol  100 mg Oral Daily  . amoxicillin-clavulanate  1 tablet Oral Q12H  . aspirin EC  81 mg Oral Daily  . atorvastatin  20 mg Oral q1800  . carvedilol  25 mg Oral BID WC  . docusate sodium  100 mg Oral BID  .  HYDROmorphone (DILAUDID)  injection  1 mg Intravenous Once  . pantoprazole  40 mg Oral BID AC  . sodium chloride flush  3 mL Intravenous Q12H   Continuous Infusions:   Assessment/Plan:  1. 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  Will need amputation.  Scheduled for tomorrow. Was on IV vanc and Zosyn. We will stop Zosyn today now that amputation is done.  Change to Augmentin.  2. PAD with gangrene.  Status post angiogram and angioplasties and stents placed.   Off eliquis  3. Acute on chronic anemia.  EGD showing gastritis.  The patient has been transfused 4 units of red blood cells and IV iron.  4. Hb stable today  5. HTN - continue medications 6. Acute kidney injury - resolved 7. Severe cardiomyopathy and severe aortic stenosis.  No signs of Fluid overload. 8. Gout - on allopurinol   Code Status:     Code Status Orders        Start     Ordered   11/12/16 1811  Full code  Continuous     11/12/16 1810    Code Status History    Date Active Date Inactive Code Status Order ID Comments User Context   06/08/2016  2:32 PM 06/12/2016  7:47 PM Partial Code [id number]  Vaughan Basta, MD Inpatient    Advance Directive Documentation     Most Recent Value  Type of Advance Directive  Healthcare Power of Attorney, Living will  Pre-existing out of facility DNR order (yellow form or pink MOST form)  -  "MOST" Form in Place?  -     Disposition Plan: To be determined  Consultants:  Vascular surgery  Cardiology  Procedures:  Angiogram  Antibiotics:  Zosyn  Time spent: 30 minutes  Shaasia Odle, [employer]

## 2016-11-24 LAB — METHYLMALONIC ACID, SERUM: Methylmalonic Acid, Quantitative: 530 nmol/L — ABNORMAL HIGH (ref 0–378)

## 2016-11-24 LAB — SURGICAL PATHOLOGY

## 2016-11-24 LAB — GLUCOSE, CAPILLARY: GLUCOSE-CAPILLARY: 127 mg/dL — AB (ref 65–99)

## 2016-11-24 MED ORDER — OXYCODONE-ACETAMINOPHEN 7.5-325 MG PO TABS
1.0000 | ORAL_TABLET | ORAL | Status: DC | PRN
  Administered 2016-11-24 – 2016-11-25 (×3): 1 via ORAL
  Filled 2016-11-24 (×3): qty 1

## 2016-11-24 MED ORDER — FERROUS SULFATE 325 (65 FE) MG PO TABS
325.0000 mg | ORAL_TABLET | Freq: Two times a day (BID) | ORAL | Status: DC
  Administered 2016-11-24 – 2016-11-25 (×2): 325 mg via ORAL
  Filled 2016-11-24 (×2): qty 1

## 2016-11-24 NOTE — Progress Notes (Signed)
[facility]  Daily Progress Note   Subjective  - 2 Days Post-Op  Doing better.  Up in a chair Straightening knee well  Objective Vitals:   11/23/16 2000 11/24/16 0500 11/24/16 0740 11/24/16 1123  BP: 111/62  123/68 (!) 104/54  Pulse: 78  91 92  Resp: 16  20 20   Temp: 99.4 F (37.4 C)  99.4 F (37.4 C) 98.6 F (37 C)  TempSrc: Oral  Oral Oral  SpO2: 100%  99% 100%  Weight:  76.4 kg (168 lb 8 oz)    Height:        Intake/Output Summary (Last 24 hours) at 11/24/2016 1604 Last data filed at 11/24/2016 1428 Gross per 24 hour  Intake 483 ml  Output 850 ml  Net -367 ml    PULM  CTAB CV  RRR VASC  Dressing C/D/I  Laboratory CBC    Component Value Date/Time   WBC 8.9 11/23/2016 0337   HGB 8.0 (L) 11/23/2016 0337   HGB 14.6 11/20/2013 0447   HCT 25.8 (L) 11/23/2016 0337   HCT 44.6 11/20/2013 0447   PLT 470 (H) 11/23/2016 0337   PLT 218 11/20/2013 0447    BMET    Component Value Date/Time   NA 136 11/23/2016 0337   NA 140 11/20/2013 0447   K 4.1 11/23/2016 0337   K 4.0 11/20/2013 0447   CL 106 11/23/2016 0337   CL 104 11/20/2013 0447   CO2 22 11/23/2016 0337   CO2 28 11/20/2013 0447   GLUCOSE 121 (H) 11/23/2016 0337   GLUCOSE 83 11/20/2013 0447   BUN 9 11/23/2016 0337   BUN 8 11/20/2013 0447   CREATININE 0.88 11/23/2016 0337   CREATININE 1.18 11/24/2013 0501   CALCIUM 8.2 (L) 11/23/2016 0337   CALCIUM 8.8 11/20/2013 0447   GFRNONAA >60 11/23/2016 0337   GFRNONAA >60 11/24/2013 0501   GFRAA >60 11/23/2016 0337   GFRAA >60 11/24/2013 0501    Assessment/Planning: POD #2 s/p right BKA   Doing well thus far.  Dressing can be removed tomorrow and just a Kerlix wrap with Xeroform replaced.  Should be stable to go to rehab tomorrow and follow-up with me in the office in 2-3 weeks.    Leotis Pain  11/24/2016, 4:04 PM

## 2016-11-24 NOTE — Evaluation (Signed)
Physical Therapy ReEvaluation Patient Details Name: Orby Tangen MRN: [id number] DOB: 28-Jul-1945 Today's Date: 11/24/2016   History of Present Illness  71 y.o. male who was admitted 10/28 with a history of iron deficiency anemia who was admitted with symptomatic anemia and right lower extremity cellulitis.  Blood transfusion 11/2, RLE revascularization 10/31. Pt. underwent a BKA on 11/22/2016.  Clinical Impression  Pt was able to participate with PT re-eval post R BKA, but was limited due to severe pain and ultimately did not tolerate a lot of movement.  He struggled with transfer from bed to recliner and c/o phantom pain t/o most of the session.  He was pleasant and eager to do what he could, again he was just very limited.  Pt too tired and in pain aftter getting to the recliner to do any exercises.     Follow Up Recommendations SNF    Equipment Recommendations  Rolling walker with 5" wheels    Recommendations for Other Services       Precautions / Restrictions Precautions Precautions: Fall Restrictions Weight Bearing Restrictions: Yes RLE Weight Bearing: (recent BKA, NWBing)      Mobility  Bed Mobility Overal bed mobility: Needs Assistance Bed Mobility: Supine to Sit     Supine to sit: Min guard     General bed mobility comments: Pt able to get himself up to sitting EOB with considerable effort/time and some assist with UEs to move R LE  Transfers Overall transfer level: Needs assistance Equipment used: Rolling walker (2 wheeled) Transfers: Sit to/from Stand Sit to Stand: Mod assist         General transfer comment: Pt very hesitant secondary to fear of pain.  Overall he showed good effort with mobility but needed considerable assist to initiate movement and to ultimately get himself to fully upright  Ambulation/Gait Ambulation/Gait assistance: Min assist;Mod assist Ambulation Distance (Feet): 3 Feet Assistive device: Rolling walker (2 wheeled)        General Gait Details: Pt was able to do a few heel-toe shuffle "steps" to the recliner with heavy reliance on the walker.  Ultimately very limited with standing mobility  Stairs            Wheelchair Mobility    Modified Rankin (Stroke Patients Only)       Balance Overall balance assessment: Needs assistance   Sitting balance-Leahy Scale: Good     Standing balance support: Bilateral upper extremity supported;During functional activity Standing balance-Leahy Scale: Poor Standing balance comment: Pt very reliant on the walker to stay upright, needed close assist t/o time standing                             Pertinent Vitals/Pain Pain Assessment: 0-10 Pain Score: 8  Pain Location: c/o intermittent severe phantom R limb pain    Home Living Family/patient expects to be discharged to:: Skilled nursing facility Living Arrangements: Alone   Type of Home: Apartment Home Access: Stairs to enter Entrance Stairs-Rails: None Entrance Stairs-Number of Steps: 2 Home Layout: One level Home Equipment: Crutches;Walker - 2 wheels;Wheelchair - manual;Shower seat      Prior Function Level of Independence: Needs assistance   Gait / Transfers Assistance Needed: was mod indep w/ HH mobility using axillary crutches until approx 2 wks ago due to RLE pain, has been using the w/c since  ADL's / Homemaking Assistance Needed: Pt. was independent with ADLs, and  performed showers while seated. Pt. son  or cousin drives him to appts/groceries, increasing difficulty noted for tub transfers and toilet transfers per pt report        Hand Dominance   Dominant Hand: (Ambidrextrous)    Extremity/Trunk Assessment   Upper Extremity Assessment Upper Extremity Assessment: Defer to OT evaluation    Lower Extremity Assessment Lower Extremity Assessment: (L grossly 4/5) RLE Deficits / Details: significant pain, overall showed R hip strength for 3+/5, minimal tolerance of knee  flex/ext secondary to pain       Communication   Communication: No difficulties  Cognition Arousal/Alertness: Awake/alert Behavior During Therapy: WFL for tasks assessed/performed Overall Cognitive Status: Within Functional Limits for tasks assessed                                        General Comments      Exercises     Assessment/Plan    PT Assessment Patient needs continued PT services  PT Problem List Decreased strength;Decreased activity tolerance;Decreased balance;Decreased mobility;Pain       PT Treatment Interventions Gait training;DME instruction;Therapeutic exercise    PT Goals (Current goals can be found in the Care Plan section)  Acute Rehab PT Goals Patient Stated Goal: go to rehab when he's ready PT Goal Formulation: With patient Time For Goal Achievement: 12/08/16 Potential to Achieve Goals: Fair    Frequency 7X/week   Barriers to discharge        Co-evaluation               AM-PAC PT "6 Clicks" Daily Activity  Outcome Measure Difficulty turning over in bed (including adjusting bedclothes, sheets and blankets)?: A Little Difficulty moving from lying on back to sitting on the side of the bed? : A Little Difficulty sitting down on and standing up from a chair with arms (e.g., wheelchair, bedside commode, etc,.)?: Unable Help needed moving to and from a bed to chair (including a wheelchair)?: A Lot Help needed walking in hospital room?: Total Help needed climbing 3-5 steps with a railing? : Total 6 Click Score: 11    End of Session Equipment Utilized During Treatment: Gait belt Activity Tolerance: Patient limited by pain Patient left: in chair;with chair alarm set;with call bell/phone within reach   PT Visit Diagnosis: Unsteadiness on feet (R26.81);Muscle weakness (generalized) (M62.81);Difficulty in walking, not elsewhere classified (R26.2);Pain Pain - Right/Left: Right Pain - part of body: Leg    Time: 3329-5188 PT  Time Calculation (min) (ACUTE ONLY): 25 min   Charges:   PT Evaluation $PT Re-evaluation: 1 Re-eval     PT G Codes:   PT G-Codes **NOT FOR INPATIENT CLASS** Functional Assessment Tool Used: AM-PAC 6 Clicks Basic Mobility Functional Limitation: Mobility: Walking and moving around Mobility: Walking and Moving Around Current Status (C1660): At least 60 percent but less than 80 percent impaired, limited or restricted Mobility: Walking and Moving Around Goal Status [phone]): At least 40 percent but less than 60 percent impaired, limited or restricted    Kreg Shropshire, DPT 11/24/2016, 5:03 PM

## 2016-11-24 NOTE — Evaluation (Signed)
Occupational Therapy Evaluation Patient Details Name: Tristan [name] MRN: [id number] DOB: 1945-12-01 Today's Date: 11/24/2016    History of Present Illness 71 y.o. male who was admitted 10/28 with a history of iron deficiency anemia who was admitted with symptomatic anemia and right lower extremity cellulitis.  Blood transfusion 11/2, RLE revascularization 10/31. Pt. underwent a BKA on 11/22/2016.   Clinical Impression   Pt. continues to present with 3/10 phantom limb pain, weakness, impaired balance, and limited functional mobility which continues to limit his ability to complete basic ADL, and IADL tasks. Pt. Continues to require assistance with ADLs, and IADLs. Pt. education was provided about positioning, work simplification strategies, and general A/E, and DME. Pt. Continues to benefit from skilled oT services for ADL training, A/E training, functional mobility for ADLs, and pt. education about home modification, and DME. Pt. Plans to go to SNF at [facility] upon discharge. Pt. Could benefit from follow-up OT services.     Follow Up Recommendations  SNF    Equipment Recommendations  3 in 1 bedside commode;Toilet rise with handles    Recommendations for Other Services       Precautions / Restrictions               ADL either performed or assessed with clinical judgement   ADL Overall ADL's : Needs assistance/impaired Eating/Feeding: Set up   Grooming: Set up   Upper Body Bathing: Set up;Supervision/ safety   Lower Body Bathing: Maximal assistance   Upper Body Dressing : Set up   Lower Body Dressing: Maximal assistance               Functional mobility during ADLs: (deferred)       Vision         Perception     Praxis      Pertinent Vitals/Pain Pain Assessment: 0-10 Pain Score: 3  Pain Location: Phantom limb pain     Hand Dominance (Ambidrextrous)   Extremity/Trunk Assessment Upper Extremity Assessment Upper Extremity Assessment:  Generalized weakness(Pt. has an old right RTC Injury)           Communication Communication Communication: No difficulties   Cognition Arousal/Alertness: Awake/alert Behavior During Therapy: WFL for tasks assessed/performed Overall Cognitive Status: Within Functional Limits for tasks assessed                                     General Comments       Exercises     Shoulder Instructions      Home Living Family/patient expects to be discharged to:: Private residence Living Arrangements: Alone   Type of Home: Apartment Home Access: Stairs to enter [employer] of Steps: 2 Entrance Stairs-Rails: None Home Layout: One level     Bathroom Shower/Tub: Teacher, early years/pre: Standard     Home Equipment: Crutches;Walker - 2 wheels;Wheelchair - manual;Shower seat          Prior Functioning/Environment Level of Independence: Needs assistance  Gait / Transfers Assistance Needed: was mod indep w/ HH mobility using axillary crutches until approx 2 wks ago due to RLE pain, has been using the w/c since ADL's / Homemaking Assistance Needed: Pt. was independent with ADLs, and  performed showers while seated. Pt. son or cousin drives him to appts/groceries, increasing difficulty noted for tub transfers and toilet transfers per pt report  OT Problem List:        OT Treatment/Interventions: Self-care/ADL training;Therapeutic exercise;DME and/or AE instruction;Patient/family education    OT Goals(Current goals can be found in the care plan section) Acute Rehab OT Goals Patient Stated Goal: To go to rehab OT Goal Formulation: With patient Potential to Achieve Goals: Good  OT Frequency: Min 1X/week   Barriers to D/C:            Co-evaluation              AM-PAC PT "6 Clicks" Daily Activity     Outcome Measure Help from another person eating meals?: None Help from another person taking care of personal grooming?:  None Help from another person toileting, which includes using toliet, bedpan, or urinal?: A Lot Help from another person bathing (including washing, rinsing, drying)?: A Lot Help from another person to put on and taking off regular upper body clothing?: A Little Help from another person to put on and taking off regular lower body clothing?: A Lot 6 Click Score: 17   End of Session    Activity Tolerance: Patient tolerated treatment well Patient left: in chair;with call bell/phone within reach;with chair alarm set  OT Visit Diagnosis: Other abnormalities of gait and mobility (R26.89);Muscle weakness (generalized) (M62.81);Pain                Time: 1345-1410 OT Time Calculation (min): 25 min Charges:  OT General Charges $OT Visit: 1 Visit OT Evaluation $OT Re-eval: 1 Re-eval G-Codes: OT G-codes **NOT FOR INPATIENT CLASS** Functional Limitation: Self care Self Care Current Status (A1655): At least 40 percent but less than 60 percent impaired, limited or restricted Self Care Goal Status (V7482): At least 20 percent but less than 40 percent impaired, limited or restricted   Harrel Carina, MS, OTR/L   Harrel Carina, MS, OTR/L 11/24/2016, 4:05 PM

## 2016-11-24 NOTE — Plan of Care (Signed)
Pt. Will be modified independent with LE dressing. Pt. Will be independent with UE dressing. Pt. Will be modified independent with toileting tasks.

## 2016-11-24 NOTE — Progress Notes (Signed)
Plan is for patient to D/C to [facility] when medically stable. [facility] liaison is aware of above. Clinical Social Worker (CSW) will continue to follow and assist as needed.   [employer], LCSW [phone]

## 2016-11-24 NOTE — Progress Notes (Signed)
Patient ID: Tristan [name], male   DOB: [birth year], 71 y.o.   MRN: [id number]  [facility] PROGRESS NOTE  Jaeshawn Silvio VZS:[id number] DOB: 04-03-1945 DOA: 11/12/2016 PCP: Patient, No Pcp Per  HPI/Subjective:  Pain at surgical site. No bleeding  Objective: Vitals:   11/24/16 0740 11/24/16 1123  BP: 123/68 (!) 104/54  Pulse: 91 92  Resp: 20 20  Temp: 99.4 F (37.4 C) 98.6 F (37 C)  SpO2: 99% 100%    Filed Weights   11/22/16 1259 11/23/16 0403 11/24/16 0500  Weight: 76.7 kg (169 lb) 75.3 kg (166 lb) 76.4 kg (168 lb 8 oz)    ROS: Review of Systems  Constitutional: Negative for chills and fever.  Eyes: Negative for blurred vision.  Respiratory: Negative for cough and shortness of breath.   Cardiovascular: Negative for chest pain.  Gastrointestinal: Negative for abdominal pain, constipation, diarrhea, nausea and vomiting.  Genitourinary: Negative for dysuria.  Musculoskeletal: Positive for joint pain.  Neurological: Negative for dizziness and headaches.   Exam: Physical Exam  Constitutional: He is oriented to person, place, and time.  HENT:  Nose: No mucosal edema.  Mouth/Throat: No oropharyngeal exudate or posterior oropharyngeal edema.  Eyes: Conjunctivae, EOM and lids are normal. Pupils are equal, round, and reactive to light.  Neck: No JVD present. Carotid bruit is not present. No edema present. No thyroid mass and no thyromegaly present.  Cardiovascular: S1 normal and S2 normal. Tachycardia present. Exam reveals no gallop.  No murmur heard. Pulses:      Dorsalis pedis pulses are 2+ on the right side, and 2+ on the left side.  Respiratory: No respiratory distress. He has no wheezes. He has no rhonchi. He has no rales.  GI: Soft. Bowel sounds are normal. There is no tenderness.  Musculoskeletal:       Right ankle: He exhibits no swelling.       Left ankle: He exhibits no swelling.  Lymphadenopathy:    He has no cervical adenopathy.  Neurological:  He is alert and oriented to person, place, and time. No cranial nerve deficit.  Skin: Skin is warm. Nails show no clubbing.  Psychiatric: He has a normal mood and affect.  Right BKA with dressing. No bleeding    Data Reviewed: Basic Metabolic Panel: Recent Labs  Lab 11/18/16 0416 11/21/16 0525 11/22/16 0448 11/23/16 0337  NA  --   --  135 136  K  --   --  4.1 4.1  CL  --   --  105 106  CO2  --   --  24 22  GLUCOSE  --   --  100* 121*  BUN  --   --  9 9  CREATININE 0.96 0.96 0.91 0.88  CALCIUM  --   --  8.4* 8.2*  MG  --   --  2.2  --    Liver Function Tests: No results for input(s): AST, ALT, ALKPHOS, BILITOT, PROT, ALBUMIN in the last 168 hours. CBC: Recent Labs  Lab 11/18/16 0416 11/21/16 0525 11/22/16 0448 11/23/16 0337  WBC 10.6 8.4 7.7 8.9  HGB 8.9* 8.5* 8.5* 8.0*  HCT 29.0* 28.1* 28.0* 25.8*  MCV 71.9* 72.5* 72.6* 73.6*  PLT 568* 487* 508* 470*   BNP (last 3 results) Recent Labs    06/08/16 1505  BNP 3,078.0*    CBG: Recent Labs  Lab 11/20/16 0825 11/21/16 0745 11/22/16 0740 11/23/16 0741 11/24/16 0916  GLUCAP 112* 120* 94 114* 127*  Recent Results (from the past 240 hour(s))  Surgical PCR screen     Status: None   Collection Time: 11/21/16  5:56 PM  Result Value Ref Range Status   MRSA, PCR NEGATIVE NEGATIVE Final   Staphylococcus aureus NEGATIVE NEGATIVE Final    Comment: (NOTE) The Xpert SA Assay (FDA approved for NASAL specimens in patients 34 years of age and older), is one component of a comprehensive surveillance program. It is not intended to diagnose infection nor to guide or monitor treatment.       Scheduled Meds: . allopurinol  100 mg Oral Daily  . amoxicillin-clavulanate  1 tablet Oral Q12H  . aspirin EC  81 mg Oral Daily  . atorvastatin  20 mg Oral q1800  . carvedilol  25 mg Oral BID WC  . docusate sodium  100 mg Oral BID  . feeding supplement (ENSURE ENLIVE)  237 mL Oral BID BM  . ferrous sulfate  325 mg Oral BID  WC  .  HYDROmorphone (DILAUDID) injection  1 mg Intravenous Once  . multivitamin with minerals  1 tablet Oral Daily  . pantoprazole  40 mg Oral BID AC  . sodium chloride flush  3 mL Intravenous Q12H   Continuous Infusions:   Assessment/Plan:  1. 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  Will need amputation.  Scheduled for tomorrow. Was on IV vanc and Zosyn. Changed to Augmentin after amputation.  If no signs of infection on dressing change would stop antibiotics.  2. PAD with gangrene.  Status post angiogram and angioplasties and stents placed.   Off eliquis  3. Acute on chronic anemia.  EGD showing gastritis.  The patient has been transfused 4 units of red blood cells and IV iron.  Hb stable today  4. HTN - continue medications 5. Acute kidney injury - resolved 6. Severe cardiomyopathy and severe aortic stenosis.  No signs of Fluid overload. 7. Gout - on allopurinol  D/C tomorrow after dressing change. Discussed with Dr. Lucky Cowboy   Code Status:     Code Status Orders        Start     Ordered   11/12/16 1811  Full code  Continuous     11/12/16 1810    Code Status History    Date Active Date Inactive Code Status Order ID Comments User Context   06/08/2016  2:32 PM 06/12/2016  7:47 PM Partial Code [id number]  Vaughan Basta, MD Inpatient    Advance Directive Documentation     Most Recent Value  Type of Advance Directive  Healthcare Power of Attorney, Living will  Pre-existing out of facility DNR order (yellow form or pink MOST form)  -  "MOST" Form in Place?  -     Disposition Plan: To be determined  Consultants:  Vascular surgery  Cardiology  Procedures:  Angiogram  Antibiotics:  Zosyn  Time spent: 30 minutes  Anyla Israelson, [employer]

## 2016-11-25 LAB — GLUCOSE, CAPILLARY: Glucose-Capillary: 107 mg/dL — ABNORMAL HIGH (ref 65–99)

## 2016-11-25 MED ORDER — BISACODYL 10 MG RE SUPP: 10.0000 mg | Freq: Every day | RECTAL | 0 refills | Status: AC | PRN

## 2016-11-25 MED ORDER — DOCUSATE SODIUM 100 MG PO CAPS
100.0000 mg | ORAL_CAPSULE | Freq: Two times a day (BID) | ORAL | 0 refills | Status: AC
Start: 2016-11-25 — End: ?

## 2016-11-25 MED ORDER — CARVEDILOL 25 MG PO TABS: 25.0000 mg | ORAL_TABLET | Freq: Two times a day (BID) | ORAL | 0 refills | Status: AC

## 2016-11-25 MED ORDER — OXYCODONE-ACETAMINOPHEN 7.5-325 MG PO TABS: 1.0000 | ORAL_TABLET | ORAL | 0 refills | Status: AC | PRN

## 2016-11-25 MED ORDER — ONDANSETRON HCL 4 MG PO TABS: 4.0000 mg | ORAL_TABLET | Freq: Four times a day (QID) | ORAL | 0 refills | Status: AC | PRN

## 2016-11-25 MED ORDER — ADULT MULTIVITAMIN W/MINERALS CH: 1.0000 | ORAL_TABLET | Freq: Every day | ORAL | 0 refills | Status: AC

## 2016-11-25 MED ORDER — ATORVASTATIN CALCIUM 20 MG PO TABS: 20.0000 mg | ORAL_TABLET | Freq: Every day | ORAL | 0 refills | Status: AC

## 2016-11-25 MED ORDER — FERROUS SULFATE 325 (65 FE) MG PO TABS: 325.0000 mg | ORAL_TABLET | Freq: Two times a day (BID) | ORAL | 0 refills | Status: AC

## 2016-11-25 MED ORDER — ENSURE ENLIVE PO LIQD: 237.0000 mL | Freq: Two times a day (BID) | ORAL | 0 refills | Status: AC

## 2016-11-25 MED ORDER — ACETAMINOPHEN 325 MG PO TABS: 650.0000 mg | ORAL_TABLET | Freq: Four times a day (QID) | ORAL | Status: AC | PRN

## 2016-11-25 MED ORDER — PANTOPRAZOLE SODIUM 40 MG PO TBEC: 40.0000 mg | DELAYED_RELEASE_TABLET | Freq: Two times a day (BID) | ORAL | 0 refills | Status: AC

## 2016-11-25 NOTE — Discharge Summary (Signed)
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 Hypertension     HOSPITAL COURSE:   1.  Right ankle and foot gangrene with cellulitis and sepsis present on admission.  Patient had angiogram to try to get better blood supply to the right lower extremity.  The patient was on vancomycin and Zosyn during the hospital course and switched over to oral Augmentin.  Patient had a right BKA by Dr. patient will have a dressing change today by  vascular surgery.  And if wound looks okay he will be discharged to rehab.  If physical therapy can do weightbearing with the left lower extremity with either walker or crutches. 2.  PAD with gangrene.  He is off Eliquis at this time secondary to repeated anemia requiring transfusions.  He was restarted on aspirin alone. 3.  Acute on chronic anemia.  EGD showing gastritis.  The patient has been transfused 4. units of packed red blood cells during the hospital course and given IV iron.  Hemoglobin 8.0.  Continue oral iron as outpatient.  Recommend checking a hemoglobin every 2 weeks.  Continue Protonix twice daily for 1 month and then daily afterwards. 5.  Essential hypertension tachycardia restarted Coreg at higher dose 6.  Acute kidney injury resolved 7.  Severe cardiomyopathy and severe aortic stenosis.  No signs of fluid overload.  Restarted Lasix upon going home.  On Coreg.  With  severe aortic stenosis ACE inhibitor or arb is contraindicated. 8.  History of gout on allopurinol   DISCHARGE CONDITIONS:  Satisfactory  CONSULTS OBTAINED:  Treatment Team:  Samara Deist, DPM Lequita Asal, MD Schnier, Dolores Lory, MD  DRUG ALLERGIES:  No Known Allergies  DISCHARGE MEDICATIONS:   Current Discharge Medication List    START taking these medications   Details  acetaminophen (TYLENOL) 325 MG tablet Take 2 tablets (650 mg total) every 6 (six) hours as needed by mouth for mild pain (or Fever >/= 101).    atorvastatin (LIPITOR) 20 MG tablet Take 1 tablet (20 mg total) daily at 6 PM by mouth. Qty: 30 tablet, Refills: 0    bisacodyl (DULCOLAX) 10 MG suppository Place 1 suppository (10 mg total) daily as needed rectally for moderate constipation. Qty: 12 suppository, Refills: 0    docusate sodium (COLACE) 100 MG capsule Take 1 capsule (100 mg total) 2 (two) times daily by mouth. Qty: 60 capsule, Refills: 0    feeding supplement, ENSURE ENLIVE, (ENSURE ENLIVE) LIQD Take 237 mLs 2 (two) times daily between meals by mouth. Qty: 60 Bottle, Refills: 0    ferrous sulfate 325 (65 FE) MG tablet Take 1 tablet (325 mg total) 2 (two) times daily with a meal by mouth. Qty: 60 tablet, Refills: 0    Multiple Vitamin (MULTIVITAMIN WITH MINERALS) TABS tablet Take 1 tablet daily by mouth. Qty: 30 tablet, Refills: 0    ondansetron (ZOFRAN) 4 MG  tablet Take 1 tablet (4 mg total) every 6 (six) hours as needed by mouth for nausea. Qty: 20 tablet, Refills: 0    oxyCODONE-acetaminophen (PERCOCET) 7.5-325 MG tablet Take 1 tablet every 4 (four) hours as needed by mouth for moderate pain. Qty: 30 tablet, Refills: 0      CONTINUE these medications which have CHANGED   Details  carvedilol (COREG) 25 MG tablet Take 1 tablet (25 mg total) 2 (two) times daily with a meal by mouth. Qty: 60 tablet, Refills: 0    pantoprazole (PROTONIX) 40 MG tablet Take 1 tablet (40 mg total) 2  (two) times daily before a meal by mouth. Qty: 60 tablet, Refills: 0      CONTINUE these medications which have NOT CHANGED   Details  allopurinol (ZYLOPRIM) 100 MG tablet Take 1 tablet (100 mg total) by mouth daily. Qty: 30 tablet, Refills: 1    aspirin EC 81 MG tablet Take 1 tablet (81 mg total) by mouth daily. Qty: 30 tablet, Refills: 2    furosemide (LASIX) 20 MG tablet Take 20 mg by mouth daily.      STOP taking these medications     losartan (COZAAR) 25 MG tablet      cephALEXin (KEFLEX) 500 MG capsule          DISCHARGE INSTRUCTIONS:   Follow-up with Dr. Lucky Cowboy in a few weeks Follow-up Dr. at rehab 1 day  If you experience worsening of your admission symptoms, develop shortness of breath, life threatening emergency, suicidal or homicidal thoughts you must seek medical attention immediately by calling 911 or calling your MD immediately  if symptoms less severe.  You Must read complete instructions/literature along with all the possible adverse reactions/side effects for all the Medicines you take and that have been prescribed to you. Take any new Medicines after you have completely understood and accept all the possible adverse reactions/side effects.   Please note  You were cared for by a hospitalist during your hospital stay. If you have any questions about your discharge medications or the care you received while you were in the hospital after you are discharged, you can call the unit and asked to speak with the hospitalist on call if the hospitalist that took care of you is not available. Once you are discharged, your primary care physician will handle any further medical issues. Please note that NO REFILLS for any discharge medications will be authorized once you are discharged, as it is imperative that you return to your primary care physician (or establish a relationship with a primary care physician if you do not have one) for your aftercare needs so that they can  reassess your need for medications and monitor your lab values.    Today   CHIEF COMPLAINT:   Chief Complaint  Patient presents with  . Foot Pain    HISTORY OF PRESENT ILLNESS:  Ole Marrow is a 71 year old male that came in with foot pain and had gangrene on his right lower extremity   VITAL SIGNS:  Blood pressure (!) 104/57, pulse 95, temperature 98.8 F (37.1 C), temperature source Oral, resp. rate 20, height 5\' 11"  (1.803 m), weight 76.4 kg (168 lb 8 oz), SpO2 100 %.    PHYSICAL EXAMINATION:  GENERAL:  71 y.o.-year-old patient lying in the bed with no acute distress.  EYES: Pupils equal, round, reactive to light and accommodation. No scleral icterus. Extraocular muscles intact.  HEENT: Head atraumatic, normocephalic. Oropharynx and nasopharynx clear.  NECK:  Supple, no jugular venous distention. No thyroid enlargement, no tenderness.  LUNGS: Normal breath sounds bilaterally, no wheezing, rales,rhonchi or crepitation. No use of accessory muscles of respiration.  CARDIOVASCULAR: S1, S2 normal. No murmurs, rubs, or gallops.  ABDOMEN: Soft, non-tender, non-distended. Bowel sounds present. No organomegaly or mass.  EXTREMITIES: No pedal edema, cyanosis, or clubbing.  NEUROLOGIC: Cranial nerves II through XII are intact. Muscle strength 5/5 in all extremities. Sensation intact. Gait not checked.  PSYCHIATRIC: The patient is alert and oriented x 3.  SKIN: Right leg surgical site covered  DATA REVIEW:   CBC Recent Labs  Lab 11/23/16 0337  WBC 8.9  HGB 8.0*  HCT 25.8*  PLT 470*    Chemistries  Recent Labs  Lab 11/22/16 0448 11/23/16 0337  NA 135 136  K 4.1 4.1  CL 105 106  CO2 24 22  GLUCOSE 100* 121*  BUN 9 9  CREATININE 0.91 0.88  CALCIUM 8.4* 8.2*  MG 2.2  --      Microbiology Results  Results for orders placed or performed during the hospital encounter of 11/12/16  Wound or Superficial Culture     Status: Abnormal   Collection Time: 11/12/16  3:48 PM   Result Value Ref Range Status   Specimen Description LEG PER RN ALICIA LEG ULCER  Final   Special Requests NONE  Final   Gram Stain   Final    RARE WBC PRESENT, PREDOMINANTLY PMN MODERATE GRAM NEGATIVE RODS MODERATE GRAM POSITIVE COCCI Performed at [facility], [place] [address] [place], [place] [address]    Culture MULTIPLE ORGANISMS PRESENT, NONE PREDOMINANT (A)  Final   Report Status 11/15/2016 FINAL  Final  Surgical PCR screen     Status: None   Collection Time: 11/21/16  5:56 PM  Result Value Ref Range Status   MRSA, PCR NEGATIVE NEGATIVE Final   Staphylococcus aureus NEGATIVE NEGATIVE Final    Comment: (NOTE) The Xpert SA Assay (FDA approved for NASAL specimens in patients 6 years of age and older), is one component of a comprehensive surveillance program. It is not intended to diagnose infection nor to guide or monitor treatment.        Management plans discussed with the patient, and he is in agreement.  CODE STATUS:     Code Status Orders  (From admission, onward)        Start     Ordered   11/12/16 1811  Full code  Continuous     11/12/16 1810    Code Status History    Date Active Date Inactive Code Status Order ID Comments User Context   06/08/2016 14:32 06/12/2016 19:47 Partial Code [id number]  Vaughan Basta, MD Inpatient    Advance Directive Documentation     Most Recent Value  Type of Advance Directive  Healthcare Power of Attorney, Living will  Pre-existing out of facility DNR order (yellow form or pink MOST form)  No data  "MOST" Form in Place?  No data      TOTAL TIME TAKING CARE OF THIS PATIENT: 35 minutes.    Loletha Grayer M.D on 11/25/2016 at 8:44 AM  Between 7am to 6pm - Pager - [phone]  After 6pm go to [email or web address] - password [email or web address] [facility]  [facility] Office  [phone]  CC: Primary care physician; Patient, No Pcp Per

## 2016-11-25 NOTE — Clinical Social Work Placement (Signed)
   CLINICAL SOCIAL WORK PLACEMENT  NOTE  Date:  11/25/2016  Patient Details  Name: Tristan [name] MRN: [id number] Date of Birth: 01/25/1945  Clinical Social Work is seeking post-discharge placement for this patient at the [place] level of care (*CSW will initial, date and re-position this form in  chart as items are completed):  Yes   Patient/family provided with [facility] Work Department's list of facilities offering this level of care within the geographic area requested by the patient (or if unable, by the patient's family).  Yes   Patient/family informed of their freedom to choose among providers that offer the needed level of care, that participate in Medicare, Medicaid or managed care program needed by the patient, have an available bed and are willing to accept the patient.  Yes   Patient/family informed of [facility]'s ownership interest in [facility] and [facility], as well as of the fact that they are under no obligation to receive care at these facilities.  PASRR submitted to EDS on 11/19/16     PASRR number received on 11/19/16     Existing PASRR number confirmed on       FL2 transmitted to all facilities in geographic area requested by pt/family on 11/19/16     FL2 transmitted to all facilities within larger geographic area on       Patient informed that his/her managed care company has contracts with or will negotiate with certain facilities, including the following:        Yes   Patient/family informed of bed offers received.  Patient chooses bed at [facility]     Physician recommends and patient chooses bed at [facility]    Patient to be transferred to [facility] on  .  Patient to be transferred to facility by       Patient family notified on   of transfer.  Name of family member notified:        PHYSICIAN       Additional Comment:     _______________________________________________ Zettie Pho, LCSW 11/25/2016, 9:45 AM

## 2016-11-25 NOTE — Clinical Social Work Note (Signed)
The patient will discharge to [facility] Room 611 pending bowel movement and final clearance by wound care. The patient is aware and will be traveling by EMS. The facility has received the discharge summary, and the nurse can call report to [phone] and ask for hall 600. CSW will continue to follow pending additional discharge needs.  Santiago Bumpers, MSW, Latanya Presser [phone]

## 2016-11-25 NOTE — Progress Notes (Signed)
3 Days Post-Op   Subjective/Chief Complaint: Doing OK. Pain controlled with current regimen.   Objective: Vital signs in last 24 hours: Temp:  [98.3 F (36.8 C)-99.5 F (37.5 C)] 98.8 F (37.1 C) (11/10 0812) Pulse Rate:  [86-95] 95 (11/10 0812) Resp:  [16-20] 20 (11/10 0812) BP: (104-113)/(57-65) 104/57 (11/10 0812) SpO2:  [96 %-100 %] 100 % (11/10 0812) Last BM Date: 11/20/16  Intake/Output from previous day: 11/09 0701 - 11/10 0700 In: 720 [P.O.:720] Out: 1200 [Urine:1200] Intake/Output this shift: Total I/O In: 240 [P.O.:240] Out: -   General appearance: alert and no distress Extremities: Right BKA stump: warm, soft, no hematoma, flap viable Incision/Wound: dressing changed: incision- C/D/I, scant serosang drainage  Lab Results:  Recent Labs    11/23/16 0337  WBC 8.9  HGB 8.0*  HCT 25.8*  PLT 470*   BMET Recent Labs    11/23/16 0337  NA 136  K 4.1  CL 106  CO2 22  GLUCOSE 121*  BUN 9  CREATININE 0.88  CALCIUM 8.2*   PT/INR No results for input(s): LABPROT, INR in the last 72 hours. ABG No results for input(s): PHART, HCO3 in the last 72 hours.  Invalid input(s): PCO2, PO2  Studies/Results: No results found.  Anti-infectives: Anti-infectives (From admission, onward)   Start     Dose/Rate Route Frequency Ordered Stop   11/23/16 1130  amoxicillin-clavulanate (AUGMENTIN) 875-125 MG per tablet 1 tablet     1 tablet Oral Every 12 hours 11/23/16 1126 11/27/16 0959   11/15/16 0000  ceFAZolin (ANCEF) 2 g in dextrose 5 % 100 mL IVPB  Status:  Discontinued     2 g 240 mL/hr over 30 Minutes Intravenous  Once 11/14/16 1211 11/15/16 1250   11/14/16 1500  vancomycin (VANCOCIN) 1,250 mg in sodium chloride 0.9 % 250 mL IVPB  Status:  Discontinued     1,250 mg 166.7 mL/hr over 90 Minutes Intravenous Every 18 hours 11/14/16 1400 11/20/16 0633   11/14/16 1200  ceFAZolin (ANCEF) 2 g in dextrose 5 % 100 mL IVPB  Status:  Discontinued     2 g 240 mL/hr over  30 Minutes Intravenous  Once 11/14/16 1140 11/14/16 1211   11/13/16 2100  vancomycin (VANCOCIN) IVPB 1000 mg/200 mL premix  Status:  Discontinued     1,000 mg 200 mL/hr over 60 Minutes Intravenous Every 18 hours 11/13/16 1006 11/14/16 1400   11/13/16 0300  vancomycin (VANCOCIN) 1,250 mg in sodium chloride 0.9 % 250 mL IVPB  Status:  Discontinued     1,250 mg 166.7 mL/hr over 90 Minutes Intravenous Every 24 hours 11/12/16 1903 11/13/16 1006   11/12/16 2200  piperacillin-tazobactam (ZOSYN) IVPB 3.375 g  Status:  Discontinued     3.375 g 12.5 mL/hr over 240 Minutes Intravenous Every 8 hours 11/12/16 1903 11/23/16 1126   11/12/16 1500  piperacillin-tazobactam (ZOSYN) IVPB 3.375 g     3.375 g 100 mL/hr over 30 Minutes Intravenous  Once 11/12/16 1455 11/12/16 1533   11/12/16 1500  vancomycin (VANCOCIN) IVPB 1000 mg/200 mL premix     1,000 mg 200 mL/hr over 60 Minutes Intravenous  Once 11/12/16 1455 11/12/16 1634      Assessment/Plan: s/p Procedure(s): AMPUTATION BELOW KNEE (Right) OK to D/C to [facility] per Medicine  Change BKA dressing daily- Xeroform, 4x4, kerlix and ACE Follow up with Vascular surgery in 2-3 weeks  LOS: 13 days    Jamesetta So A 11/25/2016

## 2016-11-25 NOTE — Progress Notes (Signed)
Physical Therapy Treatment Patient Details Name: Tristan [name] MRN: [id number] DOB: 06/26/1945 Today's Date: 11/25/2016    History of Present Illness 71 y.o. male who was admitted 10/28 with a history of iron deficiency anemia who was admitted with symptomatic anemia and right lower extremity cellulitis.  Blood transfusion 11/2, RLE revascularization 10/31. Pt. underwent a BKA on 11/22/2016.    PT Comments    Pt in recliner upon arrival.  Nursing in room giving medications.  Pt stated he slept in chair all night long.  Declined soreness in bottom or back. Stated it is generally more comfortable than the bed.  Discussed at length pressure relief strategies.  Generally good LE position in knee extension with pillow support.  Pt did participate in seated isometric exercises for pressure reduction with glut sets.  Also participated in minimal quad sets and ankle pumps (L).  Pt declined mobility or offer to return to bed at this time.  Session focused on education primarily due to declining mobility training.  Pt voiced understanding.  Anticipate discharge to rehab today.   Follow Up Recommendations  SNF     Equipment Recommendations  Rolling walker with 5" wheels    Recommendations for Other Services       Precautions / Restrictions Precautions Precautions: Fall Restrictions Weight Bearing Restrictions: Yes RLE Weight Bearing: Non weight bearing Other Position/Activity Restrictions: RLE    Mobility  Bed Mobility               General bed mobility comments: in recliner  Transfers                 General transfer comment: refused  Ambulation/Gait                 Stairs            Wheelchair Mobility    Modified Rankin (Stroke Patients Only)       Balance                                            Cognition Arousal/Alertness: Awake/alert Behavior During Therapy: WFL for tasks assessed/performed Overall Cognitive  Status: Within Functional Limits for tasks assessed                                        Exercises Other Exercises Other Exercises: Education regarding therapy and rehab progression and expectation. Other Exercises: Education regarding pressure relief and isometric exercises.  Reviewed knee prositioning and ROM    General Comments        Pertinent Vitals/Pain Pain Score: 8  Pain Descriptors / Indicators: Sore;Operative site guarding Pain Intervention(s): Premedicated before session    Home Living                      Prior Function            PT Goals (current goals can now be found in the care plan section) Progress towards PT goals: Progressing toward goals    Frequency    7X/week      PT Plan Current plan remains appropriate    Co-evaluation              AM-PAC PT "6 Clicks" Daily Activity  Outcome Measure  Difficulty turning over  in bed (including adjusting bedclothes, sheets and blankets)?: A Little Difficulty moving from lying on back to sitting on the side of the bed? : A Little Difficulty sitting down on and standing up from a chair with arms (e.g., wheelchair, bedside commode, etc,.)?: Unable Help needed moving to and from a bed to chair (including a wheelchair)?: A Lot Help needed walking in hospital room?: Total Help needed climbing 3-5 steps with a railing? : Total 6 Click Score: 11    End of Session   Activity Tolerance: Patient limited by pain Patient left: in chair;with chair alarm set;with call bell/phone within reach Nurse Communication: Mobility status Pain - Right/Left: Right Pain - part of body: Leg     Time: 1003-4961 PT Time Calculation (min) (ACUTE ONLY): 14 min  Charges:  $Therapeutic Activity: 8-22 mins                    G Codes:       Chesley Noon, PTA 11/25/16, 10:17 AM

## 2016-11-25 NOTE — Progress Notes (Addendum)
Report given to [facility] . Pt ready to be transferred, Vascular notes printed and put in packet. Clothing packed and sent with resident. Wheel chair here patient stated taht son will pickup note put on chair.

## 2016-12-14 ENCOUNTER — Telehealth: Payer: Self-pay | Admitting: *Deleted

## 2016-12-14 ENCOUNTER — Other Ambulatory Visit: Payer: Self-pay | Admitting: Hematology and Oncology

## 2016-12-14 ENCOUNTER — Inpatient Hospital Stay: Payer: Medicare Other | Attending: Hematology and Oncology | Admitting: Hematology and Oncology

## 2016-12-14 DIAGNOSIS — E538 Deficiency of other specified B group vitamins: Secondary | ICD-10-CM | POA: Insufficient documentation

## 2016-12-14 DIAGNOSIS — D500 Iron deficiency anemia secondary to blood loss (chronic): Secondary | ICD-10-CM

## 2016-12-14 NOTE — Telephone Encounter (Signed)
Called patient to inquire as to why he did not keep his hospital follow up appointment today.  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 He said he is going to be discharged tomorrow.  I gave him our number and asked him to call to reschedule his appointment once he gets home.  He verbalized understanding and states he will call.

## 2016-12-14 NOTE — Progress Notes (Deleted)
[name] day:  12/14/2016  Chief Complaint: Tristan [name] is a 71 y.o. male with iron deficiency anemia, B12 deficiency, and reactive thrombocytosis who is seen for assessment after interval hospitalization.  HPI:  The patient was admitted to [facility] from 06/08/2016 - 06/12/2016.  He presented with symptomatic anemia, acute renal insufficiency, NSTEMI, and elevated liver function tests. He described fatigue and shortness of breath. Microcytic anemia developed in the past 2 years.  Hemoglobin was 3.7 with a hematocrit of 15.3 and an MCV of 61.6.  Peripheral smearrevealed microcytic anemia and neutrophilia. There were no schistocytes.  Diet appeared good. He had ice pica. He had never had a colonoscopy or EGD.   Hemoglobin improved from 3.7 to 7.6 after 4 units of PRBCs.   Work-up on admissionconfirmediron deficiency anemia(ferritin 7; iron saturation 7%). Normal labsincluded: B12, folate, TSH, HIV testing, acute hepatitis panel, hepatitis B core antibody, Coombs, and SPEP. Free light chain ratio was slightly elevated (2.09) of unclear significance. There was no evidence of hemolysis. Stools were guaiac negative.  Plan was for outpatient EGD and colonoscopy.  His elevated creatinine was felt secondary to pre-renal azotemia. Elevated LFTs were felt secondary to shock liver or hepatitis. Elevated troponins secondary to ischemia induced by severe anemia. Urinalysis suggests potential source of infection or source of blood loss. Lactic acid was initially elevated (14.5).  Echo revealed severe aortic stenosis with an EF of 25%.  He was discharged on oral iron.  Hematocrit was 24.3, hemoglobin 7.7, and MCV 66.7.  He was to follow-up in the oncology clinic for possible IV iron.  He did not return for follow-up.  He states that he followed up with cardiology.  He was unsure if he took oral iron.  He states that he "took what they gave  me".  He presented [facility] ER on 11/12/2016 with acute pain in the right leg and foot with inability to walk.  He had a fever.  He denies any melena, hematochezia, or hematuria.  Labs revealed a hematocrit of 18.5, hemoglobin 5.3, MCV 57.9, platelets 798,000, WBC 10,800 with an ANC of 9200.  Creatinine was 1.84 (stable).  Albumen was 2.9 with a bilirubin of 1.9.  SGOT and SGPT were normal.  Ferritin was 30 with an iron saturation of 3% and a TIBC of 259.  Sed rate was 107.  Patient was admitted from 11/12/2016 - 11/24/2016 with gangrene of the right lower extremity.  He stated that 3-4 days prior to admission, his appetite decreased. He then developed nausea, vomiting, and diarrhea. He denied any melena, hematochezia, hematuria or hematemesis.  He denies any significant weight loss, but commented that over the past 2 years, he has gained about 30-40 pounds.  He felt that his diet had been good. He ate little red meat, but mostly chicken. He had ice pica for proximally one year.  He noted fatigue.  He was seen in consultation for anemia.  He received 5 units of PRBCs (1 unit- 10/28, 2 units- 10/29, 1 unit- 10/30, and 1 unit-11/02) with improvement of hemoglobin from 5.3 to 7.8. He was felt to be dry on admission (BUN 31 and Cr 1.84) and initial hemoglobin may have been lower.  He was felt to have reactive thrombocytosis.  Platelet count improved from 718,000 to 470,000.  CBC on 11/23/2016 revealed a hematocrit of 25.8, hemoglobin 8.0, and MCV 73.8.  EGD on 11/14/2016 revealed a normal esophagus, gastritis, texture changed mucosa  in the greater curvature (biopsied), and normal duodenum.  Colonoscopy on 11/14/2016 revealed diverticulosis in the sigmoid colon and in the descending colon.  During the interim,   Past Medical History:  Diagnosis Date  . Hypertension     Past Surgical History:  Procedure Laterality Date  . AMPUTATION Right 11/22/2016   Procedure: AMPUTATION BELOW KNEE;  Surgeon:  Algernon Huxley, MD;  Location: [facility] ORS;  Service: Vascular;  Laterality: Right;  . LOWER EXTREMITY ANGIOGRAPHY Right 11/15/2016   Procedure: Lower Extremity Angiography;  Surgeon: Algernon Huxley, MD;  Location: [facility];  Service: Cardiovascular;  Laterality: Right;    Family History  Problem Relation Age of Onset  . Hypertension Brother     Social History:  reports that he quit smoking about 37 years ago. he has never used smokeless tobacco. He reports that he uses drugs. Drug: Marijuana. He reports that he does not drink alcohol.  The patient is accompanied by *** alone today.  Allergies: No Known Allergies  Current Medications: Current Outpatient Medications  Medication Sig Dispense Refill  . acetaminophen (TYLENOL) 325 MG tablet Take 2 tablets (650 mg total) every 6 (six) hours as needed by mouth for mild pain (or Fever >/= 101).    Marland Kitchen allopurinol (ZYLOPRIM) 100 MG tablet Take 1 tablet (100 mg total) by mouth daily. 30 tablet 1  . aspirin EC 81 MG tablet Take 1 tablet (81 mg total) by mouth daily. 30 tablet 2  . atorvastatin (LIPITOR) 20 MG tablet Take 1 tablet (20 mg total) daily at 6 PM by mouth. 30 tablet 0  . bisacodyl (DULCOLAX) 10 MG suppository Place 1 suppository (10 mg total) daily as needed rectally for moderate constipation. 12 suppository 0  . carvedilol (COREG) 25 MG tablet Take 1 tablet (25 mg total) 2 (two) times daily with a meal by mouth. 60 tablet 0  . docusate sodium (COLACE) 100 MG capsule Take 1 capsule (100 mg total) 2 (two) times daily by mouth. 60 capsule 0  . feeding supplement, ENSURE ENLIVE, (ENSURE ENLIVE) LIQD Take 237 mLs 2 (two) times daily between meals by mouth. 60 Bottle 0  . ferrous sulfate 325 (65 FE) MG tablet Take 1 tablet (325 mg total) 2 (two) times daily with a meal by mouth. 60 tablet 0  . furosemide (LASIX) 20 MG tablet Take 20 mg by mouth daily.    . Multiple Vitamin (MULTIVITAMIN WITH MINERALS) TABS tablet Take 1 tablet daily by  mouth. 30 tablet 0  . ondansetron (ZOFRAN) 4 MG tablet Take 1 tablet (4 mg total) every 6 (six) hours as needed by mouth for nausea. 20 tablet 0  . oxyCODONE-acetaminophen (PERCOCET) 7.5-325 MG tablet Take 1 tablet every 4 (four) hours as needed by mouth for moderate pain. 30 tablet 0  . pantoprazole (PROTONIX) 40 MG tablet Take 1 tablet (40 mg total) 2 (two) times daily before a meal by mouth. 60 tablet 0   No current facility-administered medications for this visit.     Review of Systems:  GENERAL:  Feels good.  Active.  No fevers, sweats or weight loss. PERFORMANCE STATUS (ECOG):  *** HEENT:  No visual changes, runny nose, sore throat, mouth sores or tenderness. Lungs: No shortness of breath or cough.  No hemoptysis. Cardiac:  No chest pain, palpitations, orthopnea, or PND. GI:  No nausea, vomiting, diarrhea, constipation, melena or hematochezia. GU:  No urgency, frequency, dysuria, or hematuria. Musculoskeletal:  No back pain.  No joint pain.  No muscle tenderness. Extremities:  No pain or swelling. Skin:  No rashes or skin changes. Neuro:  No headache, numbness or weakness, balance or coordination issues. Endocrine:  No diabetes, thyroid issues, hot flashes or night sweats. Psych:  No mood changes, depression or anxiety. Pain:  No focal pain. Review of systems:  All other systems reviewed and found to be negative.  Physical Exam: There were no vitals taken for this visit. GENERAL:  Well developed, well nourished, **man sitting comfortably in the exam room in no acute distress. MENTAL STATUS:  Alert and oriented to person, place and time. HEAD:  *** hair.  Normocephalic, atraumatic, face symmetric, no Cushingoid features. EYES:  *** eyes.  Pupils equal round and reactive to light and accomodation.  No conjunctivitis or scleral icterus. ENT:  Oropharynx clear without lesion.  Tongue normal. Mucous membranes moist.  RESPIRATORY:  Clear to auscultation without rales, wheezes or  rhonchi. CARDIOVASCULAR:  Regular rate and rhythm without murmur, rub or gallop. ABDOMEN:  Soft, non-tender, with active bowel sounds, and no hepatosplenomegaly.  No masses. SKIN:  No rashes, ulcers or lesions. EXTREMITIES: No edema, no skin discoloration or tenderness.  No palpable cords. LYMPH NODES: No palpable cervical, supraclavicular, axillary or inguinal adenopathy  NEUROLOGICAL: Unremarkable. PSYCH:  Appropriate.   No visits with results within 3 Day(s) from this visit.  Latest known visit with results is:  Admission on 11/12/2016, Discharged on 11/25/2016  No results displayed because visit has over 200 results.      Assessment:  Izzy Abubakar Crispo is a 71 y.o. male with iron deficiency anemia, reactive thrombocytosis, and B12 deficiency. Per his history, diet is normal. However, his albumen is low. He denies any melena, hematochezia or hematuria.  Work-upduring his 06/08/2016 - 06/12/2016 [facility] admissionconfirmediron deficiency anemia(ferritin 7; iron saturation 7%). Normal labsincluded: B12, folate, TSH, HIV testing, acute hepatitis panel, hepatitis B core antibody, Coombs, and SPEP. Free light chain ratio was slightly elevated (2.09) of unclear significance. Retic was 1.6% (low). Peripheral smear revealed no schistocytes. There was noevidence of hemolysis. Stools were guaiac negative. He received 4 units of PRBCs.  Work-up during his 11/12/2016 - 11/24/2016 [facility] admission revealed recurrent anemia (hemoglobin 5.3) and reactive thrombocytosis (718,000).  He received 5 units of PRBCs (1 unit- 10/28, 2 units- 10/29, 1 unit- 10/30, and 1 unit-11/02) with improvement of hemoglobin to 7.8.  Ferritin was 30 (falsely elevated secondary to high sed rate (107).  Iron saturation was 3% (low). Retic was 1.8% (inappropriately low for level of anemia). LDH was normal. B12was 269 (borderline).  Methylmalonic acid was 530 (elevated) on 11/19/2016.  Folate was normal.  ANA is +   (SSA/Ro 4.0).  Repeat SPEP was negative.  He received 5 units of PRBCs.  EGD on 11/14/2016 revealed a normal esophagus, gastritis, texture changed mucosa in the greater curvature (biopsied), and normal duodenum.  Colonoscopy on 11/14/2016 revealed diverticulosis in the sigmoid colon and in the descending colon.  He has B12 deficiency. B12was 269 (borderline) on 11/12/2016.  Methylmalonic acid was 530 (elevated) on 11/19/2016.  Folate was 10.6 on 11/14/2016.  He has a history of right lower extremity gangrene.  He underwent lower extremity angiogram and extensive RLE revascularization on 11/15/2016.  He was discharged on aspirin and Plavix.  Symptomatically,  Plan: 1.  Labs today:  CBC with diff, retic, ferritin, iron studies, sed rate, folate. 2.  Discuss management of iron deficiency. 3.  Discuss management of B12 deficiency. 4.  Discuss referral to rheumatology (+  SSA/Ro). [place], MD  12/14/2016, 4:28 AM   I saw and evaluated the patient, participating in the key portions of the service and reviewing pertinent diagnostic studies and records.  I reviewed the nurse practitioner's note and agree with the findings and the plan.  The assessment and plan were discussed with the patient.  Additional diagnostic studies of *** are needed to clarify *** and would change the clinical management.  A few ***multiple questions were asked by the patient and answered.   Nolon Stalls, MD 12/14/2016,4:28 AM

## 2016-12-15 ENCOUNTER — Telehealth: Payer: Self-pay | Admitting: *Deleted

## 2016-12-15 ENCOUNTER — Telehealth: Payer: Self-pay

## 2016-12-15 NOTE — Telephone Encounter (Signed)
-----   Message from Lucilla Lame, MD sent at 11/16/2016  1:23 PM EDT ----- We need to find out if this patient is on a PPI and if he is he should stop it for 2-3 weeks and have his H. pylori stool antigen checked at that time. If positive he should be treated.

## 2016-12-15 NOTE — Telephone Encounter (Signed)
Not been able to reach pt by phone. WIll mail letter request a call back.

## 2016-12-15 NOTE — Telephone Encounter (Signed)
Tried to call patient to Resched his appt

## 2017-01-11 ENCOUNTER — Other Ambulatory Visit (INDEPENDENT_AMBULATORY_CARE_PROVIDER_SITE_OTHER): Payer: Self-pay | Admitting: Vascular Surgery

## 2017-01-11 DIAGNOSIS — I739 Peripheral vascular disease, unspecified: Secondary | ICD-10-CM

## 2017-01-12 ENCOUNTER — Encounter (INDEPENDENT_AMBULATORY_CARE_PROVIDER_SITE_OTHER): Payer: Medicare Other

## 2017-01-12 ENCOUNTER — Ambulatory Visit (INDEPENDENT_AMBULATORY_CARE_PROVIDER_SITE_OTHER): Payer: Medicare Other | Admitting: Vascular Surgery

## 2017-02-13 ENCOUNTER — Ambulatory Visit (INDEPENDENT_AMBULATORY_CARE_PROVIDER_SITE_OTHER): Payer: Medicare Other | Admitting: Vascular Surgery

## 2017-02-13 ENCOUNTER — Encounter (INDEPENDENT_AMBULATORY_CARE_PROVIDER_SITE_OTHER): Payer: Medicare Other

## 2017-06-16 DEATH — deceased

## 2019-01-27 IMAGING — DX DG CHEST 1V PORT
1 series · 1 of 1 positions shown · non-contrast
Comparison: Single-view of the chest 06/08/2016.

CLINICAL DATA: Weakness and shortness of breath for 3 days.

EXAM:
PORTABLE CHEST 1 VIEW

[chest ap]
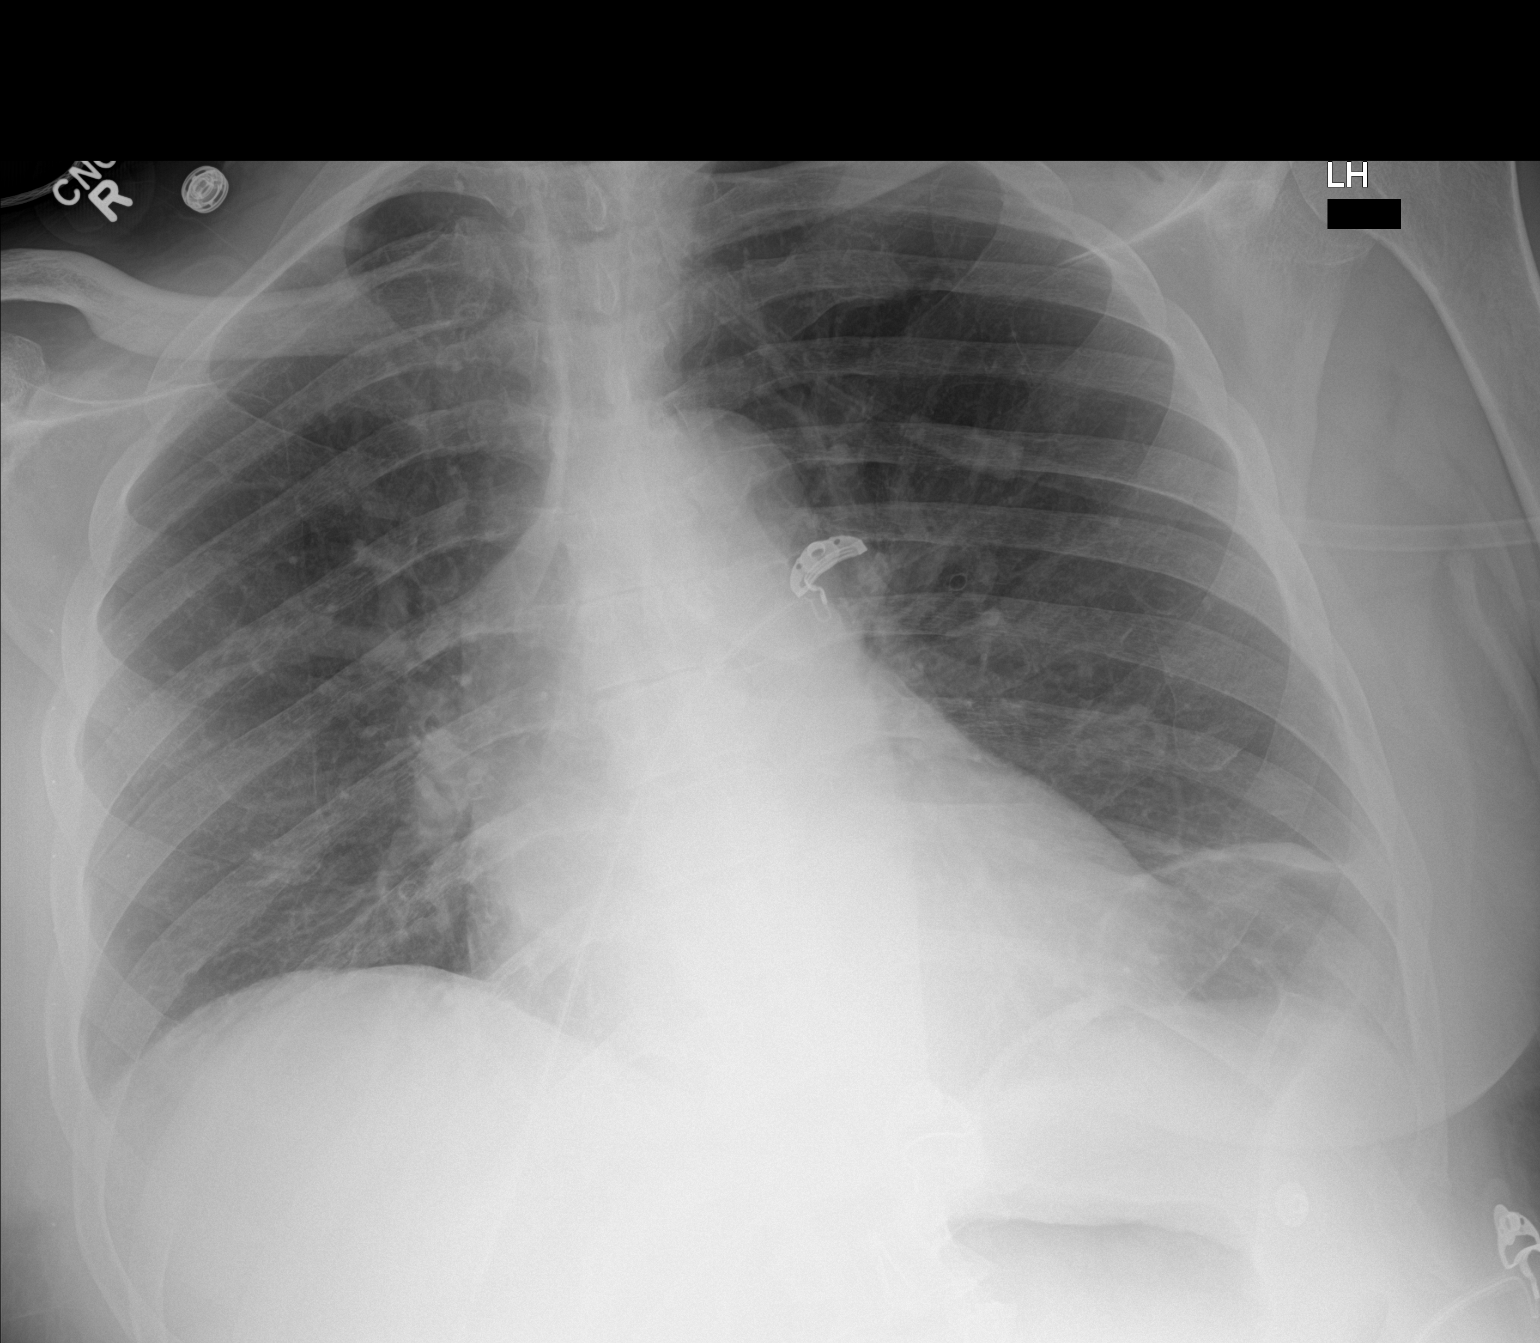

[1 of 1 positions shown; findings below may reference images not displayed]

FINDINGS: The lungs are clear. Heart size is normal. No pneumothorax or
pleural effusion. No acute bony abnormality.
IMPRESSION: No acute disease.
